# Patient Record
Sex: Female | Born: 1935
Health system: Southern US, Community
[De-identification: ages and names within clinical notes are randomized; demographics above are authoritative.]

## PROBLEM LIST (undated history)

## (undated) DIAGNOSIS — E119 Type 2 diabetes mellitus without complications: Secondary | ICD-10-CM

## (undated) DIAGNOSIS — E785 Hyperlipidemia, unspecified: Secondary | ICD-10-CM

## (undated) DIAGNOSIS — E079 Disorder of thyroid, unspecified: Secondary | ICD-10-CM

## (undated) DIAGNOSIS — I4891 Unspecified atrial fibrillation: Secondary | ICD-10-CM

## (undated) DIAGNOSIS — I10 Essential (primary) hypertension: Secondary | ICD-10-CM

## (undated) HISTORY — PX: PACEMAKER INSERTION: SHX728

## (undated) HISTORY — PX: ABDOMINAL HYSTERECTOMY: SHX81

---

## 1997-12-10 ENCOUNTER — Encounter: Payer: Self-pay | Admitting: Obstetrics & Gynecology

## 1997-12-10 ENCOUNTER — Ambulatory Visit (HOSPITAL_COMMUNITY): Admission: RE | Admit: 1997-12-10 | Discharge: 1997-12-10 | Payer: Self-pay | Admitting: Obstetrics & Gynecology

## 1998-11-24 ENCOUNTER — Other Ambulatory Visit: Admission: RE | Admit: 1998-11-24 | Discharge: 1998-11-24 | Payer: Self-pay | Admitting: Obstetrics & Gynecology

## 1998-12-13 ENCOUNTER — Ambulatory Visit (HOSPITAL_COMMUNITY): Admission: RE | Admit: 1998-12-13 | Discharge: 1998-12-13 | Payer: Self-pay | Admitting: Obstetrics & Gynecology

## 1998-12-13 ENCOUNTER — Encounter: Payer: Self-pay | Admitting: Obstetrics & Gynecology

## 1999-12-12 ENCOUNTER — Other Ambulatory Visit: Admission: RE | Admit: 1999-12-12 | Discharge: 1999-12-12 | Payer: Self-pay | Admitting: Obstetrics & Gynecology

## 1999-12-14 ENCOUNTER — Ambulatory Visit (HOSPITAL_COMMUNITY): Admission: RE | Admit: 1999-12-14 | Discharge: 1999-12-14 | Payer: Self-pay | Admitting: Obstetrics & Gynecology

## 1999-12-14 ENCOUNTER — Encounter: Payer: Self-pay | Admitting: Obstetrics & Gynecology

## 1999-12-18 ENCOUNTER — Encounter: Payer: Self-pay | Admitting: Obstetrics & Gynecology

## 1999-12-18 ENCOUNTER — Encounter: Admission: RE | Admit: 1999-12-18 | Discharge: 1999-12-18 | Payer: Self-pay | Admitting: Obstetrics & Gynecology

## 2000-12-16 ENCOUNTER — Ambulatory Visit (HOSPITAL_COMMUNITY): Admission: RE | Admit: 2000-12-16 | Discharge: 2000-12-16 | Payer: Self-pay | Admitting: Obstetrics & Gynecology

## 2000-12-16 ENCOUNTER — Encounter: Payer: Self-pay | Admitting: Obstetrics & Gynecology

## 2000-12-26 ENCOUNTER — Other Ambulatory Visit: Admission: RE | Admit: 2000-12-26 | Discharge: 2000-12-26 | Payer: Self-pay | Admitting: Obstetrics & Gynecology

## 2002-04-01 ENCOUNTER — Other Ambulatory Visit: Admission: RE | Admit: 2002-04-01 | Discharge: 2002-04-01 | Payer: Self-pay | Admitting: Obstetrics & Gynecology

## 2002-04-02 ENCOUNTER — Ambulatory Visit (HOSPITAL_COMMUNITY): Admission: RE | Admit: 2002-04-02 | Discharge: 2002-04-02 | Payer: Self-pay | Admitting: Obstetrics & Gynecology

## 2002-04-02 ENCOUNTER — Encounter: Payer: Self-pay | Admitting: Obstetrics & Gynecology

## 2003-04-05 ENCOUNTER — Other Ambulatory Visit: Admission: RE | Admit: 2003-04-05 | Discharge: 2003-04-05 | Payer: Self-pay | Admitting: *Deleted

## 2003-04-06 ENCOUNTER — Ambulatory Visit (HOSPITAL_COMMUNITY): Admission: RE | Admit: 2003-04-06 | Discharge: 2003-04-06 | Payer: Self-pay | Admitting: Obstetrics & Gynecology

## 2004-04-05 ENCOUNTER — Other Ambulatory Visit: Admission: RE | Admit: 2004-04-05 | Discharge: 2004-04-05 | Payer: Self-pay | Admitting: Obstetrics & Gynecology

## 2004-04-11 ENCOUNTER — Ambulatory Visit (HOSPITAL_COMMUNITY): Admission: RE | Admit: 2004-04-11 | Discharge: 2004-04-11 | Payer: Self-pay | Admitting: Obstetrics & Gynecology

## 2004-04-28 ENCOUNTER — Encounter: Admission: RE | Admit: 2004-04-28 | Discharge: 2004-04-28 | Payer: Self-pay | Admitting: Obstetrics & Gynecology

## 2005-04-13 ENCOUNTER — Ambulatory Visit (HOSPITAL_COMMUNITY): Admission: RE | Admit: 2005-04-13 | Discharge: 2005-04-13 | Payer: Self-pay | Admitting: Obstetrics & Gynecology

## 2005-10-15 ENCOUNTER — Ambulatory Visit (HOSPITAL_COMMUNITY): Admission: RE | Admit: 2005-10-15 | Discharge: 2005-10-16 | Payer: Self-pay | Admitting: Obstetrics & Gynecology

## 2006-04-18 ENCOUNTER — Ambulatory Visit (HOSPITAL_COMMUNITY): Admission: RE | Admit: 2006-04-18 | Discharge: 2006-04-18 | Payer: Self-pay | Admitting: Obstetrics & Gynecology

## 2007-04-21 ENCOUNTER — Ambulatory Visit (HOSPITAL_COMMUNITY): Admission: RE | Admit: 2007-04-21 | Discharge: 2007-04-21 | Payer: Self-pay | Admitting: Obstetrics & Gynecology

## 2008-04-21 ENCOUNTER — Ambulatory Visit (HOSPITAL_COMMUNITY): Admission: RE | Admit: 2008-04-21 | Discharge: 2008-04-21 | Payer: Self-pay | Admitting: Obstetrics & Gynecology

## 2009-08-26 ENCOUNTER — Ambulatory Visit (HOSPITAL_COMMUNITY): Admission: RE | Admit: 2009-08-26 | Discharge: 2009-08-26 | Payer: Self-pay | Admitting: Obstetrics & Gynecology

## 2010-09-08 NOTE — Discharge Summary (Signed)
NAME:  Kerry Carlson, Kerry Carlson NO.:  [id number]   MEDICAL RECORD NO.:  [id number]          PATIENT TYPE:  OIB   LOCATION:  9318                          FACILITY:  [facility]   PHYSICIAN:  Freddy Finner, M.D.   DATE OF BIRTH:  02/26/1926   DATE OF ADMISSION:  10/15/2005  DATE OF DISCHARGE:  10/16/2005                                 DISCHARGE SUMMARY   DISCHARGE DIAGNOSES:  1.  Cystocele with prolapse.  2.  Rectocele.  3.  Vaginal vault descensus.   OPERATIVE PROCEDURE:  Anterior and posterior colporrhaphy, sacral spinous  ligament suspension of vagina.   INTRAOPERATIVE AND POSTOPERATIVE COMPLICATIONS:  None.   DISPOSITION:  The patient is in excellent condition on the evening of the  first postoperative day.  She has voided approximately 300 mL with each  voiding with approximately 50-75 mL of residual with each of approximately 4-  6 voiding episodes.  Her suprapubic catheter was removed.  She has had  progressively increasing physical activity but no heavy lifting or vaginal  entry.  She is to call for fever or heavy vaginal bleeding.  She is to  return to the office in two weeks for postoperative follow-up.  She is to  take Percocet 5/325 as needed for postoperative pain unrelieved by Tylenol  or ibuprofen.  She is to take a regular diet.  She is to resume all of her  preoperative medications.   Details of the present illness, past history, family history, review of  systems and physical exam recorded in the admission note and/or in the  operative note.  The patient's physical findings are significant for  prolapse cystocele, vaginal vault descensus and rectocele.   LABORATORY DATA:  During this admission includes the admission hemoglobin of  13.6, postoperative hemoglobin of 10.4.  Admission pro time and PTT were  normal. Chem-7 was normal.  Urinalysis was normal.  EKG sinus bradycardia,  otherwise normal.   HOSPITAL COURSE:  The patient was admitted on the morning  of surgery.  She  was treated perioperatively with PAS compression hose.  She was given an IV  antibiotic preoperatively.  The above described surgical procedure was  accomplished without difficulty.  The patient made excellent rapid recovery  including voiding function.  By the evening of the first postoperative day,  she was ambulating without difficulty.  She was having adequate bowel and  bladder function.  She was voiding with adequate volumes and minimal  residual.  She was discharged home with disposition as noted above.      Freddy Finner, M.D.  Electronically Signed     WRN/MEDQ  D:  10/16/2005  T:  10/17/2005  Job:  [id number]

## 2010-09-08 NOTE — H&P (Signed)
NAME:  Kerry Carlson, Kerry Carlson NO.:  [id number]   MEDICAL RECORD NO.:  [id number]          PATIENT TYPE:  AMB   LOCATION:  SDC                           FACILITY:  [facility]   PHYSICIAN:  Freddy Finner, M.D.   DATE OF BIRTH:  [birth year]   DATE OF ADMISSION:  10/15/2005  DATE OF DISCHARGE:                                HISTORY & PHYSICAL   ADMITTING DIAGNOSES:  1.  Third degree cystocele with prolapse.  2.  Second degree rectocele, symptomatic.   PRESENT ILLNESS:  Patient is a 77 year old white widowed female, gravida 2,  para 2, who has been followed in my office since 1994.  She was noted to  have cystocele at her original examination at that time, but in the very  recent past this has progressively worsened to the point that she now has  prolapse through the vaginal introitus.  This has become symptomatic, and  she is very active for her age, and this is compromising her day-to-day  activities.  In addition, on examination in the office, she has a second  degree rectocele and some marked descensus of the vaginal vault.  She is now  admitted for surgical intervention, specifically anterior and posterior  colporrhaphy, sacrospinous ligament suspension of vagina.  She had a  hysterectomy in 1970.   Her current review of systems is otherwise negative.  There are no  cardiopulmonary, GI or GU complaints.   PAST MEDICAL HISTORY:  Remarkable for:  1.  Hypothyroid, medication for which she is taking Synthroid since the age      of 72.  Her current dose is 125 mcg.  2.  She also is known to be mildly hypertensive for which she takes      __________ .  3.  She also takes estrogen replacement therapy, specifically Premarin 0.625      mg a day and she has been using Premarin vaginal cream the week prior to      her surgery.   There are no other known significant medical illnesses.  HER ONLY KNOWN  ALLERGY IS TO PENICILLIN.  Previous surgical procedures, other than the  hysterectomy, are none.  She had 2 vaginal deliveries for her children.  She  does state that she had a history of endometriosis many years ago.   FAMILY HISTORY:  Noncontributory.   PHYSICAL EXAMINATION:  HEENT is grossly within normal limits.  The thyroid  gland is not palpably enlarged to my examination.  Blood pressure in the  office 140/82.  CHEST:  Clear to auscultation.  HEART:  Normal sinus rhythm without murmurs, rubs or gallops.  BREASTS:  Exam is considered to be normal.  No palpable masses.  No skin  change or retraction.  No nipple discharge.  (Most recent mammogram was  normal, in December 2006.)  ABDOMEN:  Soft and nontender without appreciable organomegaly or palpable  masses.  PELVIC:  Findings are as described in the present illness.  There is no  palpable rectal mass.  The bimanual exam reveals no palpable pelvic masses.  EXTREMITIES:  Without cyanosis, clubbing  or edema.   ASSESSMENT:  1.  Cystocele with complete prolapse.  2.  Second degree rectocele.  3.  Uterine vault descensus.   PLAN:  Vaginal anterior and posterior repair with sacrospinous ligament  suspension.      Freddy Finner, M.D.  Electronically Signed     WRN/MEDQ  D:  10/11/2005  T:  10/11/2005  Job:  [id number]

## 2010-09-08 NOTE — Op Note (Signed)
NAME:  Kerry Carlson, FLANNIGAN NO.:  [id number]   MEDICAL RECORD NO.:  [id number]          PATIENT TYPE:  AMB   LOCATION:  SDC                           FACILITY:  [facility]   PHYSICIAN:  Freddy Finner, M.D.   DATE OF BIRTH:  [birth year]   DATE OF PROCEDURE:  10/15/2005  DATE OF DISCHARGE:                                 OPERATIVE REPORT   PREOPERATIVE DIAGNOSES:  Cystocele with prolapse, rectocele, vaginal vault  descensus.   POSTOPERATIVE DIAGNOSES:  Cystocele with prolapse, rectocele, vaginal vault  descensus.   OPERATIVE PROCEDURE:  Anterior and posterior vaginal repair, sacrospinous  ligament suspension of vagina.   SURGEON:  Freddy Finner, M.D.   ANESTHESIA:  General.   ESTIMATED INTRAOPERATIVE BLOOD LOSS:  100 cc.   INTRAOPERATIVE COMPLICATIONS:  None.   HISTORY OF PRESENT ILLNESS:  Recorded in admission note.  The patient was  admitted on the morning of surgery.  She was placed in Perios hose.  She was  given an antibiotic perioperatively.  She was brought to the operating room  and there placed under adequate general anesthesia, placed in the dorsal  lithotomy position using Allen stirrups system.  Betadine prep of perineum  and vagina was carried out in the standard fashion.  Sterile drapes were  applied.  Posterior weighted vaginal retractor was placed.  The mucosa  anteriorly was grasped near the apex of the vagina in the midline with Allis  clamps.  An incision was made sharply in the midline mucosa with a scalpel.  Edges of the fundal mucosa were grasped with Allises and the incision was  developed to a distance of approximately 1 cm from the urethral meatus.  With very careful blunt and sharp dissection, the bladder and urogenital  fascia was separated from the mucosa.  Plication sutures of 0 Vicryl were  then used to approximate the vesicovaginal fascia to reduce and support the  cystocele.  Segments of mucosa were excised.  The mucosal incision was  closed with a running 2-0 Monocryl suture.  Allises were used to grasp the  fourchette on either side.  The pyramidal segment of skin was excised from  the perineal body with apex inferiorly.  The mucosa overlying the rectum was  elevated and midline incision made for a distance of approximately 8 cm.  With very careful sharp and blunt dissection, the mucosa was separated from  the rectum and perirectal tissues.  The sacrospinous ligament on the right  was then palpably identified using the Capio device and an absorbable  suture.  Then, a sacrospinous suture was placed and tacked to the vaginal  mucosa near the apex of the vagina.  Plication sutures were then used to  approximate the perirectal tissues in the midline and recreate a  rectovaginal septum.  The mucosa was closed for a distance of approximately  4 to 5 cm before tying the sacrospinous ligament suspension suture.  This  adequately elevated the vaginal vault.  Deep sutures of 0 Monocryl were used  in the perineal body to approximate the levators and close the perineal  body.  The mucosa was completely closed with running 0 Monocryl suture and  the perineum  closed in fashion similar to episiotomy.  Bladder was then filled with 300  cc of irrigating solution.  Suprapubic Bonnano catheter was then placed  without difficulty and anchored to the skin using 3-0 nylon sutures.  The  patient was then awakened and taken to the recovery room in good condition.      Freddy Finner, M.D.  Electronically Signed     WRN/MEDQ  D:  10/15/2005  T:  10/15/2005  Job:  [id number]

## 2010-09-19 ENCOUNTER — Other Ambulatory Visit (HOSPITAL_COMMUNITY): Payer: Self-pay | Admitting: Obstetrics & Gynecology

## 2010-09-19 DIAGNOSIS — Z1231 Encounter for screening mammogram for malignant neoplasm of breast: Secondary | ICD-10-CM

## 2010-09-28 ENCOUNTER — Ambulatory Visit (HOSPITAL_COMMUNITY)
Admission: RE | Admit: 2010-09-28 | Discharge: 2010-09-28 | Disposition: A | Discharge status: Home / Self Care | Payer: Medicare Other | Source: Ambulatory Visit | Attending: Obstetrics & Gynecology | Admitting: Obstetrics & Gynecology

## 2010-09-28 DIAGNOSIS — Z1231 Encounter for screening mammogram for malignant neoplasm of breast: Secondary | ICD-10-CM | POA: Insufficient documentation

## 2011-04-26 DIAGNOSIS — I10 Essential (primary) hypertension: Secondary | ICD-10-CM | POA: Diagnosis not present

## 2011-04-26 DIAGNOSIS — M1990 Unspecified osteoarthritis, unspecified site: Secondary | ICD-10-CM | POA: Diagnosis not present

## 2011-04-26 DIAGNOSIS — E039 Hypothyroidism, unspecified: Secondary | ICD-10-CM | POA: Diagnosis not present

## 2011-04-26 DIAGNOSIS — E785 Hyperlipidemia, unspecified: Secondary | ICD-10-CM | POA: Diagnosis not present

## 2011-10-10 DIAGNOSIS — N951 Menopausal and female climacteric states: Secondary | ICD-10-CM | POA: Diagnosis not present

## 2011-10-11 ENCOUNTER — Other Ambulatory Visit (HOSPITAL_COMMUNITY): Payer: Self-pay | Admitting: Obstetrics & Gynecology

## 2011-10-11 DIAGNOSIS — Z1231 Encounter for screening mammogram for malignant neoplasm of breast: Secondary | ICD-10-CM

## 2011-11-02 ENCOUNTER — Ambulatory Visit (HOSPITAL_COMMUNITY)
Admission: RE | Admit: 2011-11-02 | Discharge: 2011-11-02 | Disposition: A | Discharge status: Home / Self Care | Payer: Medicare Other | Source: Ambulatory Visit | Attending: Obstetrics & Gynecology | Admitting: Obstetrics & Gynecology

## 2011-11-02 DIAGNOSIS — Z1231 Encounter for screening mammogram for malignant neoplasm of breast: Secondary | ICD-10-CM | POA: Insufficient documentation

## 2011-11-07 DIAGNOSIS — Z961 Presence of intraocular lens: Secondary | ICD-10-CM | POA: Diagnosis not present

## 2011-11-07 DIAGNOSIS — H43819 Vitreous degeneration, unspecified eye: Secondary | ICD-10-CM | POA: Diagnosis not present

## 2011-11-07 DIAGNOSIS — H52209 Unspecified astigmatism, unspecified eye: Secondary | ICD-10-CM | POA: Diagnosis not present

## 2011-11-07 DIAGNOSIS — H04129 Dry eye syndrome of unspecified lacrimal gland: Secondary | ICD-10-CM | POA: Diagnosis not present

## 2011-11-26 DIAGNOSIS — I10 Essential (primary) hypertension: Secondary | ICD-10-CM | POA: Diagnosis not present

## 2011-11-26 DIAGNOSIS — E785 Hyperlipidemia, unspecified: Secondary | ICD-10-CM | POA: Diagnosis not present

## 2011-11-26 DIAGNOSIS — R7301 Impaired fasting glucose: Secondary | ICD-10-CM | POA: Diagnosis not present

## 2011-11-26 DIAGNOSIS — E039 Hypothyroidism, unspecified: Secondary | ICD-10-CM | POA: Diagnosis not present

## 2011-12-03 DIAGNOSIS — E785 Hyperlipidemia, unspecified: Secondary | ICD-10-CM | POA: Diagnosis not present

## 2011-12-03 DIAGNOSIS — I10 Essential (primary) hypertension: Secondary | ICD-10-CM | POA: Diagnosis not present

## 2011-12-03 DIAGNOSIS — E039 Hypothyroidism, unspecified: Secondary | ICD-10-CM | POA: Diagnosis not present

## 2011-12-03 DIAGNOSIS — Z0000 Encounter for general adult medical examination without abnormal findings: Secondary | ICD-10-CM | POA: Diagnosis not present

## 2012-05-16 DIAGNOSIS — R42 Dizziness and giddiness: Secondary | ICD-10-CM | POA: Diagnosis not present

## 2012-05-16 DIAGNOSIS — G8190 Hemiplegia, unspecified affecting unspecified side: Secondary | ICD-10-CM | POA: Diagnosis not present

## 2012-05-16 DIAGNOSIS — G9389 Other specified disorders of brain: Secondary | ICD-10-CM | POA: Diagnosis not present

## 2012-08-26 DIAGNOSIS — R319 Hematuria, unspecified: Secondary | ICD-10-CM | POA: Diagnosis not present

## 2012-08-26 DIAGNOSIS — N3000 Acute cystitis without hematuria: Secondary | ICD-10-CM | POA: Diagnosis not present

## 2012-08-26 DIAGNOSIS — N390 Urinary tract infection, site not specified: Secondary | ICD-10-CM | POA: Diagnosis not present

## 2012-08-26 DIAGNOSIS — R300 Dysuria: Secondary | ICD-10-CM | POA: Diagnosis not present

## 2012-09-11 DIAGNOSIS — E039 Hypothyroidism, unspecified: Secondary | ICD-10-CM | POA: Diagnosis not present

## 2012-09-11 DIAGNOSIS — E785 Hyperlipidemia, unspecified: Secondary | ICD-10-CM | POA: Diagnosis not present

## 2012-09-11 DIAGNOSIS — I10 Essential (primary) hypertension: Secondary | ICD-10-CM | POA: Diagnosis not present

## 2012-10-07 ENCOUNTER — Other Ambulatory Visit (HOSPITAL_COMMUNITY): Payer: Self-pay | Admitting: Internal Medicine

## 2012-10-07 DIAGNOSIS — Z1231 Encounter for screening mammogram for malignant neoplasm of breast: Secondary | ICD-10-CM

## 2012-10-07 DIAGNOSIS — Z124 Encounter for screening for malignant neoplasm of cervix: Secondary | ICD-10-CM | POA: Diagnosis not present

## 2012-11-04 ENCOUNTER — Ambulatory Visit (HOSPITAL_COMMUNITY)
Admission: RE | Admit: 2012-11-04 | Discharge: 2012-11-04 | Disposition: A | Discharge status: Home / Self Care | Payer: Medicare Other | Source: Ambulatory Visit | Attending: Internal Medicine | Admitting: Internal Medicine

## 2012-11-04 DIAGNOSIS — Z1231 Encounter for screening mammogram for malignant neoplasm of breast: Secondary | ICD-10-CM | POA: Insufficient documentation

## 2012-11-05 DIAGNOSIS — H52209 Unspecified astigmatism, unspecified eye: Secondary | ICD-10-CM | POA: Diagnosis not present

## 2012-11-05 DIAGNOSIS — H43819 Vitreous degeneration, unspecified eye: Secondary | ICD-10-CM | POA: Diagnosis not present

## 2012-11-05 DIAGNOSIS — Z961 Presence of intraocular lens: Secondary | ICD-10-CM | POA: Diagnosis not present

## 2012-11-05 DIAGNOSIS — H04129 Dry eye syndrome of unspecified lacrimal gland: Secondary | ICD-10-CM | POA: Diagnosis not present

## 2012-11-25 DIAGNOSIS — E039 Hypothyroidism, unspecified: Secondary | ICD-10-CM | POA: Diagnosis not present

## 2012-11-25 DIAGNOSIS — Z780 Asymptomatic menopausal state: Secondary | ICD-10-CM | POA: Diagnosis not present

## 2012-11-25 DIAGNOSIS — E785 Hyperlipidemia, unspecified: Secondary | ICD-10-CM | POA: Diagnosis not present

## 2012-11-25 DIAGNOSIS — R7301 Impaired fasting glucose: Secondary | ICD-10-CM | POA: Diagnosis not present

## 2012-11-25 DIAGNOSIS — I10 Essential (primary) hypertension: Secondary | ICD-10-CM | POA: Diagnosis not present

## 2012-12-01 DIAGNOSIS — M1710 Unilateral primary osteoarthritis, unspecified knee: Secondary | ICD-10-CM | POA: Diagnosis not present

## 2012-12-04 DIAGNOSIS — E039 Hypothyroidism, unspecified: Secondary | ICD-10-CM | POA: Diagnosis not present

## 2012-12-04 DIAGNOSIS — Z0000 Encounter for general adult medical examination without abnormal findings: Secondary | ICD-10-CM | POA: Diagnosis not present

## 2012-12-04 DIAGNOSIS — R7301 Impaired fasting glucose: Secondary | ICD-10-CM | POA: Diagnosis not present

## 2012-12-04 DIAGNOSIS — Z1331 Encounter for screening for depression: Secondary | ICD-10-CM | POA: Diagnosis not present

## 2012-12-04 DIAGNOSIS — I679 Cerebrovascular disease, unspecified: Secondary | ICD-10-CM | POA: Diagnosis not present

## 2012-12-04 DIAGNOSIS — I10 Essential (primary) hypertension: Secondary | ICD-10-CM | POA: Diagnosis not present

## 2012-12-04 DIAGNOSIS — E785 Hyperlipidemia, unspecified: Secondary | ICD-10-CM | POA: Diagnosis not present

## 2012-12-08 DIAGNOSIS — Z1212 Encounter for screening for malignant neoplasm of rectum: Secondary | ICD-10-CM | POA: Diagnosis not present

## 2013-01-05 DIAGNOSIS — M25569 Pain in unspecified knee: Secondary | ICD-10-CM | POA: Diagnosis not present

## 2013-04-09 DIAGNOSIS — M1710 Unilateral primary osteoarthritis, unspecified knee: Secondary | ICD-10-CM | POA: Diagnosis not present

## 2013-05-13 DIAGNOSIS — J209 Acute bronchitis, unspecified: Secondary | ICD-10-CM | POA: Diagnosis not present

## 2013-09-09 ENCOUNTER — Other Ambulatory Visit (HOSPITAL_COMMUNITY): Payer: Self-pay | Admitting: Internal Medicine

## 2013-09-09 DIAGNOSIS — Z1231 Encounter for screening mammogram for malignant neoplasm of breast: Secondary | ICD-10-CM

## 2013-11-02 DIAGNOSIS — N3945 Continuous leakage: Secondary | ICD-10-CM | POA: Diagnosis not present

## 2013-11-02 DIAGNOSIS — Z01419 Encounter for gynecological examination (general) (routine) without abnormal findings: Secondary | ICD-10-CM | POA: Diagnosis not present

## 2013-11-05 ENCOUNTER — Ambulatory Visit (HOSPITAL_COMMUNITY)
Admission: RE | Admit: 2013-11-05 | Discharge: 2013-11-05 | Disposition: A | Discharge status: Home / Self Care | Payer: Medicare Other | Source: Ambulatory Visit | Attending: Internal Medicine | Admitting: Internal Medicine

## 2013-11-05 DIAGNOSIS — Z1231 Encounter for screening mammogram for malignant neoplasm of breast: Secondary | ICD-10-CM | POA: Insufficient documentation

## 2013-11-09 DIAGNOSIS — H33329 Round hole, unspecified eye: Secondary | ICD-10-CM | POA: Diagnosis not present

## 2013-11-09 DIAGNOSIS — Z961 Presence of intraocular lens: Secondary | ICD-10-CM | POA: Diagnosis not present

## 2013-11-09 DIAGNOSIS — H04129 Dry eye syndrome of unspecified lacrimal gland: Secondary | ICD-10-CM | POA: Diagnosis not present

## 2013-11-09 DIAGNOSIS — H43819 Vitreous degeneration, unspecified eye: Secondary | ICD-10-CM | POA: Diagnosis not present

## 2013-12-01 DIAGNOSIS — I10 Essential (primary) hypertension: Secondary | ICD-10-CM | POA: Diagnosis not present

## 2013-12-01 DIAGNOSIS — E785 Hyperlipidemia, unspecified: Secondary | ICD-10-CM | POA: Diagnosis not present

## 2013-12-01 DIAGNOSIS — R7301 Impaired fasting glucose: Secondary | ICD-10-CM | POA: Diagnosis not present

## 2013-12-01 DIAGNOSIS — E039 Hypothyroidism, unspecified: Secondary | ICD-10-CM | POA: Diagnosis not present

## 2013-12-01 DIAGNOSIS — R82998 Other abnormal findings in urine: Secondary | ICD-10-CM | POA: Diagnosis not present

## 2013-12-03 DIAGNOSIS — H33009 Unspecified retinal detachment with retinal break, unspecified eye: Secondary | ICD-10-CM | POA: Diagnosis not present

## 2013-12-07 DIAGNOSIS — Z1212 Encounter for screening for malignant neoplasm of rectum: Secondary | ICD-10-CM | POA: Diagnosis not present

## 2013-12-08 DIAGNOSIS — I679 Cerebrovascular disease, unspecified: Secondary | ICD-10-CM | POA: Diagnosis not present

## 2013-12-08 DIAGNOSIS — M1990 Unspecified osteoarthritis, unspecified site: Secondary | ICD-10-CM | POA: Diagnosis not present

## 2013-12-08 DIAGNOSIS — I10 Essential (primary) hypertension: Secondary | ICD-10-CM | POA: Diagnosis not present

## 2013-12-08 DIAGNOSIS — R7301 Impaired fasting glucose: Secondary | ICD-10-CM | POA: Diagnosis not present

## 2013-12-08 DIAGNOSIS — Z0000 Encounter for general adult medical examination without abnormal findings: Secondary | ICD-10-CM | POA: Diagnosis not present

## 2013-12-08 DIAGNOSIS — N183 Chronic kidney disease, stage 3 unspecified: Secondary | ICD-10-CM | POA: Diagnosis not present

## 2013-12-08 DIAGNOSIS — E039 Hypothyroidism, unspecified: Secondary | ICD-10-CM | POA: Diagnosis not present

## 2013-12-08 DIAGNOSIS — E785 Hyperlipidemia, unspecified: Secondary | ICD-10-CM | POA: Diagnosis not present

## 2013-12-23 DIAGNOSIS — H43819 Vitreous degeneration, unspecified eye: Secondary | ICD-10-CM | POA: Diagnosis not present

## 2013-12-23 DIAGNOSIS — H33309 Unspecified retinal break, unspecified eye: Secondary | ICD-10-CM | POA: Diagnosis not present

## 2014-04-08 DIAGNOSIS — H33302 Unspecified retinal break, left eye: Secondary | ICD-10-CM | POA: Diagnosis not present

## 2014-04-08 DIAGNOSIS — H43813 Vitreous degeneration, bilateral: Secondary | ICD-10-CM | POA: Diagnosis not present

## 2014-10-12 ENCOUNTER — Other Ambulatory Visit (HOSPITAL_COMMUNITY): Payer: Self-pay | Admitting: Internal Medicine

## 2014-10-12 DIAGNOSIS — Z1231 Encounter for screening mammogram for malignant neoplasm of breast: Secondary | ICD-10-CM

## 2014-10-14 DIAGNOSIS — H43813 Vitreous degeneration, bilateral: Secondary | ICD-10-CM | POA: Diagnosis not present

## 2014-10-14 DIAGNOSIS — H33012 Retinal detachment with single break, left eye: Secondary | ICD-10-CM | POA: Diagnosis not present

## 2014-11-09 ENCOUNTER — Ambulatory Visit (HOSPITAL_COMMUNITY)
Admission: RE | Admit: 2014-11-09 | Discharge: 2014-11-09 | Disposition: A | Discharge status: Home / Self Care | Payer: Medicare Other | Source: Ambulatory Visit | Attending: Internal Medicine | Admitting: Internal Medicine

## 2014-11-09 DIAGNOSIS — Z1231 Encounter for screening mammogram for malignant neoplasm of breast: Secondary | ICD-10-CM | POA: Insufficient documentation

## 2014-11-10 DIAGNOSIS — N390 Urinary tract infection, site not specified: Secondary | ICD-10-CM | POA: Diagnosis not present

## 2014-11-10 DIAGNOSIS — Z1272 Encounter for screening for malignant neoplasm of vagina: Secondary | ICD-10-CM | POA: Diagnosis not present

## 2014-11-10 DIAGNOSIS — Z124 Encounter for screening for malignant neoplasm of cervix: Secondary | ICD-10-CM | POA: Diagnosis not present

## 2014-11-10 DIAGNOSIS — N3945 Continuous leakage: Secondary | ICD-10-CM | POA: Diagnosis not present

## 2014-11-10 DIAGNOSIS — Z6827 Body mass index (BMI) 27.0-27.9, adult: Secondary | ICD-10-CM | POA: Diagnosis not present

## 2014-11-10 DIAGNOSIS — R3915 Urgency of urination: Secondary | ICD-10-CM | POA: Diagnosis not present

## 2014-11-15 DIAGNOSIS — H52203 Unspecified astigmatism, bilateral: Secondary | ICD-10-CM | POA: Diagnosis not present

## 2014-11-15 DIAGNOSIS — Z961 Presence of intraocular lens: Secondary | ICD-10-CM | POA: Diagnosis not present

## 2014-11-15 DIAGNOSIS — H31002 Unspecified chorioretinal scars, left eye: Secondary | ICD-10-CM | POA: Diagnosis not present

## 2014-11-15 DIAGNOSIS — H43813 Vitreous degeneration, bilateral: Secondary | ICD-10-CM | POA: Diagnosis not present

## 2014-11-30 DIAGNOSIS — N9089 Other specified noninflammatory disorders of vulva and perineum: Secondary | ICD-10-CM | POA: Diagnosis not present

## 2014-12-06 DIAGNOSIS — R7302 Impaired glucose tolerance (oral): Secondary | ICD-10-CM | POA: Diagnosis not present

## 2014-12-06 DIAGNOSIS — I10 Essential (primary) hypertension: Secondary | ICD-10-CM | POA: Diagnosis not present

## 2014-12-06 DIAGNOSIS — E785 Hyperlipidemia, unspecified: Secondary | ICD-10-CM | POA: Diagnosis not present

## 2014-12-06 DIAGNOSIS — E039 Hypothyroidism, unspecified: Secondary | ICD-10-CM | POA: Diagnosis not present

## 2014-12-07 DIAGNOSIS — Z09 Encounter for follow-up examination after completed treatment for conditions other than malignant neoplasm: Secondary | ICD-10-CM | POA: Diagnosis not present

## 2014-12-13 DIAGNOSIS — E785 Hyperlipidemia, unspecified: Secondary | ICD-10-CM | POA: Diagnosis not present

## 2014-12-13 DIAGNOSIS — E039 Hypothyroidism, unspecified: Secondary | ICD-10-CM | POA: Diagnosis not present

## 2014-12-13 DIAGNOSIS — M1990 Unspecified osteoarthritis, unspecified site: Secondary | ICD-10-CM | POA: Diagnosis not present

## 2014-12-13 DIAGNOSIS — Z0000 Encounter for general adult medical examination without abnormal findings: Secondary | ICD-10-CM | POA: Diagnosis not present

## 2014-12-13 DIAGNOSIS — Z1389 Encounter for screening for other disorder: Secondary | ICD-10-CM | POA: Diagnosis not present

## 2014-12-13 DIAGNOSIS — I679 Cerebrovascular disease, unspecified: Secondary | ICD-10-CM | POA: Diagnosis not present

## 2014-12-13 DIAGNOSIS — H3320 Serous retinal detachment, unspecified eye: Secondary | ICD-10-CM | POA: Diagnosis not present

## 2014-12-13 DIAGNOSIS — N183 Chronic kidney disease, stage 3 (moderate): Secondary | ICD-10-CM | POA: Diagnosis not present

## 2014-12-13 DIAGNOSIS — I129 Hypertensive chronic kidney disease with stage 1 through stage 4 chronic kidney disease, or unspecified chronic kidney disease: Secondary | ICD-10-CM | POA: Diagnosis not present

## 2014-12-20 DIAGNOSIS — Z1212 Encounter for screening for malignant neoplasm of rectum: Secondary | ICD-10-CM | POA: Diagnosis not present

## 2015-05-09 DIAGNOSIS — N390 Urinary tract infection, site not specified: Secondary | ICD-10-CM | POA: Diagnosis not present

## 2015-06-21 DIAGNOSIS — J209 Acute bronchitis, unspecified: Secondary | ICD-10-CM | POA: Diagnosis not present

## 2015-06-21 DIAGNOSIS — I4891 Unspecified atrial fibrillation: Secondary | ICD-10-CM | POA: Diagnosis not present

## 2015-06-28 DIAGNOSIS — I4891 Unspecified atrial fibrillation: Secondary | ICD-10-CM | POA: Diagnosis not present

## 2015-06-28 DIAGNOSIS — I10 Essential (primary) hypertension: Secondary | ICD-10-CM | POA: Diagnosis not present

## 2015-06-29 DIAGNOSIS — R5381 Other malaise: Secondary | ICD-10-CM | POA: Diagnosis not present

## 2015-06-29 DIAGNOSIS — Q791 Other congenital malformations of diaphragm: Secondary | ICD-10-CM | POA: Diagnosis not present

## 2015-06-29 DIAGNOSIS — I700 Atherosclerosis of aorta: Secondary | ICD-10-CM | POA: Diagnosis not present

## 2015-06-29 DIAGNOSIS — R05 Cough: Secondary | ICD-10-CM | POA: Diagnosis not present

## 2015-06-29 DIAGNOSIS — M419 Scoliosis, unspecified: Secondary | ICD-10-CM | POA: Diagnosis not present

## 2015-06-29 DIAGNOSIS — I4891 Unspecified atrial fibrillation: Secondary | ICD-10-CM | POA: Diagnosis not present

## 2015-06-29 DIAGNOSIS — I10 Essential (primary) hypertension: Secondary | ICD-10-CM | POA: Diagnosis not present

## 2015-07-05 DIAGNOSIS — R9431 Abnormal electrocardiogram [ECG] [EKG]: Secondary | ICD-10-CM | POA: Diagnosis not present

## 2015-07-05 DIAGNOSIS — I10 Essential (primary) hypertension: Secondary | ICD-10-CM | POA: Diagnosis not present

## 2015-07-05 DIAGNOSIS — I4891 Unspecified atrial fibrillation: Secondary | ICD-10-CM | POA: Diagnosis not present

## 2015-07-07 DIAGNOSIS — I4891 Unspecified atrial fibrillation: Secondary | ICD-10-CM | POA: Diagnosis not present

## 2015-07-07 DIAGNOSIS — R9431 Abnormal electrocardiogram [ECG] [EKG]: Secondary | ICD-10-CM | POA: Diagnosis not present

## 2015-07-14 DIAGNOSIS — I10 Essential (primary) hypertension: Secondary | ICD-10-CM | POA: Diagnosis not present

## 2015-07-14 DIAGNOSIS — I4891 Unspecified atrial fibrillation: Secondary | ICD-10-CM | POA: Diagnosis not present

## 2015-07-19 DIAGNOSIS — I4891 Unspecified atrial fibrillation: Secondary | ICD-10-CM | POA: Diagnosis not present

## 2015-07-19 DIAGNOSIS — E039 Hypothyroidism, unspecified: Secondary | ICD-10-CM | POA: Diagnosis not present

## 2015-11-01 DIAGNOSIS — H33322 Round hole, left eye: Secondary | ICD-10-CM | POA: Diagnosis not present

## 2015-11-01 DIAGNOSIS — H43813 Vitreous degeneration, bilateral: Secondary | ICD-10-CM | POA: Diagnosis not present

## 2015-11-24 DIAGNOSIS — E039 Hypothyroidism, unspecified: Secondary | ICD-10-CM | POA: Diagnosis not present

## 2015-11-24 DIAGNOSIS — I4891 Unspecified atrial fibrillation: Secondary | ICD-10-CM | POA: Diagnosis not present

## 2015-11-24 DIAGNOSIS — E785 Hyperlipidemia, unspecified: Secondary | ICD-10-CM | POA: Diagnosis not present

## 2016-01-19 DIAGNOSIS — E785 Hyperlipidemia, unspecified: Secondary | ICD-10-CM | POA: Diagnosis not present

## 2016-01-19 DIAGNOSIS — I4891 Unspecified atrial fibrillation: Secondary | ICD-10-CM | POA: Diagnosis not present

## 2016-01-19 DIAGNOSIS — I10 Essential (primary) hypertension: Secondary | ICD-10-CM | POA: Diagnosis not present

## 2016-05-29 DIAGNOSIS — E039 Hypothyroidism, unspecified: Secondary | ICD-10-CM | POA: Diagnosis not present

## 2016-05-29 DIAGNOSIS — I5189 Other ill-defined heart diseases: Secondary | ICD-10-CM | POA: Diagnosis not present

## 2016-05-29 DIAGNOSIS — M1990 Unspecified osteoarthritis, unspecified site: Secondary | ICD-10-CM | POA: Diagnosis not present

## 2016-05-29 DIAGNOSIS — I4891 Unspecified atrial fibrillation: Secondary | ICD-10-CM | POA: Diagnosis not present

## 2016-05-29 DIAGNOSIS — E785 Hyperlipidemia, unspecified: Secondary | ICD-10-CM | POA: Diagnosis not present

## 2016-05-29 DIAGNOSIS — I10 Essential (primary) hypertension: Secondary | ICD-10-CM | POA: Diagnosis not present

## 2016-06-18 DIAGNOSIS — E039 Hypothyroidism, unspecified: Secondary | ICD-10-CM | POA: Diagnosis not present

## 2016-06-18 DIAGNOSIS — E7800 Pure hypercholesterolemia, unspecified: Secondary | ICD-10-CM | POA: Diagnosis not present

## 2016-06-18 DIAGNOSIS — R079 Chest pain, unspecified: Secondary | ICD-10-CM | POA: Diagnosis not present

## 2016-06-18 DIAGNOSIS — I499 Cardiac arrhythmia, unspecified: Secondary | ICD-10-CM | POA: Diagnosis not present

## 2016-06-18 DIAGNOSIS — Z79899 Other long term (current) drug therapy: Secondary | ICD-10-CM | POA: Diagnosis not present

## 2016-06-18 DIAGNOSIS — Z7901 Long term (current) use of anticoagulants: Secondary | ICD-10-CM | POA: Diagnosis not present

## 2016-06-18 DIAGNOSIS — J40 Bronchitis, not specified as acute or chronic: Secondary | ICD-10-CM | POA: Diagnosis not present

## 2016-07-02 DIAGNOSIS — E039 Hypothyroidism, unspecified: Secondary | ICD-10-CM | POA: Diagnosis not present

## 2016-07-02 DIAGNOSIS — I4891 Unspecified atrial fibrillation: Secondary | ICD-10-CM | POA: Diagnosis not present

## 2016-07-02 DIAGNOSIS — E785 Hyperlipidemia, unspecified: Secondary | ICD-10-CM | POA: Diagnosis not present

## 2016-07-02 DIAGNOSIS — I10 Essential (primary) hypertension: Secondary | ICD-10-CM | POA: Diagnosis not present

## 2016-07-05 DIAGNOSIS — E785 Hyperlipidemia, unspecified: Secondary | ICD-10-CM | POA: Diagnosis not present

## 2016-07-05 DIAGNOSIS — I4891 Unspecified atrial fibrillation: Secondary | ICD-10-CM | POA: Diagnosis not present

## 2016-07-05 DIAGNOSIS — E039 Hypothyroidism, unspecified: Secondary | ICD-10-CM | POA: Diagnosis not present

## 2016-07-09 DIAGNOSIS — E876 Hypokalemia: Secondary | ICD-10-CM | POA: Diagnosis not present

## 2016-07-11 DIAGNOSIS — I2729 Other secondary pulmonary hypertension: Secondary | ICD-10-CM | POA: Diagnosis not present

## 2016-07-11 DIAGNOSIS — F0781 Postconcussional syndrome: Secondary | ICD-10-CM | POA: Diagnosis not present

## 2016-07-11 DIAGNOSIS — I4891 Unspecified atrial fibrillation: Secondary | ICD-10-CM | POA: Diagnosis not present

## 2016-07-11 DIAGNOSIS — S199XXA Unspecified injury of neck, initial encounter: Secondary | ICD-10-CM | POA: Diagnosis not present

## 2016-07-11 DIAGNOSIS — E039 Hypothyroidism, unspecified: Secondary | ICD-10-CM | POA: Diagnosis not present

## 2016-07-11 DIAGNOSIS — S069X9A Unspecified intracranial injury with loss of consciousness of unspecified duration, initial encounter: Secondary | ICD-10-CM | POA: Diagnosis not present

## 2016-07-11 DIAGNOSIS — I10 Essential (primary) hypertension: Secondary | ICD-10-CM | POA: Diagnosis not present

## 2016-07-11 DIAGNOSIS — Z880 Allergy status to penicillin: Secondary | ICD-10-CM | POA: Diagnosis not present

## 2016-07-11 DIAGNOSIS — R030 Elevated blood-pressure reading, without diagnosis of hypertension: Secondary | ICD-10-CM | POA: Diagnosis not present

## 2016-07-11 DIAGNOSIS — S298XXA Other specified injuries of thorax, initial encounter: Secondary | ICD-10-CM | POA: Diagnosis not present

## 2016-07-11 DIAGNOSIS — G039 Meningitis, unspecified: Secondary | ICD-10-CM | POA: Diagnosis not present

## 2016-07-11 DIAGNOSIS — I6789 Other cerebrovascular disease: Secondary | ICD-10-CM | POA: Diagnosis not present

## 2016-07-11 DIAGNOSIS — I482 Chronic atrial fibrillation: Secondary | ICD-10-CM | POA: Diagnosis not present

## 2016-07-11 DIAGNOSIS — E785 Hyperlipidemia, unspecified: Secondary | ICD-10-CM | POA: Diagnosis not present

## 2016-07-11 DIAGNOSIS — S0101XA Laceration without foreign body of scalp, initial encounter: Secondary | ICD-10-CM | POA: Diagnosis present

## 2016-07-11 DIAGNOSIS — R55 Syncope and collapse: Secondary | ICD-10-CM | POA: Diagnosis not present

## 2016-07-11 DIAGNOSIS — Z90710 Acquired absence of both cervix and uterus: Secondary | ICD-10-CM | POA: Diagnosis not present

## 2016-07-11 DIAGNOSIS — I469 Cardiac arrest, cause unspecified: Secondary | ICD-10-CM | POA: Diagnosis not present

## 2016-07-11 DIAGNOSIS — R079 Chest pain, unspecified: Secondary | ICD-10-CM | POA: Diagnosis not present

## 2016-07-11 DIAGNOSIS — E1165 Type 2 diabetes mellitus with hyperglycemia: Secondary | ICD-10-CM | POA: Diagnosis not present

## 2016-07-11 DIAGNOSIS — R002 Palpitations: Secondary | ICD-10-CM | POA: Diagnosis not present

## 2016-07-11 DIAGNOSIS — S098XXA Other specified injuries of head, initial encounter: Secondary | ICD-10-CM | POA: Diagnosis not present

## 2016-07-11 DIAGNOSIS — Z743 Need for continuous supervision: Secondary | ICD-10-CM | POA: Diagnosis not present

## 2016-07-11 DIAGNOSIS — S0990XA Unspecified injury of head, initial encounter: Secondary | ICD-10-CM | POA: Diagnosis not present

## 2016-07-11 DIAGNOSIS — R42 Dizziness and giddiness: Secondary | ICD-10-CM | POA: Diagnosis not present

## 2016-07-19 DIAGNOSIS — I4891 Unspecified atrial fibrillation: Secondary | ICD-10-CM | POA: Diagnosis not present

## 2016-07-19 DIAGNOSIS — R269 Unspecified abnormalities of gait and mobility: Secondary | ICD-10-CM | POA: Diagnosis not present

## 2016-07-19 DIAGNOSIS — E1169 Type 2 diabetes mellitus with other specified complication: Secondary | ICD-10-CM | POA: Diagnosis not present

## 2016-07-19 DIAGNOSIS — R55 Syncope and collapse: Secondary | ICD-10-CM | POA: Diagnosis not present

## 2016-07-19 DIAGNOSIS — W19XXXD Unspecified fall, subsequent encounter: Secondary | ICD-10-CM | POA: Diagnosis not present

## 2016-07-19 DIAGNOSIS — Z95828 Presence of other vascular implants and grafts: Secondary | ICD-10-CM | POA: Diagnosis not present

## 2016-07-23 DIAGNOSIS — I4891 Unspecified atrial fibrillation: Secondary | ICD-10-CM | POA: Diagnosis not present

## 2016-07-23 DIAGNOSIS — W19XXXD Unspecified fall, subsequent encounter: Secondary | ICD-10-CM | POA: Diagnosis not present

## 2016-07-23 DIAGNOSIS — R269 Unspecified abnormalities of gait and mobility: Secondary | ICD-10-CM | POA: Diagnosis not present

## 2016-07-23 DIAGNOSIS — Z95828 Presence of other vascular implants and grafts: Secondary | ICD-10-CM | POA: Diagnosis not present

## 2016-07-23 DIAGNOSIS — R55 Syncope and collapse: Secondary | ICD-10-CM | POA: Diagnosis not present

## 2016-07-23 DIAGNOSIS — E1169 Type 2 diabetes mellitus with other specified complication: Secondary | ICD-10-CM | POA: Diagnosis not present

## 2016-07-24 DIAGNOSIS — R55 Syncope and collapse: Secondary | ICD-10-CM | POA: Diagnosis not present

## 2016-07-24 DIAGNOSIS — W19XXXD Unspecified fall, subsequent encounter: Secondary | ICD-10-CM | POA: Diagnosis not present

## 2016-07-24 DIAGNOSIS — I4891 Unspecified atrial fibrillation: Secondary | ICD-10-CM | POA: Diagnosis not present

## 2016-07-24 DIAGNOSIS — E1169 Type 2 diabetes mellitus with other specified complication: Secondary | ICD-10-CM | POA: Diagnosis not present

## 2016-07-24 DIAGNOSIS — R269 Unspecified abnormalities of gait and mobility: Secondary | ICD-10-CM | POA: Diagnosis not present

## 2016-07-24 DIAGNOSIS — Z95828 Presence of other vascular implants and grafts: Secondary | ICD-10-CM | POA: Diagnosis not present

## 2016-07-26 DIAGNOSIS — E1169 Type 2 diabetes mellitus with other specified complication: Secondary | ICD-10-CM | POA: Diagnosis not present

## 2016-07-26 DIAGNOSIS — I4891 Unspecified atrial fibrillation: Secondary | ICD-10-CM | POA: Diagnosis not present

## 2016-07-26 DIAGNOSIS — R269 Unspecified abnormalities of gait and mobility: Secondary | ICD-10-CM | POA: Diagnosis not present

## 2016-07-26 DIAGNOSIS — R55 Syncope and collapse: Secondary | ICD-10-CM | POA: Diagnosis not present

## 2016-07-26 DIAGNOSIS — Z95828 Presence of other vascular implants and grafts: Secondary | ICD-10-CM | POA: Diagnosis not present

## 2016-07-26 DIAGNOSIS — W19XXXD Unspecified fall, subsequent encounter: Secondary | ICD-10-CM | POA: Diagnosis not present

## 2016-07-27 DIAGNOSIS — Z950 Presence of cardiac pacemaker: Secondary | ICD-10-CM | POA: Diagnosis not present

## 2016-07-27 DIAGNOSIS — I482 Chronic atrial fibrillation: Secondary | ICD-10-CM | POA: Diagnosis not present

## 2016-07-27 DIAGNOSIS — R42 Dizziness and giddiness: Secondary | ICD-10-CM | POA: Diagnosis not present

## 2016-07-27 DIAGNOSIS — E1142 Type 2 diabetes mellitus with diabetic polyneuropathy: Secondary | ICD-10-CM | POA: Diagnosis not present

## 2016-07-27 DIAGNOSIS — E039 Hypothyroidism, unspecified: Secondary | ICD-10-CM | POA: Diagnosis not present

## 2016-07-27 DIAGNOSIS — S0101XA Laceration without foreign body of scalp, initial encounter: Secondary | ICD-10-CM | POA: Diagnosis not present

## 2016-07-27 DIAGNOSIS — I10 Essential (primary) hypertension: Secondary | ICD-10-CM | POA: Diagnosis not present

## 2016-07-27 DIAGNOSIS — E785 Hyperlipidemia, unspecified: Secondary | ICD-10-CM | POA: Diagnosis not present

## 2016-07-30 DIAGNOSIS — R269 Unspecified abnormalities of gait and mobility: Secondary | ICD-10-CM | POA: Diagnosis not present

## 2016-07-30 DIAGNOSIS — I4891 Unspecified atrial fibrillation: Secondary | ICD-10-CM | POA: Diagnosis not present

## 2016-07-30 DIAGNOSIS — W19XXXD Unspecified fall, subsequent encounter: Secondary | ICD-10-CM | POA: Diagnosis not present

## 2016-07-30 DIAGNOSIS — R55 Syncope and collapse: Secondary | ICD-10-CM | POA: Diagnosis not present

## 2016-07-30 DIAGNOSIS — E1169 Type 2 diabetes mellitus with other specified complication: Secondary | ICD-10-CM | POA: Diagnosis not present

## 2016-07-30 DIAGNOSIS — Z95828 Presence of other vascular implants and grafts: Secondary | ICD-10-CM | POA: Diagnosis not present

## 2016-08-01 DIAGNOSIS — I10 Essential (primary) hypertension: Secondary | ICD-10-CM | POA: Diagnosis not present

## 2016-08-01 DIAGNOSIS — R55 Syncope and collapse: Secondary | ICD-10-CM | POA: Diagnosis not present

## 2016-08-01 DIAGNOSIS — I361 Nonrheumatic tricuspid (valve) insufficiency: Secondary | ICD-10-CM | POA: Diagnosis not present

## 2016-08-01 DIAGNOSIS — R269 Unspecified abnormalities of gait and mobility: Secondary | ICD-10-CM | POA: Diagnosis not present

## 2016-08-01 DIAGNOSIS — Z95828 Presence of other vascular implants and grafts: Secondary | ICD-10-CM | POA: Diagnosis not present

## 2016-08-01 DIAGNOSIS — E785 Hyperlipidemia, unspecified: Secondary | ICD-10-CM | POA: Diagnosis not present

## 2016-08-01 DIAGNOSIS — I482 Chronic atrial fibrillation: Secondary | ICD-10-CM | POA: Diagnosis not present

## 2016-08-01 DIAGNOSIS — W19XXXD Unspecified fall, subsequent encounter: Secondary | ICD-10-CM | POA: Diagnosis not present

## 2016-08-01 DIAGNOSIS — I2729 Other secondary pulmonary hypertension: Secondary | ICD-10-CM | POA: Diagnosis not present

## 2016-08-01 DIAGNOSIS — I340 Nonrheumatic mitral (valve) insufficiency: Secondary | ICD-10-CM | POA: Diagnosis not present

## 2016-08-01 DIAGNOSIS — E1169 Type 2 diabetes mellitus with other specified complication: Secondary | ICD-10-CM | POA: Diagnosis not present

## 2016-08-01 DIAGNOSIS — I4891 Unspecified atrial fibrillation: Secondary | ICD-10-CM | POA: Diagnosis not present

## 2016-08-02 DIAGNOSIS — Z95828 Presence of other vascular implants and grafts: Secondary | ICD-10-CM | POA: Diagnosis not present

## 2016-08-02 DIAGNOSIS — I4891 Unspecified atrial fibrillation: Secondary | ICD-10-CM | POA: Diagnosis not present

## 2016-08-02 DIAGNOSIS — E1169 Type 2 diabetes mellitus with other specified complication: Secondary | ICD-10-CM | POA: Diagnosis not present

## 2016-08-02 DIAGNOSIS — W19XXXD Unspecified fall, subsequent encounter: Secondary | ICD-10-CM | POA: Diagnosis not present

## 2016-08-02 DIAGNOSIS — R55 Syncope and collapse: Secondary | ICD-10-CM | POA: Diagnosis not present

## 2016-08-02 DIAGNOSIS — R269 Unspecified abnormalities of gait and mobility: Secondary | ICD-10-CM | POA: Diagnosis not present

## 2016-08-03 DIAGNOSIS — E039 Hypothyroidism, unspecified: Secondary | ICD-10-CM | POA: Diagnosis not present

## 2016-08-03 DIAGNOSIS — E785 Hyperlipidemia, unspecified: Secondary | ICD-10-CM | POA: Diagnosis not present

## 2016-08-03 DIAGNOSIS — E1142 Type 2 diabetes mellitus with diabetic polyneuropathy: Secondary | ICD-10-CM | POA: Diagnosis not present

## 2016-08-03 DIAGNOSIS — I10 Essential (primary) hypertension: Secondary | ICD-10-CM | POA: Diagnosis not present

## 2016-08-08 DIAGNOSIS — R55 Syncope and collapse: Secondary | ICD-10-CM | POA: Diagnosis not present

## 2016-08-08 DIAGNOSIS — R269 Unspecified abnormalities of gait and mobility: Secondary | ICD-10-CM | POA: Diagnosis not present

## 2016-08-08 DIAGNOSIS — E1169 Type 2 diabetes mellitus with other specified complication: Secondary | ICD-10-CM | POA: Diagnosis not present

## 2016-08-08 DIAGNOSIS — Z95828 Presence of other vascular implants and grafts: Secondary | ICD-10-CM | POA: Diagnosis not present

## 2016-08-08 DIAGNOSIS — I4891 Unspecified atrial fibrillation: Secondary | ICD-10-CM | POA: Diagnosis not present

## 2016-08-08 DIAGNOSIS — W19XXXD Unspecified fall, subsequent encounter: Secondary | ICD-10-CM | POA: Diagnosis not present

## 2016-08-09 DIAGNOSIS — I2729 Other secondary pulmonary hypertension: Secondary | ICD-10-CM | POA: Diagnosis not present

## 2016-08-09 DIAGNOSIS — I482 Chronic atrial fibrillation: Secondary | ICD-10-CM | POA: Diagnosis not present

## 2016-08-09 DIAGNOSIS — I340 Nonrheumatic mitral (valve) insufficiency: Secondary | ICD-10-CM | POA: Diagnosis not present

## 2016-08-09 DIAGNOSIS — E785 Hyperlipidemia, unspecified: Secondary | ICD-10-CM | POA: Diagnosis not present

## 2016-08-09 DIAGNOSIS — I10 Essential (primary) hypertension: Secondary | ICD-10-CM | POA: Diagnosis not present

## 2016-08-09 DIAGNOSIS — I361 Nonrheumatic tricuspid (valve) insufficiency: Secondary | ICD-10-CM | POA: Diagnosis not present

## 2016-08-10 DIAGNOSIS — I482 Chronic atrial fibrillation: Secondary | ICD-10-CM | POA: Diagnosis not present

## 2016-08-10 DIAGNOSIS — R55 Syncope and collapse: Secondary | ICD-10-CM | POA: Diagnosis not present

## 2016-08-10 DIAGNOSIS — E785 Hyperlipidemia, unspecified: Secondary | ICD-10-CM | POA: Diagnosis not present

## 2016-08-10 DIAGNOSIS — E039 Hypothyroidism, unspecified: Secondary | ICD-10-CM | POA: Diagnosis not present

## 2016-08-10 DIAGNOSIS — E1169 Type 2 diabetes mellitus with other specified complication: Secondary | ICD-10-CM | POA: Diagnosis not present

## 2016-08-10 DIAGNOSIS — W19XXXD Unspecified fall, subsequent encounter: Secondary | ICD-10-CM | POA: Diagnosis not present

## 2016-08-10 DIAGNOSIS — Z95828 Presence of other vascular implants and grafts: Secondary | ICD-10-CM | POA: Diagnosis not present

## 2016-08-10 DIAGNOSIS — Z950 Presence of cardiac pacemaker: Secondary | ICD-10-CM | POA: Diagnosis not present

## 2016-08-10 DIAGNOSIS — I10 Essential (primary) hypertension: Secondary | ICD-10-CM | POA: Diagnosis not present

## 2016-08-10 DIAGNOSIS — R269 Unspecified abnormalities of gait and mobility: Secondary | ICD-10-CM | POA: Diagnosis not present

## 2016-08-10 DIAGNOSIS — I4891 Unspecified atrial fibrillation: Secondary | ICD-10-CM | POA: Diagnosis not present

## 2016-08-10 DIAGNOSIS — R42 Dizziness and giddiness: Secondary | ICD-10-CM | POA: Diagnosis not present

## 2016-08-10 DIAGNOSIS — E1142 Type 2 diabetes mellitus with diabetic polyneuropathy: Secondary | ICD-10-CM | POA: Diagnosis not present

## 2016-08-21 ENCOUNTER — Encounter (HOSPITAL_COMMUNITY): Payer: Self-pay | Admitting: Emergency Medicine

## 2016-08-21 ENCOUNTER — Emergency Department (HOSPITAL_COMMUNITY)
Admission: EM | Admit: 2016-08-21 | Discharge: 2016-08-21 | Disposition: A | Discharge status: Home / Self Care | Payer: Medicare Other | Attending: Emergency Medicine | Admitting: Emergency Medicine

## 2016-08-21 DIAGNOSIS — E039 Hypothyroidism, unspecified: Secondary | ICD-10-CM | POA: Diagnosis not present

## 2016-08-21 DIAGNOSIS — Z7902 Long term (current) use of antithrombotics/antiplatelets: Secondary | ICD-10-CM | POA: Insufficient documentation

## 2016-08-21 DIAGNOSIS — I10 Essential (primary) hypertension: Secondary | ICD-10-CM | POA: Insufficient documentation

## 2016-08-21 DIAGNOSIS — R42 Dizziness and giddiness: Secondary | ICD-10-CM | POA: Diagnosis not present

## 2016-08-21 DIAGNOSIS — Z950 Presence of cardiac pacemaker: Secondary | ICD-10-CM | POA: Diagnosis not present

## 2016-08-21 DIAGNOSIS — Z7984 Long term (current) use of oral hypoglycemic drugs: Secondary | ICD-10-CM | POA: Diagnosis not present

## 2016-08-21 DIAGNOSIS — Z79899 Other long term (current) drug therapy: Secondary | ICD-10-CM | POA: Diagnosis not present

## 2016-08-21 DIAGNOSIS — E119 Type 2 diabetes mellitus without complications: Secondary | ICD-10-CM | POA: Diagnosis not present

## 2016-08-21 HISTORY — DX: Type 2 diabetes mellitus without complications: E11.9

## 2016-08-21 HISTORY — DX: Essential (primary) hypertension: I10

## 2016-08-21 HISTORY — DX: Hyperlipidemia, unspecified: E78.5

## 2016-08-21 HISTORY — DX: Disorder of thyroid, unspecified: E07.9

## 2016-08-21 LAB — BASIC METABOLIC PANEL
Anion gap: 8 (ref 5–15)
BUN: 13 mg/dL (ref 6–20)
CO2: 27 mmol/L (ref 22–32)
Calcium: 9.1 mg/dL (ref 8.9–10.3)
Chloride: 106 mmol/L (ref 101–111)
Creatinine, Ser: 0.86 mg/dL (ref 0.44–1.00)
GFR calc Af Amer: >60 mL/min (ref >60)
GFR calc non Af Amer: 59 mL/min — ABNORMAL LOW (ref >60)
Glucose, Bld: 111 mg/dL — ABNORMAL HIGH (ref 65–99)
POTASSIUM: 3.8 mmol/L (ref 3.5–5.1)
Sodium: 141 mmol/L (ref 135–145)

## 2016-08-21 LAB — URINALYSIS, ROUTINE W REFLEX MICROSCOPIC
Bilirubin Urine: NEGATIVE
GLUCOSE, UA: NEGATIVE mg/dL
Hgb urine dipstick: NEGATIVE
Ketones, ur: 5 mg/dL — AB
Leukocytes, UA: NEGATIVE
Nitrite: NEGATIVE
PROTEIN: NEGATIVE mg/dL
Specific Gravity, Urine: 1.017 (ref 1.005–1.030)
pH: 5 (ref 5.0–8.0)

## 2016-08-21 LAB — CBC
HCT: 33.6 % — ABNORMAL LOW (ref 36.0–46.0)
Hemoglobin: 11 g/dL — ABNORMAL LOW (ref 12.0–15.0)
MCH: 29.8 pg (ref 26.0–34.0)
MCHC: 32.7 g/dL (ref 30.0–36.0)
MCV: 91.1 fL (ref 78.0–100.0)
Platelets: 239 10*3/uL (ref 150–400)
RBC: 3.69 MIL/uL — AB (ref 3.87–5.11)
RDW: 15.1 % (ref 11.5–15.5)
WBC: 5.9 10*3/uL (ref 4.0–10.5)

## 2016-08-21 LAB — CBG MONITORING, ED: Glucose-Capillary: 111 mg/dL — ABNORMAL HIGH (ref 65–99)

## 2016-08-21 NOTE — ED Notes (Signed)
2 IV attempts unsuccessful. 

## 2016-08-21 NOTE — ED Notes (Addendum)
Family adds after patients fall in March she had a concussion and also had pacemaker placed. Pt states when she turns her head really quick she feels like she is going to faint. Adriana Simas, MD at bedside.

## 2016-08-21 NOTE — ED Notes (Signed)
Bed: WA05 Expected date:  Expected time:  Means of arrival:  Comments: 

## 2016-08-21 NOTE — Discharge Instructions (Signed)
Tests show no life-threatening condition. Continue to use a cane or walker. Follow-up your primary care doctor or return if worse. Continue the Antivert at home.

## 2016-08-21 NOTE — ED Triage Notes (Signed)
Pt and family reporting recent onset of dizziness, x 1 week. Hx of head trauma in March. Family stated that BP has increased steadily over last week. (family at bedside- caregiver is [employer] fund[employer]). Pt is alert, oriented and co-operative. Denies NV, denies burning on urination. Ambulatory with cane, but unsteady. Denies recent LOC

## 2016-08-22 NOTE — ED Provider Notes (Signed)
[facility]EM[facility]ENCY DEPT Provider Note   CSN: [id number] Arrival date & time: 08/21/16  1050     History   Chief Complaint Chief Complaint  Patient presents with  . Dizziness  . Hypertension    HPI Kerry Carlson is a 83 y.o. female.  Status post fall in March resulting in a concussion and pacemaker placement. Today she states lightheadedness and dizziness when turning her head quickly. There is a concern about her blood pressure management. No frank neurological deficits. No fever, sweats, chills, chest pain, dyspnea, dysuria, neurological deficits. Her blood pressure medicine was recently discontinued. Patient has a known history of atrial fibrillation.      Past Medical History:  Diagnosis Date  . Diabetes mellitus without complication (HCC)    Type 2  . Hyperlipidemia   . Hypertension   . Thyroid disease    Hypothyroid    There are no active problems to display for this patient.   Past Surgical History:  Procedure Laterality Date  . ABDOMINAL HYSTERECTOMY    . PACEMAKER INSERTION      OB History    No data available       Home Medications    Prior to Admission medications   Medication Sig Start Date End Date Taking? Authorizing Provider  acetaminophen (TYLENOL) 500 MG tablet Take 1,000 mg by mouth every 6 (six) hours as needed.   Yes Historical Provider, MD  atenolol (TENORMIN) 100 MG tablet Take 100 mg by mouth daily.   Yes Historical Provider, MD  docusate sodium (COLACE) 100 MG capsule Take 200 mg by mouth daily.   Yes Historical Provider, MD  levothyroxine (SYNTHROID, LEVOTHROID) 50 MCG tablet Take 50 mcg by mouth daily before breakfast.   Yes Historical Provider, MD  metFORMIN (GLUCOPHAGE) 500 MG tablet Take by mouth daily with breakfast.   Yes Historical Provider, MD  pravastatin (PRAVACHOL) 10 MG tablet Take 10 mg by mouth daily.   Yes Historical Provider, MD  Rivaroxaban (XARELTO) 15 MG TABS tablet Take 15 mg by mouth daily with supper.   Yes  Historical Provider, MD    Family History No family history on file.  Social History Social History  Substance Use Topics  . Smoking status: Never Smoker  . Smokeless tobacco: Never Used  . Alcohol use No     Allergies   Aleve [naproxen]; Aspirin; and Penicillins   Review of Systems Review of Systems  All other systems reviewed and are negative.    Physical Exam Updated Vital Signs BP (!) 156/93   Pulse 85   Temp 97.7 F (36.5 C) (Oral)   Resp 18   Wt 151 lb (68.5 kg)   SpO2 99%   Physical Exam  Constitutional: She is oriented to person, place, and time. She appears well-developed and well-nourished.  Pleasant, alert, no gross neurological deficits.  HENT:  Head: Normocephalic and atraumatic.  Eyes: Conjunctivae are normal.  Neck: Neck supple.  Cardiovascular: Normal rate and regular rhythm.   Pulmonary/Chest: Effort normal and breath sounds normal.  Abdominal: Soft. Bowel sounds are normal.  Musculoskeletal: Normal range of motion.  Neurological: She is alert and oriented to person, place, and time.  Skin: Skin is warm and dry.  Psychiatric: She has a normal mood and affect. Her behavior is normal.  Nursing note and vitals reviewed.    ED Treatments / Results  Labs (all labs ordered are listed, but only abnormal results are displayed) Labs Reviewed  BASIC METABOLIC PANEL - Abnormal; Notable for  the following:       Result Value   Glucose, Bld 111 (*)    GFR calc non Af Amer 59 (*)    All other components within normal limits  CBC - Abnormal; Notable for the following:    RBC 3.69 (*)    Hemoglobin 11.0 (*)    HCT 33.6 (*)    All other components within normal limits  URINALYSIS, ROUTINE W REFLEX MICROSCOPIC - Abnormal; Notable for the following:    Ketones, ur 5 (*)    All other components within normal limits  CBG MONITORING, ED - Abnormal; Notable for the following:    Glucose-Capillary 111 (*)    All other components within normal limits     EKG  EKG Interpretation  Date/Time:  Tuesday Aug 21 2016 11:28:34 EDT Ventricular Rate:  87 PR Interval:    QRS Duration: 85 QT Interval:  381 QTC Calculation: 434 R Axis:   22 Text Interpretation:  Atrial fibrillation Anteroseptal infarct, age indeterminate Lateral leads are also involved Baseline wander in lead(s) V1 Confirmed by Kenlie Seki  MD, Jacquese Cassarino ([id number]) on 08/21/2016 12:08:02 PM       Radiology No results found.  Procedures Procedures (including critical care time)  Medications Ordered in ED Medications - No data to display   Initial Impression / Assessment and Plan / ED Course  I have reviewed the triage vital signs and the nursing notes.  Pertinent labs & imaging results that were available during my care of the patient were reviewed by me and considered in my medical decision making (see chart for details).     Patient is alert and oriented without acute deficits. Blood pressure is acceptable. Her pacemaker was interrogated and appears to be working properly. Screening labs, EKG show no acute findings. These findings were discussed with the patient and her family.  Final Clinical Impressions(s) / ED Diagnoses   Final diagnoses:  Vertigo    New Prescriptions Discharge Medication List as of 08/21/2016  2:56 PM       Donnetta Hutching, MD 08/22/16 [phone]

## 2016-08-23 DIAGNOSIS — E038 Other specified hypothyroidism: Secondary | ICD-10-CM | POA: Diagnosis not present

## 2016-08-23 DIAGNOSIS — Z950 Presence of cardiac pacemaker: Secondary | ICD-10-CM | POA: Diagnosis not present

## 2016-08-23 DIAGNOSIS — I129 Hypertensive chronic kidney disease with stage 1 through stage 4 chronic kidney disease, or unspecified chronic kidney disease: Secondary | ICD-10-CM | POA: Diagnosis not present

## 2016-08-23 DIAGNOSIS — N183 Chronic kidney disease, stage 3 (moderate): Secondary | ICD-10-CM | POA: Diagnosis not present

## 2016-08-23 DIAGNOSIS — I6789 Other cerebrovascular disease: Secondary | ICD-10-CM | POA: Diagnosis not present

## 2016-08-23 DIAGNOSIS — E1122 Type 2 diabetes mellitus with diabetic chronic kidney disease: Secondary | ICD-10-CM | POA: Diagnosis not present

## 2016-08-23 DIAGNOSIS — E784 Other hyperlipidemia: Secondary | ICD-10-CM | POA: Diagnosis not present

## 2016-08-23 DIAGNOSIS — M1990 Unspecified osteoarthritis, unspecified site: Secondary | ICD-10-CM | POA: Diagnosis not present

## 2016-08-28 ENCOUNTER — Encounter: Payer: Self-pay | Admitting: Physical Therapy

## 2016-08-28 ENCOUNTER — Ambulatory Visit: Payer: Medicare Other | Attending: Internal Medicine | Admitting: Physical Therapy

## 2016-08-28 DIAGNOSIS — R42 Dizziness and giddiness: Secondary | ICD-10-CM | POA: Insufficient documentation

## 2016-08-28 DIAGNOSIS — Z9181 History of falling: Secondary | ICD-10-CM | POA: Diagnosis not present

## 2016-08-28 DIAGNOSIS — M6281 Muscle weakness (generalized): Secondary | ICD-10-CM | POA: Diagnosis not present

## 2016-08-28 DIAGNOSIS — R2681 Unsteadiness on feet: Secondary | ICD-10-CM | POA: Insufficient documentation

## 2016-08-28 DIAGNOSIS — H8111 Benign paroxysmal vertigo, right ear: Secondary | ICD-10-CM | POA: Diagnosis not present

## 2016-08-28 DIAGNOSIS — R2689 Other abnormalities of gait and mobility: Secondary | ICD-10-CM

## 2016-08-29 DIAGNOSIS — R0609 Other forms of dyspnea: Secondary | ICD-10-CM | POA: Diagnosis not present

## 2016-08-29 DIAGNOSIS — Z950 Presence of cardiac pacemaker: Secondary | ICD-10-CM | POA: Diagnosis not present

## 2016-08-29 DIAGNOSIS — I495 Sick sinus syndrome: Secondary | ICD-10-CM | POA: Diagnosis not present

## 2016-08-29 DIAGNOSIS — I482 Chronic atrial fibrillation: Secondary | ICD-10-CM | POA: Diagnosis not present

## 2016-08-29 NOTE — Therapy (Signed)
[facility] [address] [place], [place], [address] Phone: [phone]   Fax:  [phone]  Physical Therapy Evaluation  Patient Details  Name: Kerry Carlson MRN: [id number] Date of Birth: [birth year] Referring Provider: Guerry Bruin, MD  Encounter Date: 08/28/2016      PT End of Session - 08/28/16 1659    Visit Number 1   Number of Visits 18   Date for PT Re-Evaluation 10/26/16   Authorization Type Medicare G-Code every 10th visit   PT Start Time 1445   PT Stop Time 1533   PT Time Calculation (min) 48 min   Equipment Utilized During Treatment Gait belt   Activity Tolerance Patient tolerated treatment well   Behavior During Therapy [facility] for tasks assessed/performed      Past Medical History:  Diagnosis Date  . Diabetes mellitus without complication (HCC)    Type 2  . Hyperlipidemia   . Hypertension   . Thyroid disease    Hypothyroid    Past Surgical History:  Procedure Laterality Date  . ABDOMINAL HYSTERECTOMY    . PACEMAKER INSERTION      There were no vitals filed for this visit.       Subjective Assessment - 08/28/16 1455    Subjective This 83yo female was living independently in [place], [place] including using stairs to access her 3rd floor appointment. Per patient report, on 07/12/2016 her heart stopped and she fell backwards sustaining a concussion with vertigo. She was hospitalized 6 days with placement of pacemaker and concussion care. She returned to [place] to reside with son & dtr-law, which is temporary per patient. On 08/21/2016, she was seen in [facility] ED for vertigo & cardiac issues. Patient presents for PT evaluation today accompanied by her dtr-law.    Patient is accompained by: Family member   Limitations Lifting;Walking;Standing;House hold activities   Patient Stated Goals to get rid of headache & dizziness and be back to normal.    Currently in Pain? No/denies            [facility] PT  Assessment - 08/28/16 1445      Assessment   Medical Diagnosis Falls, concussion,    Referring Provider Guerry Bruin, MD   Onset Date/Surgical Date 07/12/16   Hand Dominance Right     Precautions   Precautions Fall;ICD/Pacemaker     Balance Screen   Has the patient fallen in the past 6 months Yes   How many times? 1   Has the patient had a decrease in activity level because of a fear of falling?  Yes   Is the patient reluctant to leave their home because of a fear of falling?  No     Home Tourist information centre manager residence   Living Arrangements Alone;Children  in [place] lives alone, [place] son & dtr-law   Type of Home House  condo in [place], house in [place]   Home Access Stairs to enter  condo is 3rd floor but has elevator, 3 steps   Entrance Stairs-Number of Steps 3   Entrance Stairs-Rails None   Home Layout Able to live on main level with bedroom/bathroom;Two level   Home Equipment Cane - single point;Walker - standard;Shower seat;Grab bars - tub/shower     Prior Function   Level of Independence Independent;Independent with household mobility without device;Independent with community mobility without device;Independent with gait   Vocation Retired   Electronics engineer, crafts, pool     Metallurgist  Posture/Postural Control No significant limitations     Transfers   Transfers Sit to Stand;Stand to Sit   Sit to Stand 6: Modified independent (Device/Increase time);Without upper extremity assist;From chair/3-in-1   Stand to Sit 6: Modified independent (Device/Increase time);Without upper extremity assist;To chair/3-in-1     Ambulation/Gait   Ambulation/Gait Yes   Ambulation/Gait Assistance 4: Min assist   Ambulation Distance (Feet) 300 Feet   Assistive device Straight cane   Gait Pattern Step-through pattern;Decreased arm swing - right;Decreased stride length   Ambulation Surface Indoor;Level   Gait velocity 3.09 ft/sec   Stairs --      Standardized Balance Assessment   Standardized Balance Assessment Berg Balance Test;Timed Up and Go Test     Berg Balance Test   Sit to Stand Able to stand without using hands and stabilize independently   Standing Unsupported Able to stand safely 2 minutes   Sitting with Back Unsupported but Feet Supported on Floor or Stool Able to sit safely and securely 2 minutes   Stand to Sit Sits safely with minimal use of hands   Transfers Able to transfer safely, minor use of hands   Standing Unsupported with Eyes Closed Able to stand 10 seconds with supervision   Standing Ubsupported with Feet Together Able to place feet together independently but unable to hold for 30 seconds   From Standing, Reach Forward with Outstretched Arm Can reach forward >5 cm safely (2")   From Standing Position, Pick up Object from Floor Able to pick up shoe, needs supervision   From Standing Position, Turn to Look Behind Over each Shoulder Looks behind from both sides and weight shifts well   Turn 360 Degrees Able to turn 360 degrees safely but slowly   Standing Unsupported, Alternately Place Feet on Step/Stool Able to complete >2 steps/needs minimal assist   Standing Unsupported, One Foot in Front Able to take small step independently and hold 30 seconds   Standing on One Leg Tries to lift leg/unable to hold 3 seconds but remains standing independently   Total Score 40     Timed Up and Go Test   Normal TUG (seconds) 14.29  14.29 sec w/ cane & 11.69 sec without device   Cognitive TUG (seconds) 29  29.00 sec with cane, stopped talking; 103% increase from std            Vestibular Assessment - 08/28/16 1445      Symptom Behavior   Type of Dizziness Unsteady with head/body turns   Duration of Dizziness < 1 minute    Aggravating Factors Sitting with head tilted back;Turning body quickly;Turning head quickly;Supine to sit;Rolling to right   Relieving Factors Head stationary;Slow movements     Occulomotor Exam    Occulomotor Alignment Normal   Spontaneous Absent   Smooth Pursuits Saccades  noted small catch up saccades      Positional Testing   Dix-Hallpike Dix-Hallpike Right;Dix-Hallpike Left   Horizontal Canal Testing Horizontal Canal Right;Horizontal Canal Left     Dix-Hallpike Right   Dix-Hallpike Right Duration approximately 15 seconds    Dix-Hallpike Right Symptoms Upbeat, right rotatory nystagmus     Dix-Hallpike Left   Dix-Hallpike Left Duration --   Dix-Hallpike Left Symptoms No nystagmus     Horizontal Canal Right   Horizontal Canal Right Symptoms Normal     Horizontal Canal Left   Horizontal Canal Left Symptoms Normal  PT Short Term Goals - 08/29/16 1517      PT SHORT TERM GOAL #1   Title Patient demonstrates initial HEP.  (Target Date: 09/28/2016)   Time 1   Period Months   Status New     PT SHORT TERM GOAL #2   Title Berg Balance Test >/= 45/56 to indicate lower fall risk.  (Target Date: 09/28/2016)   Time 1   Period Months   Status New     PT SHORT TERM GOAL #3   Title Cognitive Timed Up-Go with cane <24sec.    Time 1   Period Months   Status New     PT SHORT TERM GOAL #4   Title complete vestibular testing & set STG   Time 1   Period Months   Status New     PT SHORT TERM GOAL #5   Title Patient ambulates with cane scanning environment without balance loss.   (Target Date: 09/28/2016)   Time 1   Period Months   Status New           PT Long Term Goals - 08/28/16 1812      PT LONG TERM GOAL #1   Title Patient demonstrates understanding of ongoing fitness plan / HEP.  (Target Date: 10/26/2016)   Time 2   Period Months   Status New     PT LONG TERM GOAL #2   Title Patient has negative testing for positional vertigo / dizziness.   (Target Date: 10/26/2016)   Time 2   Period Months   Status New     PT LONG TERM GOAL #3   Title Berg Balance >48/56 to indicate lower fall risk.  (Target Date: 10/26/2016)   Time 2    Period Months   Status New     PT LONG TERM GOAL #4   Title Timed Up-Go <13 sec and Cognitive TUG <20 sec to indicate lower fall risk.   (Target Date: 10/26/2016)   Time 2   Period Months   Status New     PT LONG TERM GOAL #5   Title Patient ambulates 1000' with cane or less modified independent.  (Target Date: 10/26/2016)   Time 2   Period Months   Status New     Additional Long Term Goals   Additional Long Term Goals Yes     PT LONG TERM GOAL #6   Title Patient negotiates ramps, curbs & stairs (1 rail) with cane or less modified independent for community access.   (Target Date: 10/26/2016)   Time 2   Period Months   Status New     PT LONG TERM GOAL #7   Title Complete further vestibular testing & set LTG.  (Target Date: 10/26/2016)   Time 2   Period Months   Status New               Plan - 08/29/16 1500    Clinical Impression Statement This 83yo female was active & living indepedently functioning at community level without device prior to cardiac event & fall with concussion. She has dizziness with nystagmus with head movements & position changes. Patient's Berg Balance Test of 40/56 indicates fall risk. Timed Up-Go with cane 14.29sec and cognitive TUG of 29.00sec (103% increase) indicate fall risk. Patient's gait is dependent & unsafe. Patient's condition is evolving & plan of care is moderate complex.  Rehab Potential Good   PT Frequency 2x / week   PT Duration Other (comment)  9 weeks (60 days)   PT Treatment/Interventions ADLs/Self Care Home Management;DME Instruction;Gait training;Stair training;Functional mobility training;Therapeutic activities;Therapeutic exercise;Balance training;Neuromuscular re-education;Patient/family education;Vestibular;Canalith Repostioning   PT Next Visit Plan complete full occulomotor and VOR examination, R Epley's maneuver; initiate HEP   Consulted and Agree with Plan of Care Patient;Family member/caregiver    Family Member Consulted dtr-law      Patient will benefit from skilled therapeutic intervention in order to improve the following deficits and impairments:  Abnormal gait, Decreased activity tolerance, Decreased balance, Cardiopulmonary status limiting activity, Decreased endurance, Decreased mobility, Decreased strength, Dizziness  Visit Diagnosis: Dizziness and giddiness  BPPV (benign paroxysmal positional vertigo), right  Unsteadiness on feet  Other abnormalities of gait and mobility  History of falling  Muscle weakness (generalized)      G-Codes - 06-Sep-2016 1820    Functional Assessment Tool Used (Outpatient Only) Timed Up-Go with cane 14.29sec and cognitive TUG 29.00sec.  Berg Balance 40/56   Functional Limitation Mobility: Walking and moving around   Mobility: Walking and Moving Around Current Status [phone]) At least 40 percent but less than 60 percent impaired, limited or restricted   Mobility: Walking and Moving Around Goal Status [phone]) At least 1 percent but less than 20 percent impaired, limited or restricted       Problem List There are no active problems to display for this patient.   Vladimir Faster PT, [name] 08/29/2016, 3:22 PM  [facility] [address] [place], [place], [address] Phone: [phone]   Fax:  [phone]  Name: ZILPHA MCANDREW MRN: [id number] Date of Birth: [birth year]

## 2016-08-31 ENCOUNTER — Encounter: Payer: Self-pay | Admitting: Physical Therapy

## 2016-08-31 ENCOUNTER — Ambulatory Visit: Payer: Medicare Other | Admitting: Physical Therapy

## 2016-08-31 DIAGNOSIS — R2689 Other abnormalities of gait and mobility: Secondary | ICD-10-CM | POA: Diagnosis not present

## 2016-08-31 DIAGNOSIS — H8111 Benign paroxysmal vertigo, right ear: Secondary | ICD-10-CM

## 2016-08-31 DIAGNOSIS — R2681 Unsteadiness on feet: Secondary | ICD-10-CM

## 2016-08-31 DIAGNOSIS — Z9181 History of falling: Secondary | ICD-10-CM | POA: Diagnosis not present

## 2016-08-31 DIAGNOSIS — M6281 Muscle weakness (generalized): Secondary | ICD-10-CM | POA: Diagnosis not present

## 2016-08-31 DIAGNOSIS — R42 Dizziness and giddiness: Secondary | ICD-10-CM | POA: Diagnosis not present

## 2016-08-31 NOTE — Therapy (Signed)
[facility] [address] [place], [place], [address] Phone: [phone]   Fax:  [phone]  Physical Therapy Treatment  Patient Details  Name: Kerry Carlson MRN: [id number] Date of Birth: 20-Feb-1926 Referring Provider: Guerry Bruin, MD  Encounter Date: 08/31/2016      PT End of Session - 08/31/16 1600    Visit Number 2   Number of Visits 18   Date for PT Re-Evaluation 10/26/16   Authorization Type Medicare G-Code every 10th visit   PT Start Time 1453   PT Stop Time 1538   PT Time Calculation (min) 45 min   Activity Tolerance Patient tolerated treatment well   Behavior During Therapy [facility] for tasks assessed/performed      Past Medical History:  Diagnosis Date  . Diabetes mellitus without complication (HCC)    Type 2  . Hyperlipidemia   . Hypertension   . Thyroid disease    Hypothyroid    Past Surgical History:  Procedure Laterality Date  . ABDOMINAL HYSTERECTOMY    . PACEMAKER INSERTION      There were no vitals filed for this visit.      Subjective Assessment - 08/31/16 1501    Subjective Pt returns for further vestibular testing due to continued reports of dizziness.  No issues since evaluation.  No pain or headaches.   Patient is accompained by: Family member   Limitations Lifting;Walking;Standing;House hold activities   Patient Stated Goals to get rid of headache & dizziness and be back to normal.    Currently in Pain? No/denies                Vestibular Assessment - 08/31/16 1503      Vestibular Assessment   General Observation needs son's assistance to ambulate      Symptom Behavior   Type of Dizziness Lightheadedness  at times she does report spinning   Frequency of Dizziness daily   Duration of Dizziness < 1 minute    Aggravating Factors Lying supine;Supine to sit;Sit to stand;Rolling to right;Rolling to left;Forward bending   Relieving Factors Head stationary;Slow  movements     Positional Testing   Dix-Hallpike Dix-Hallpike Right;Dix-Hallpike Left   Horizontal Canal Testing --     Dix-Hallpike Right   Dix-Hallpike Right Duration 10 seconds   Dix-Hallpike Right Symptoms Upbeat, right rotatory nystagmus     Dix-Hallpike Left   Dix-Hallpike Left Duration 0   Dix-Hallpike Left Symptoms No nystagmus     Orthostatics   BP supine (x 5 minutes) 150/90   HR supine (x 5 minutes) 83   BP sitting 150/95   HR sitting 87   BP standing (after 1 minute) 160/90   HR standing (after 1 minute) 76   BP standing (after 3 minutes) 150/90   HR standing (after 3 minutes) 90                  Vestibular Treatment/Exercise - 08/31/16 1528      Vestibular Treatment/Exercise   Vestibular Treatment Provided Canalith Repositioning   Canalith Repositioning Epley Manuever Right      EPLEY MANUEVER RIGHT   Number of Reps  2   Overall Response Improved Symptoms               PT Education - 08/31/16 1559    Education provided Yes   Education Details BPPV, sleeping positions that may contribute to re-occurence of BPPV; recommendations to elevate HOB secondary to pt sleeps without pillow  and on R side   Person(s) Educated Patient;Child(ren)   Methods Explanation   Comprehension Verbalized understanding          PT Short Term Goals - 08/31/16 1605      PT SHORT TERM GOAL #1   Title Patient demonstrates initial HEP.  (Target Date: 09/28/2016)   Time 1   Period Months   Status New     PT SHORT TERM GOAL #2   Title Berg Balance Test >/= 45/56 to indicate lower fall risk.  (Target Date: 09/28/2016)   Time 1   Period Months   Status New     PT SHORT TERM GOAL #3   Title Cognitive Timed Up-Go with cane <24sec.    Time 1   Period Months   Status New     PT SHORT TERM GOAL #4   Title Pt will report 1/10 dizziness with rolling and supine<>sit    Time 1   Period Months   Status Revised     PT SHORT TERM GOAL #5   Title Patient ambulates  with cane scanning environment without balance loss.   (Target Date: 09/28/2016)   Time 1   Period Months   Status New           PT Long Term Goals - 08/31/16 1606      PT LONG TERM GOAL #1   Title Patient demonstrates understanding of ongoing fitness plan / HEP.  (Target Date: 10/26/2016)   Time 2   Period Months   Status New     PT LONG TERM GOAL #2   Title Patient has negative testing (0/10) for positional vertigo / dizziness.   (Target Date: 10/26/2016)   Time 2   Period Months   Status New     PT LONG TERM GOAL #3   Title Berg Balance >48/56 to indicate lower fall risk.  (Target Date: 10/26/2016)   Time 2   Period Months   Status New     PT LONG TERM GOAL #4   Title Timed Up-Go <13 sec and Cognitive TUG <20 sec to indicate lower fall risk.   (Target Date: 10/26/2016)   Time 2   Period Months   Status New     PT LONG TERM GOAL #5   Title Patient ambulates 1000' with cane or less modified independent.  (Target Date: 10/26/2016)   Time 2   Period Months   Status New     PT LONG TERM GOAL #6   Title Patient negotiates ramps, curbs & stairs (1 rail) with cane or less modified independent for community access.   (Target Date: 10/26/2016)   Time 2   Period Months   Status New     PT LONG TERM GOAL #7   Title Complete further vestibular testing & set LTG.  (Target Date: 10/26/2016)   Time 2   Period Months   Status New               Plan - 08/31/16 1601    Clinical Impression Statement Pt returns with continued c/o dizziness during transitions in bed and rising from bed.  Due to significant cardiac history, initiated assessment with orthostatics-no significant change in BP with supine > sit > stand.  Continued vestibular assessment with positional testing and pt symptoms consistent with R posterior canal canalithiasis, treated x 2 with CRM.  Pt reports improvement in symptoms.  Will continue to monitor and treat as indicated.   Rehab Potential Good  PT  Treatment/Interventions ADLs/Self Care Home Management;DME Instruction;Gait training;Stair training;Functional mobility training;Therapeutic activities;Therapeutic exercise;Balance training;Neuromuscular re-education;Patient/family education;Vestibular;Canalith Repostioning   PT Next Visit Plan discuss patient's dizziness symptoms, initiate HEP   Consulted and Agree with Plan of Care Patient;Family member/caregiver   Family Member Consulted son      Patient will benefit from skilled therapeutic intervention in order to improve the following deficits and impairments:  Abnormal gait, Decreased activity tolerance, Decreased balance, Cardiopulmonary status limiting activity, Decreased endurance, Decreased mobility, Decreased strength, Dizziness  Visit Diagnosis: Dizziness and giddiness  BPPV (benign paroxysmal positional vertigo), right  Unsteadiness on feet  Other abnormalities of gait and mobility  History of falling  Muscle weakness (generalized)     Problem List There are no active problems to display for this patient.   Edman CircleAudra Hall, PT, [name] 08/31/16    4:07 PM    [facility] [address] [place], [place], [address] Phone: [phone]   Fax:  [phone]  Name: Kerry Carlson MRN: [id number] Date of Birth: 11/06/1925

## 2016-09-05 ENCOUNTER — Ambulatory Visit: Payer: Medicare Other | Admitting: Physical Therapy

## 2016-09-05 ENCOUNTER — Encounter: Payer: Self-pay | Admitting: Physical Therapy

## 2016-09-05 DIAGNOSIS — H8111 Benign paroxysmal vertigo, right ear: Secondary | ICD-10-CM

## 2016-09-05 DIAGNOSIS — R2681 Unsteadiness on feet: Secondary | ICD-10-CM

## 2016-09-05 DIAGNOSIS — R42 Dizziness and giddiness: Secondary | ICD-10-CM

## 2016-09-05 DIAGNOSIS — R2689 Other abnormalities of gait and mobility: Secondary | ICD-10-CM | POA: Diagnosis not present

## 2016-09-05 DIAGNOSIS — Z9181 History of falling: Secondary | ICD-10-CM

## 2016-09-05 DIAGNOSIS — M6281 Muscle weakness (generalized): Secondary | ICD-10-CM | POA: Diagnosis not present

## 2016-09-05 NOTE — Patient Instructions (Addendum)
Sit to Side-Lying   Tip Card  1.The goal of habituation training is to assist in decreasing symptoms of vertigo, dizziness, or nausea provoked by specific head and body motions. 2.These exercises may initially increase symptoms; however, be persistent and work through symptoms. With repetition and time, the exercises will assist in reducing or eliminating symptoms. 3.Exercises should be stopped and discussed with the therapist if you experience any of the following: - Sudden change or fluctuation in hearing - New onset of ringing in the ears, or increase in current intensity - Any fluid discharge from the ear - Severe pain in neck or back - Extreme nausea  Copyright  VHI. All rights reserved.  Sit to Side-Lying    Sit on edge of bed and turn head 45 toward right.  2. Maintain head position and lie down slowly onto the left side. Hold until symptoms subside.  3. Sit up slowly. Hold until symptoms subside.  4. Turn head 45 toward the left. 5. Maintain head position and lie down slowly on right side. Hold until symptoms subside.  6. Sit up slowly. Repeat sequence __3-4__ times each way per session. Do _1-2___ sessions per day.  Copyright  VHI. All rights reserved.    Gaze Stabilization: Tip Card  1.Target must remain in focus, not blurry, and appear stationary while head is in motion. 2.Perform exercises with small head movements (45 to either side of midline). 3.Increase speed of head motion so long as target is in focus. 4.If you wear eyeglasses, be sure you can see target through lens (therapist will give specific instructions for bifocal / progressive lenses). 5.These exercises may provoke dizziness or nausea. Work through these symptoms. If too dizzy, slow head movement slightly. Rest between each exercise. 6.Exercises demand concentration; avoid distractions. 7.For safety, perform standing exercises close to a counter, wall, corner, or next to someone.  Copyright  VHI.  All rights reserved.    Gaze Stabilization: Sitting    Keeping eyes on target on wall ~10 feet away, tilt head down 15-30: 1. Move head side to side for _20_ seconds.  2. Repeat while moving head up and down for _20_ seconds. Repeat the sequence with performing each head motion 3 times Do __1-2__ sessions per day.  Copyright  VHI. All rights reserved.

## 2016-09-07 ENCOUNTER — Encounter: Payer: Self-pay | Admitting: Physical Therapy

## 2016-09-07 ENCOUNTER — Ambulatory Visit: Payer: Medicare Other | Admitting: Physical Therapy

## 2016-09-07 DIAGNOSIS — M6281 Muscle weakness (generalized): Secondary | ICD-10-CM | POA: Diagnosis not present

## 2016-09-07 DIAGNOSIS — H8111 Benign paroxysmal vertigo, right ear: Secondary | ICD-10-CM

## 2016-09-07 DIAGNOSIS — R2689 Other abnormalities of gait and mobility: Secondary | ICD-10-CM

## 2016-09-07 DIAGNOSIS — Z9181 History of falling: Secondary | ICD-10-CM | POA: Diagnosis not present

## 2016-09-07 DIAGNOSIS — R42 Dizziness and giddiness: Secondary | ICD-10-CM

## 2016-09-07 DIAGNOSIS — R2681 Unsteadiness on feet: Secondary | ICD-10-CM | POA: Diagnosis not present

## 2016-09-07 NOTE — Therapy (Signed)
[facility] [address] [place], [place], [address] Phone: [phone]   Fax:  [phone]  Physical Therapy Treatment  Patient Details  Name: Kerry Carlson MRN: [id number] Date of Birth: [birth year] Referring Provider: Guerry Bruin, MD  Encounter Date: 09/05/2016   09/05/16 1455  PT Visits / Re-Eval  Visit Number 3  Number of Visits 18  Date for PT Re-Evaluation 10/26/16  Authorization  Authorization Type Medicare G-Code every 10th visit  PT Time Calculation  PT Start Time 1448  PT Stop Time 1530  PT Time Calculation (min) 42 min  PT - End of Session  Activity Tolerance Patient tolerated treatment well  Behavior During Therapy [facility] for tasks assessed/performed      Past Medical History:  Diagnosis Date  . Diabetes mellitus without complication (HCC)    Type 2  . Hyperlipidemia   . Hypertension   . Thyroid disease    Hypothyroid    Past Surgical History:  Procedure Laterality Date  . ABDOMINAL HYSTERECTOMY    . PACEMAKER INSERTION      There were no vitals filed for this visit.     09/05/16 1453  Symptoms/Limitations  Subjective No new complaints. No falls or pain to report. Still getting dizzy when she turns in bed, sits up sometimes and once when she bent over to pick up her cane.   Patient is accompained by: Family member  Limitations Lifting;Walking;Standing;House hold activities  Patient Stated Goals to get rid of headache & dizziness and be back to normal.   Pain Assessment  Currently in Pain? No/denies    Treatment:   Issued the following to pt's HEP today: Sit to Side-Lying   Tip Card  1.The goal of habituation training is to assist in decreasing symptoms of vertigo, dizziness, or nausea provoked by specific head and body motions. 2.These exercises may initially increase symptoms; however, be persistent and work through symptoms. With repetition and time, the exercises will  assist in reducing or eliminating symptoms. 3.Exercises should be stopped and discussed with the therapist if you experience any of the following: - Sudden change or fluctuation in hearing - New onset of ringing in the ears, or increase in current intensity - Any fluid discharge from the ear - Severe pain in neck or back - Extreme nausea  Copyright  VHI. All rights reserved.  Sit to Side-Lying    Sit on edge of bed and turn head 45 toward right.  2. Maintain head position and lie down slowly onto the left side. Hold until symptoms subside.  3. Sit up slowly. Hold until symptoms subside.  4. Turn head 45 toward the left. 5. Maintain head position and lie down slowly on right side. Hold until symptoms subside.  6. Sit up slowly. Repeat sequence __3-4__ times each way per session. Do _1-2___ sessions per day.  Copyright  VHI. All rights reserved.    Gaze Stabilization: Tip Card  1.Target must remain in focus, not blurry, and appear stationary while head is in motion. 2.Perform exercises with small head movements (45 to either side of midline). 3.Increase speed of head motion so long as target is in focus. 4.If you wear eyeglasses, be sure you can see target through lens (therapist will give specific instructions for bifocal / progressive lenses). 5.These exercises may provoke dizziness or nausea. Work through these symptoms. If too dizzy, slow head movement slightly. Rest between each exercise. 6.Exercises demand concentration; avoid distractions. 7.For safety, perform standing exercises close  to a counter, wall, corner, or next to someone.  Copyright  VHI. All rights reserved.    Gaze Stabilization: Sitting    Keeping eyes on target on wall ~10 feet away, tilt head down 15-30: 1. Move head side to side for _20_ seconds.  2. Repeat while moving head up and down for _20_ seconds. Repeat the sequence with performing each head motion 3 times Do __1-2__ sessions per  day.  Copyright  VHI. All rights reserved.      09/05/16 1700  PT Education  Education provided Yes  Education Details HEP for habituation and x1 exercises  Person(s) Educated Patient;Child(ren)  Methods Explanation;Demonstration;Verbal cues;Handout  Comprehension Verbalized understanding;Returned demonstration;Verbal cues required;Need further instruction          PT Short Term Goals - 08/31/16 1605      PT SHORT TERM GOAL #1   Title Patient demonstrates initial HEP.  (Target Date: 09/28/2016)   Time 1   Period Months   Status New     PT SHORT TERM GOAL #2   Title Berg Balance Test >/= 45/56 to indicate lower fall risk.  (Target Date: 09/28/2016)   Time 1   Period Months   Status New     PT SHORT TERM GOAL #3   Title Cognitive Timed Up-Go with cane <24sec.    Time 1   Period Months   Status New     PT SHORT TERM GOAL #4   Title Pt will report 1/10 dizziness with rolling and supine<>sit    Time 1   Period Months   Status Revised     PT SHORT TERM GOAL #5   Title Patient ambulates with cane scanning environment without balance loss.   (Target Date: 09/28/2016)   Time 1   Period Months   Status New           PT Long Term Goals - 08/31/16 1606      PT LONG TERM GOAL #1   Title Patient demonstrates understanding of ongoing fitness plan / HEP.  (Target Date: 10/26/2016)   Time 2   Period Months   Status New     PT LONG TERM GOAL #2   Title Patient has negative testing (0/10) for positional vertigo / dizziness.   (Target Date: 10/26/2016)   Time 2   Period Months   Status New     PT LONG TERM GOAL #3   Title Berg Balance >48/56 to indicate lower fall risk.  (Target Date: 10/26/2016)   Time 2   Period Months   Status New     PT LONG TERM GOAL #4   Title Timed Up-Go <13 sec and Cognitive TUG <20 sec to indicate lower fall risk.   (Target Date: 10/26/2016)   Time 2   Period Months   Status New     PT LONG TERM GOAL #5   Title Patient ambulates 1000' with  cane or less modified independent.  (Target Date: 10/26/2016)   Time 2   Period Months   Status New     PT LONG TERM GOAL #6   Title Patient negotiates ramps, curbs & stairs (1 rail) with cane or less modified independent for community access.   (Target Date: 10/26/2016)   Time 2   Period Months   Status New     PT LONG TERM GOAL #7   Title Complete further vestibular testing & set LTG.  (Target Date: 10/26/2016)   Time 2   Period Months  Status New        09/05/16 1455  Plan  Clinical Impression Statement Today's session focused on issuing Austin MilesBrandt Daroff and x1 exercises for HEP to further address pt's reports of dizziness that continued to occur at times. No nystagmus noted with any activity during session, however pt did report mild symptoms that occured and resolved quickly. Increased occurance with lying toward right side with Austin MilesBrandt Daroff and with lateral head movements with x1 viewing execises. Pt is progressing towards goals and should benefit from continued PT to progress toward unmet goals.                             Pt will benefit from skilled therapeutic intervention in order to improve on the following deficits Abnormal gait;Decreased activity tolerance;Decreased balance;Cardiopulmonary status limiting activity;Decreased endurance;Decreased mobility;Decreased strength;Dizziness  Rehab Potential Good  PT Treatment/Interventions ADLs/Self Care Home Management;DME Instruction;Gait training;Stair training;Functional mobility training;Therapeutic activities;Therapeutic exercise;Balance training;Neuromuscular re-education;Patient/family education;Vestibular;Canalith Repostioning  PT Next Visit Plan add to HEP: dynamic gait, corner balance; continued to address dizziness as needed.   Consulted and Agree with Plan of Care Patient;Family member/caregiver  Family Member Consulted son     Patient will benefit from skilled therapeutic intervention in order to improve the following  deficits and impairments:  Abnormal gait, Decreased activity tolerance, Decreased balance, Cardiopulmonary status limiting activity, Decreased endurance, Decreased mobility, Decreased strength, Dizziness  Visit Diagnosis: Dizziness and giddiness  Unsteadiness on feet  Other abnormalities of gait and mobility  History of falling  BPPV (benign paroxysmal positional vertigo), right     Problem List There are no active problems to display for this patient.  Sallyanne KusterKathy Favian Kittleson, PTA, [facility] [address], [address] [place], [place] [address] [phone] 09/07/16, 10:05 AM   Name: Kerry Carlson MRN: [id number] Date of Birth: 06/11/1925

## 2016-09-08 NOTE — Therapy (Signed)
[facility] [address] [place], [place], [address] Phone: [phone]   Fax:  [phone]  Physical Therapy Treatment  Patient Details  Name: Kerry Carlson MRN: [id number] Date of Birth: July 05, 1925 Referring Provider: Guerry Bruin, MD  Encounter Date: 09/07/2016      PT End of Session - 09/07/16 1610    Visit Number 4   Number of Visits 18   Date for PT Re-Evaluation 10/26/16   Authorization Type Medicare G-Code every 10th visit   PT Start Time 1415   PT Stop Time 1530  CW treated with student 1450-1530   PT Time Calculation (min) 75 min   Activity Tolerance Patient tolerated treatment well   Behavior During Therapy [facility] for tasks assessed/performed      Past Medical History:  Diagnosis Date  . Diabetes mellitus without complication (HCC)    Type 2  . Hyperlipidemia   . Hypertension   . Thyroid disease    Hypothyroid    Past Surgical History:  Procedure Laterality Date  . ABDOMINAL HYSTERECTOMY    . PACEMAKER INSERTION      There were no vitals filed for this visit.      Subjective Assessment - 09/07/16 1420    Subjective Son reports that after treatment pt did improve until she bent down to pick her cane and dizziness returned.  Son would like to re-test inner ears.  Pt still moves around in bed a lot.   Patient is accompained by: Family member   Limitations Lifting;Walking;Standing;House hold activities   Patient Stated Goals to get rid of headache & dizziness and be back to normal.    Currently in Pain? No/denies                Vestibular Assessment - 09/07/16 1421      Vestibular Assessment   General Observation more steady, not holding son's UE     Symptom Behavior   Type of Dizziness Lightheadedness   Frequency of Dizziness daily   Duration of Dizziness <1 minute    Aggravating Factors Lying supine;Supine to sit;Sit to stand;Rolling to right;Rolling to left;Forward  bending   Relieving Factors Head stationary;Slow movements     Positional Testing   Dix-Hallpike Dix-Hallpike Right;Dix-Hallpike Left   Horizontal Canal Testing Horizontal Canal Right;Horizontal Canal Left     Dix-Hallpike Right   Dix-Hallpike Right Duration 2-3s   Dix-Hallpike Right Symptoms Upbeat, right rotatory nystagmus  combined with horizontal ageotropic nystagmus     Dix-Hallpike Left   Dix-Hallpike Left Duration 0   Dix-Hallpike Left Symptoms No nystagmus     Horizontal Canal Right   Horizontal Canal Right Duration >60s    Horizontal Canal Right Symptoms Ageotrophic     Horizontal Canal Left   Horizontal Canal Left Duration >60s   Horizontal Canal Left Symptoms Ageotrophic  less symptomatic to L    Pt with multi-canal involvement; pt treated for R posterior canal canalithiasis, L horizontal canal cupulo and canalithiasis, R horizontal canal canalithiasis.          [facility] Adult PT Treatment/Exercise - 09/07/16 1541      Self-Care   Self-Care Other Self-Care Comments   Other Self-Care Comments  PT educated patient on fall risk today as she was unsteady after maneuvers.  We sat x 3 minutes edge of mat and walked with patient/ son to the car in order to ensure safety leaving clinic.  Also discussed widening base of support for improved stability.  Vestibular Treatment/Exercise - 09/07/16 1400      Vestibular Treatment/Exercise   Vestibular Treatment Provided Canalith Repositioning   Canalith Repositioning Epley Manuever Right;Canal Roll Left      EPLEY MANUEVER RIGHT   Number of Reps  1   Overall Response No change   Response Details  multi-canal involvement     Canal Roll Left   Number of Reps  1   Overall Response  Improved Symptoms  no nystagmus noted with L rolling, cont'd R symptoms   Response Details  PT reassessed nystagmus prior to performing tx for L cupulolithiasis.  Nystagmus had converted to geotropic direction and were equal in severity  bilaterally.  PT proceeded with L horizontal canalith repositioning maneuver.  Retested and patient symptoms are worse to right side.  Therefore, treated for R horizontal canal with canolith repositioning.                   PT Short Term Goals - 08/31/16 1605      PT SHORT TERM GOAL #1   Title Patient demonstrates initial HEP.  (Target Date: 09/28/2016)   Time 1   Period Months   Status New     PT SHORT TERM GOAL #2   Title Berg Balance Test >/= 45/56 to indicate lower fall risk.  (Target Date: 09/28/2016)   Time 1   Period Months   Status New     PT SHORT TERM GOAL #3   Title Cognitive Timed Up-Go with cane <24sec.    Time 1   Period Months   Status New     PT SHORT TERM GOAL #4   Title Pt will report 1/10 dizziness with rolling and supine<>sit    Time 1   Period Months   Status Revised     PT SHORT TERM GOAL #5   Title Patient ambulates with cane scanning environment without balance loss.   (Target Date: 09/28/2016)   Time 1   Period Months   Status New           PT Long Term Goals - 08/31/16 1606      PT LONG TERM GOAL #1   Title Patient demonstrates understanding of ongoing fitness plan / HEP.  (Target Date: 10/26/2016)   Time 2   Period Months   Status New     PT LONG TERM GOAL #2   Title Patient has negative testing (0/10) for positional vertigo / dizziness.   (Target Date: 10/26/2016)   Time 2   Period Months   Status New     PT LONG TERM GOAL #3   Title Berg Balance >48/56 to indicate lower fall risk.  (Target Date: 10/26/2016)   Time 2   Period Months   Status New     PT LONG TERM GOAL #4   Title Timed Up-Go <13 sec and Cognitive TUG <20 sec to indicate lower fall risk.   (Target Date: 10/26/2016)   Time 2   Period Months   Status New     PT LONG TERM GOAL #5   Title Patient ambulates 1000' with cane or less modified independent.  (Target Date: 10/26/2016)   Time 2   Period Months   Status New     PT LONG TERM GOAL #6   Title Patient  negotiates ramps, curbs & stairs (1 rail) with cane or less modified independent for community access.   (Target Date: 10/26/2016)   Time 2   Period Months   Status  New     PT LONG TERM GOAL #7   Title Complete further vestibular testing & set LTG.  (Target Date: 10/26/2016)   Time 2   Period Months   Status New               Plan - 09/07/16 1623    Clinical Impression Statement Today's skilled session focused on assessing and treating patient's positional vertigo. Patient presented with R posterior canal canalithiasis, treated x 1 with CRM.  Patient handed off to second PT for further treatment and assessment.  During roll test pt presented with ageotropic nystagmus in both L and R horizontal roll, but L was less symptomatic indicating L horizontal canal cupulolithiasis. During treatment for L horizontal cupulolithiasis, patient's nystagmus had converted to geotropic. Patient was treated for L horizontal canalithiasis with CRM. Returned to seated position to rest. Then when retested, geotropic nystagmus was present with R roll. PT treated R horizontal canalithiasis with CRM. When retested, patient did not present with nystagmus with L horizontal roll, but is still symptomatic to the R side. Patient was unsteady when leaving requiring hand held assistance and her cane. Patient and patient's son educated to be cautious for the remainder of the day due to the intensity of today's treatment. PT will continue to monitor and treat as indicated.    Rehab Potential Good   PT Treatment/Interventions ADLs/Self Care Home Management;DME Instruction;Gait training;Stair training;Functional mobility training;Therapeutic activities;Therapeutic exercise;Balance training;Neuromuscular re-education;Patient/family education;Vestibular;Canalith Repostioning   PT Next Visit Plan reassess canals and treat as indicated    Consulted and Agree with Plan of Care Patient;Family member/caregiver   Family Member Consulted  son      Patient will benefit from skilled therapeutic intervention in order to improve the following deficits and impairments:  Abnormal gait, Decreased activity tolerance, Decreased balance, Cardiopulmonary status limiting activity, Decreased endurance, Decreased mobility, Decreased strength, Dizziness  Visit Diagnosis: Dizziness and giddiness  Unsteadiness on feet  Other abnormalities of gait and mobility  History of falling  BPPV (benign paroxysmal positional vertigo), right  Muscle weakness (generalized)     Problem List There are no active problems to display for this patient.   Edman Circle, PT, [name] 09/08/16    9:44 AM    [facility] [address] [place], [place], [address] Phone: [phone]   Fax:  [phone]  Name: Kerry Carlson MRN: [id number] Date of Birth: 08-Jun-1925

## 2016-09-11 ENCOUNTER — Encounter: Payer: Self-pay | Admitting: Physical Therapy

## 2016-09-11 ENCOUNTER — Ambulatory Visit: Payer: Medicare Other | Admitting: Physical Therapy

## 2016-09-11 DIAGNOSIS — H8111 Benign paroxysmal vertigo, right ear: Secondary | ICD-10-CM | POA: Diagnosis not present

## 2016-09-11 DIAGNOSIS — R2681 Unsteadiness on feet: Secondary | ICD-10-CM

## 2016-09-11 DIAGNOSIS — R42 Dizziness and giddiness: Secondary | ICD-10-CM

## 2016-09-11 DIAGNOSIS — Z9181 History of falling: Secondary | ICD-10-CM | POA: Diagnosis not present

## 2016-09-11 DIAGNOSIS — M6281 Muscle weakness (generalized): Secondary | ICD-10-CM | POA: Diagnosis not present

## 2016-09-11 DIAGNOSIS — R2689 Other abnormalities of gait and mobility: Secondary | ICD-10-CM | POA: Diagnosis not present

## 2016-09-11 NOTE — Therapy (Signed)
[facility] [address] [place], [place], [address] Phone: [phone]   Fax:  [phone]  Physical Therapy Treatment  Patient Details  Name: Kerry Carlson MRN: [id number] Date of Birth: 09-26-1925 Referring Provider: Guerry Bruin, MD  Encounter Date: 09/11/2016      PT End of Session - 09/11/16 2047    Visit Number 5   Number of Visits 18   Date for PT Re-Evaluation 10/26/16   Authorization Type Medicare G-Code every 10th visit   PT Start Time 1446   PT Stop Time 1535   PT Time Calculation (min) 49 min   Activity Tolerance Patient tolerated treatment well   Behavior During Therapy [facility] for tasks assessed/performed      Past Medical History:  Diagnosis Date  . Diabetes mellitus without complication (HCC)    Type 2  . Hyperlipidemia   . Hypertension   . Thyroid disease    Hypothyroid    Past Surgical History:  Procedure Laterality Date  . ABDOMINAL HYSTERECTOMY    . PACEMAKER INSERTION      There were no vitals filed for this visit.      Subjective Assessment - 09/11/16 2045    Subjective Patient reports she is still just as dizzy as she was. Son reports she seems better. She reports she gets "faint feeling" whenever she rolls on her left side, lasting seconds. She gets a similar feeling going from side to sit and sit to stand.    Patient is accompained by: Family member   Limitations Lifting;Walking;Standing;House hold activities   Patient Stated Goals to get rid of headache & dizziness and be back to normal.    Currently in Pain? No/denies                Vestibular Assessment - 09/11/16 1725      Vestibular Assessment   General Observation using cane; no imbalance noted, but cautious gait     Symptom Behavior   Type of Dizziness Lightheadedness   Frequency of Dizziness daily   Duration of Dizziness <1 minute    Aggravating Factors Supine to sit;Rolling to left   Relieving  Factors Head stationary;Slow movements     Occulomotor Exam   Occulomotor Alignment Normal   Spontaneous Absent     Positional Testing   Dix-Hallpike Dix-Hallpike Right;Dix-Hallpike Left   Horizontal Canal Testing Horizontal Canal Right;Horizontal Canal Left;Horizontal Canal Right Intensity;Horizontal Canal Left Intensity     Dix-Hallpike Right   Dix-Hallpike Right Duration 0   Dix-Hallpike Right Symptoms No nystagmus     Dix-Hallpike Left   Dix-Hallpike Left Duration 3 sec   Dix-Hallpike Left Symptoms No nystagmus  dizzy     Horizontal Canal Right   Horizontal Canal Right Duration 5 sec   Horizontal Canal Right Symptoms Geotrophic     Horizontal Canal Left   Horizontal Canal Left Duration 0   Horizontal Canal Left Symptoms Normal     Horizontal Canal Right Intensity   Horizontal Canal Right Intensity Mild   Right Intensity Comment unable to reproduce with repeated testing     Horizontal Canal Left Intensity   Left Intensity Comment 0                  Vestibular Treatment/Exercise - 09/11/16 1728      Vestibular Treatment/Exercise   Vestibular Treatment Provided Canalith Repositioning   Canalith Repositioning Epley Manuever Left  taught prolonged positioning roll from right to left   Habituation Exercises  Horizontal Roll      EPLEY MANUEVER LEFT   Number of Reps  1   Overall Response  Improved Symptoms               PT Education - 09/11/16 2046    Education provided Yes   Education Details horizontal roll from rt to lt sidelying with prolonged positioning; repeat if she rolls off her left side or gets up during the night   Person(s) Educated Patient;Child(ren)   Methods Explanation;Demonstration   Comprehension Verbalized understanding          PT Short Term Goals - 08/31/16 1605      PT SHORT TERM GOAL #1   Title Patient demonstrates initial HEP.  (Target Date: 09/28/2016)   Time 1   Period Months   Status New     PT SHORT TERM GOAL  #2   Title Berg Balance Test >/= 45/56 to indicate lower fall risk.  (Target Date: 09/28/2016)   Time 1   Period Months   Status New     PT SHORT TERM GOAL #3   Title Cognitive Timed Up-Go with cane <24sec.    Time 1   Period Months   Status New     PT SHORT TERM GOAL #4   Title Pt will report 1/10 dizziness with rolling and supine<>sit    Time 1   Period Months   Status Revised     PT SHORT TERM GOAL #5   Title Patient ambulates with cane scanning environment without balance loss.   (Target Date: 09/28/2016)   Time 1   Period Months   Status New           PT Long Term Goals - 08/31/16 1606      PT LONG TERM GOAL #1   Title Patient demonstrates understanding of ongoing fitness plan / HEP.  (Target Date: 10/26/2016)   Time 2   Period Months   Status New     PT LONG TERM GOAL #2   Title Patient has negative testing (0/10) for positional vertigo / dizziness.   (Target Date: 10/26/2016)   Time 2   Period Months   Status New     PT LONG TERM GOAL #3   Title Berg Balance >48/56 to indicate lower fall risk.  (Target Date: 10/26/2016)   Time 2   Period Months   Status New     PT LONG TERM GOAL #4   Title Timed Up-Go <13 sec and Cognitive TUG <20 sec to indicate lower fall risk.   (Target Date: 10/26/2016)   Time 2   Period Months   Status New     PT LONG TERM GOAL #5   Title Patient ambulates 1000' with cane or less modified independent.  (Target Date: 10/26/2016)   Time 2   Period Months   Status New     PT LONG TERM GOAL #6   Title Patient negotiates ramps, curbs & stairs (1 rail) with cane or less modified independent for community access.   (Target Date: 10/26/2016)   Time 2   Period Months   Status New     PT LONG TERM GOAL #7   Title Complete further vestibular testing & set LTG.  (Target Date: 10/26/2016)   Time 2   Period Months   Status New               Plan - 09/11/16 2047    Clinical Impression Statement Began session with  checking orthostatic BPs  based on pt's description of "faint feeling" lasting only seconds when she changes position. Patient with +orthostasis (SBP dropped 164 to 128) however she was asymptomatic. Patient reports she has been instructed to drink lots of water (64 oz) by her doctor and she is trying hard to do this. Again focused on positional vertigo. Patient with minimal symptoms with nystagmus with Lt Hallpike-Dix and completed Lt Epley. Testing of horizontal canals caused brief geotropic nystagmus with symptoms turning head to right. This was not reproducible with repeat testing, therefore Appiani deferred and educated on horizontal roll for positioning during sleep. Patient reported feeling well and appeared steady as she exited the clinic with her son.    Rehab Potential Good   PT Treatment/Interventions ADLs/Self Care Home Management;DME Instruction;Gait training;Stair training;Functional mobility training;Therapeutic activities;Therapeutic exercise;Balance training;Neuromuscular re-education;Patient/family education;Vestibular;Canalith Repostioning   PT Next Visit Plan Reassess for orthostasis (+drop 5/22 but asymptomatic); reassess canals and treat as indicated    Consulted and Agree with Plan of Care Patient;Family member/caregiver   Family Member Consulted son      Patient will benefit from skilled therapeutic intervention in order to improve the following deficits and impairments:  Abnormal gait, Decreased activity tolerance, Decreased balance, Cardiopulmonary status limiting activity, Decreased endurance, Decreased mobility, Decreased strength, Dizziness  Visit Diagnosis: Dizziness and giddiness  Unsteadiness on feet  BPPV (benign paroxysmal positional vertigo), right     Problem List There are no active problems to display for this patient.   Zena Amos, PT 09/11/2016, 8:55 PM  [facility] [address] [place], [place],  [address] Phone: [phone]   Fax:  [phone]  Name: MICHAELA BROSKI MRN: [id number] Date of Birth: 11/04/1925

## 2016-09-14 ENCOUNTER — Encounter: Payer: Self-pay | Admitting: Physical Therapy

## 2016-09-14 ENCOUNTER — Ambulatory Visit: Payer: Medicare Other | Admitting: Physical Therapy

## 2016-09-14 DIAGNOSIS — R2689 Other abnormalities of gait and mobility: Secondary | ICD-10-CM | POA: Diagnosis not present

## 2016-09-14 DIAGNOSIS — M6281 Muscle weakness (generalized): Secondary | ICD-10-CM | POA: Diagnosis not present

## 2016-09-14 DIAGNOSIS — R2681 Unsteadiness on feet: Secondary | ICD-10-CM

## 2016-09-14 DIAGNOSIS — R42 Dizziness and giddiness: Secondary | ICD-10-CM

## 2016-09-14 DIAGNOSIS — H8111 Benign paroxysmal vertigo, right ear: Secondary | ICD-10-CM | POA: Diagnosis not present

## 2016-09-14 DIAGNOSIS — Z9181 History of falling: Secondary | ICD-10-CM | POA: Diagnosis not present

## 2016-09-14 NOTE — Therapy (Signed)
[facility] [address] [place], [place], [address] Phone: [phone]   Fax:  [phone]  Physical Therapy Treatment  Patient Details  Name: Kerry Carlson MRN: [id number] Date of Birth: Dec 09, 1925 Referring Provider: Guerry Bruin, MD  Encounter Date: 09/14/2016      PT End of Session - 09/14/16 1656    Visit Number 6   Number of Visits 18   Date for PT Re-Evaluation 10/26/16   Authorization Type Medicare G-Code every 10th visit   PT Start Time 1450   PT Stop Time 1535   PT Time Calculation (min) 45 min   Activity Tolerance Other (comment)  dizziness increased   Behavior During Therapy [facility] for tasks assessed/performed      Past Medical History:  Diagnosis Date  . Diabetes mellitus without complication (HCC)    Type 2  . Hyperlipidemia   . Hypertension   . Thyroid disease    Hypothyroid    Past Surgical History:  Procedure Laterality Date  . ABDOMINAL HYSTERECTOMY    . PACEMAKER INSERTION      There were no vitals filed for this visit.      Subjective Assessment - 09/14/16 1453    Subjective Pt reports feeling much better since treatment on Tuesday.  Still wakes up with mild dizziness but is much improved once up and moving around.   Patient is accompained by: Family member   Limitations Lifting;Walking;Standing;House hold activities   Patient Stated Goals to get rid of headache & dizziness and be back to normal.    Currently in Pain? No/denies                Vestibular Assessment - 09/14/16 1455      Vestibular Assessment   General Observation using cane, reports feeling much better     Symptom Behavior   Type of Dizziness Spinning  but reports feeling like she is going to faint   Frequency of Dizziness intermittently   Duration of Dizziness seconds   Aggravating Factors Supine to sit   Relieving Factors Head stationary     Positional Testing   Horizontal Canal Testing  Horizontal Canal Right;Horizontal Canal Left     Horizontal Canal Right   Horizontal Canal Right Duration 2-3 seconds first time, 5-6 second repetition   Horizontal Canal Right Symptoms Geotrophic  none during first rep, geotropic second rep     Horizontal Canal Left   Horizontal Canal Left Duration >1 min first rep, 2-3 seconds second rep   Horizontal Canal Left Symptoms Ageotrophic;Geotrophic  ageo first rep, switched to geo second rep                 [facility] Adult PT Treatment/Exercise - 09/14/16 1655      Ambulation/Gait   Ambulation/Gait Yes   Ambulation/Gait Assistance 4: Min guard   Ambulation Distance (Feet) 250 Feet   Assistive device Straight cane   Ambulation Surface Level;Indoor  with head turns and nods          Vestibular Treatment/Exercise - 09/14/16 1514      Vestibular Treatment/Exercise   Vestibular Treatment Provided Canalith Repositioning   Canalith Repositioning Canal Roll Right     Canal Roll Right   Number of Reps  2   Overall Response  No change   Response Details  felt more disequilibrium after second repetition               PT Education - 09/14/16 1656  Education provided Yes   Education Details continue habituation repeated rolling   Person(s) Educated Patient;Child(ren)   Methods Explanation;Demonstration   Comprehension Verbalized understanding          PT Short Term Goals - 08/31/16 1605      PT SHORT TERM GOAL #1   Title Patient demonstrates initial HEP.  (Target Date: 09/28/2016)   Time 1   Period Months   Status New     PT SHORT TERM GOAL #2   Title Berg Balance Test >/= 45/56 to indicate lower fall risk.  (Target Date: 09/28/2016)   Time 1   Period Months   Status New     PT SHORT TERM GOAL #3   Title Cognitive Timed Up-Go with cane <24sec.    Time 1   Period Months   Status New     PT SHORT TERM GOAL #4   Title Pt will report 1/10 dizziness with rolling and supine<>sit    Time 1   Period Months    Status Revised     PT SHORT TERM GOAL #5   Title Patient ambulates with cane scanning environment without balance loss.   (Target Date: 09/28/2016)   Time 1   Period Months   Status New           PT Long Term Goals - 08/31/16 1606      PT LONG TERM GOAL #1   Title Patient demonstrates understanding of ongoing fitness plan / HEP.  (Target Date: 10/26/2016)   Time 2   Period Months   Status New     PT LONG TERM GOAL #2   Title Patient has negative testing (0/10) for positional vertigo / dizziness.   (Target Date: 10/26/2016)   Time 2   Period Months   Status New     PT LONG TERM GOAL #3   Title Berg Balance >48/56 to indicate lower fall risk.  (Target Date: 10/26/2016)   Time 2   Period Months   Status New     PT LONG TERM GOAL #4   Title Timed Up-Go <13 sec and Cognitive TUG <20 sec to indicate lower fall risk.   (Target Date: 10/26/2016)   Time 2   Period Months   Status New     PT LONG TERM GOAL #5   Title Patient ambulates 1000' with cane or less modified independent.  (Target Date: 10/26/2016)   Time 2   Period Months   Status New     PT LONG TERM GOAL #6   Title Patient negotiates ramps, curbs & stairs (1 rail) with cane or less modified independent for community access.   (Target Date: 10/26/2016)   Time 2   Period Months   Status New     PT LONG TERM GOAL #7   Title Complete further vestibular testing & set LTG.  (Target Date: 10/26/2016)   Time 2   Period Months   Status New               Plan - 09/14/16 1657    Clinical Impression Statement Continued assessment of all canals with pt initially demonstrating ageotropic nystagmus on L which then converted quickly to geotropic on R side.  Pt treated x 2 with barbeque roll with disequilibrium after second repetition but able to ambulate with close supervision while performing head turns.  Advised pt to continue with habituation repeated rolling; will continue to assess and address as pt tolerates.   Rehab  Potential  Good   PT Frequency 2x / week   PT Duration Other (comment)   PT Treatment/Interventions ADLs/Self Care Home Management;DME Instruction;Gait training;Stair training;Functional mobility training;Therapeutic activities;Therapeutic exercise;Balance training;Neuromuscular re-education;Patient/family education;Vestibular;Canalith Repostioning   PT Next Visit Plan assess x 1 viewing in standing and progress for HEP if able; continue to work on balance, SLS, foot clearance during gait, gait with head turns, compliant sufaces; reassess canals and treat as indicated    Consulted and Agree with Plan of Care Patient;Family member/caregiver   Family Member Consulted son      Patient will benefit from skilled therapeutic intervention in order to improve the following deficits and impairments:  Abnormal gait, Decreased activity tolerance, Decreased balance, Cardiopulmonary status limiting activity, Decreased endurance, Decreased mobility, Decreased strength, Dizziness  Visit Diagnosis: Dizziness and giddiness  Unsteadiness on feet  BPPV (benign paroxysmal positional vertigo), right  Other abnormalities of gait and mobility  History of falling  Muscle weakness (generalized)     Problem List There are no active problems to display for this patient.  Edman CircleAudra Hall, PT, [name] 09/14/16    5:14 PM    [facility] [address] [place], [place], [address] Phone: [phone]   Fax:  [phone]  Name: Kerry Carlson MRN: [id number] Date of Birth: 06/02/1925

## 2016-09-18 ENCOUNTER — Ambulatory Visit: Payer: Medicare Other | Admitting: Physical Therapy

## 2016-09-18 ENCOUNTER — Encounter: Payer: Self-pay | Admitting: Physical Therapy

## 2016-09-18 DIAGNOSIS — R2681 Unsteadiness on feet: Secondary | ICD-10-CM

## 2016-09-18 DIAGNOSIS — R2689 Other abnormalities of gait and mobility: Secondary | ICD-10-CM

## 2016-09-18 DIAGNOSIS — H8111 Benign paroxysmal vertigo, right ear: Secondary | ICD-10-CM | POA: Diagnosis not present

## 2016-09-18 DIAGNOSIS — R42 Dizziness and giddiness: Secondary | ICD-10-CM | POA: Diagnosis not present

## 2016-09-18 DIAGNOSIS — Z9181 History of falling: Secondary | ICD-10-CM | POA: Diagnosis not present

## 2016-09-18 DIAGNOSIS — M6281 Muscle weakness (generalized): Secondary | ICD-10-CM | POA: Diagnosis not present

## 2016-09-19 NOTE — Therapy (Signed)
[facility] [address] [place], [place], [address] Phone: [phone]   Fax:  [phone]  Physical Therapy Treatment  Patient Details  Name: Kerry Carlson MRN: [id number] Date of Birth: 07/07/1925 Referring Provider: Guerry Bruinichard Tisovec, MD  Encounter Date: 09/18/2016      PT End of Session - 09/18/16 0809    Visit Number 7   Number of Visits 18   Date for PT Re-Evaluation 10/26/16   Authorization Type Medicare G-Code every 10th visit   PT Start Time 0806   PT Stop Time 0845   PT Time Calculation (min) 39 min   Equipment Utilized During Treatment Gait belt   Activity Tolerance Patient tolerated treatment well;No increased pain   Behavior During Therapy WFL for tasks assessed/performed      Past Medical History:  Diagnosis Date  . Diabetes mellitus without complication (HCC)    Type 2  . Hyperlipidemia   . Hypertension   . Thyroid disease    Hypothyroid    Past Surgical History:  Procedure Laterality Date  . ABDOMINAL HYSTERECTOMY    . PACEMAKER INSERTION      There were no vitals filed for this visit.      Subjective Assessment - 09/18/16 0808    Subjective Continues to have dizziness in am and at night. Improves during the day. No falls or pain to report.    Patient is accompained by: Family member   Limitations Sitting   Patient Stated Goals to get rid of headache & dizziness and be back to normal.    Currently in Pain? No/denies   Pain Score 0-No pain             [facility] Adult PT Treatment/Exercise - 09/18/16 0826      Transfers   Transfers Sit to Stand;Stand to Sit   Sit to Stand 6: Modified independent (Device/Increase time);Without upper extremity assist;From chair/3-in-1   Stand to Sit 6: Modified independent (Device/Increase time);Without upper extremity assist;To chair/3-in-1     Ambulation/Gait   Ambulation/Gait Yes   Ambulation/Gait Assistance 4: Min guard   Assistive device  Straight cane  with rubber quad tip   Gait Pattern Step-through pattern;Decreased arm swing - right;Decreased stride length   Ambulation Surface Level;Indoor   Gait Comments gait along ~50 foot hallway: forward gait with head movements left<>right and up<>down x 4 laps each with min guard assist for balance.      High Level Balance   High Level Balance Activities Marching forwards;Marching backwards;Tandem walking  tandem fwd/bwd, toe walk fwd/bwd, heel walk fwd/bwd   High Level Balance Comments on red mats next to counter tops with single UE support: x 3 laps each way with min guard assist to min assist with cues on posture and ex form/technique          Vestibular Treatment/Exercise - 09/18/16 0810      X1 Viewing Horizontal   Foot Position apart in standing on floor   Time --  20 sec's   Reps 4  2 w/door background, 2 with busy background   Comments mild dizziness with 1st rep, resolved quickly. none with others     X1 Viewing Vertical   Foot Position standing with feet apart on floor   Time --  20 sec's   Reps 4  x2 w/door background, x2 w/busy background   Comments mild dizziness that resolved quickly            Balance Exercises - 09/18/16  0840      Balance Exercises: Standing   SLS with Vectors Solid surface;Other reps (comment);Limitations     Balance Exercises: Standing   SLS with Vectors Limitations 2 tall cones on floor: alternating toe taps x 10 reps each leg with min assist. Cues to slow down, for weight shifting and ex technique.              PT Short Term Goals - 08/31/16 1605      PT SHORT TERM GOAL #1   Title Patient demonstrates initial HEP.  (Target Date: 09/28/2016)   Time 1   Period Months   Status New     PT SHORT TERM GOAL #2   Title Berg Balance Test >/= 45/56 to indicate lower fall risk.  (Target Date: 09/28/2016)   Time 1   Period Months   Status New     PT SHORT TERM GOAL #3   Title Cognitive Timed Up-Go with cane <24sec.     Time 1   Period Months   Status New     PT SHORT TERM GOAL #4   Title Pt will report 1/10 dizziness with rolling and supine<>sit    Time 1   Period Months   Status Revised     PT SHORT TERM GOAL #5   Title Patient ambulates with cane scanning environment without balance loss.   (Target Date: 09/28/2016)   Time 1   Period Months   Status New           PT Long Term Goals - 08/31/16 1606      PT LONG TERM GOAL #1   Title Patient demonstrates understanding of ongoing fitness plan / HEP.  (Target Date: 10/26/2016)   Time 2   Period Months   Status New     PT LONG TERM GOAL #2   Title Patient has negative testing (0/10) for positional vertigo / dizziness.   (Target Date: 10/26/2016)   Time 2   Period Months   Status New     PT LONG TERM GOAL #3   Title Berg Balance >48/56 to indicate lower fall risk.  (Target Date: 10/26/2016)   Time 2   Period Months   Status New     PT LONG TERM GOAL #4   Title Timed Up-Go <13 sec and Cognitive TUG <20 sec to indicate lower fall risk.   (Target Date: 10/26/2016)   Time 2   Period Months   Status New     PT LONG TERM GOAL #5   Title Patient ambulates 1000' with cane or less modified independent.  (Target Date: 10/26/2016)   Time 2   Period Months   Status New     PT LONG TERM GOAL #6   Title Patient negotiates ramps, curbs & stairs (1 rail) with cane or less modified independent for community access.   (Target Date: 10/26/2016)   Time 2   Period Months   Status New     PT LONG TERM GOAL #7   Title Complete further vestibular testing & set LTG.  (Target Date: 10/26/2016)   Time 2   Period Months   Status New            Plan - 09/18/16 0809    Clinical Impression Statement Today's skilled session focused on high level balance activities with mild dizziness reported at times that resolved quickly. Pt is making steady progress toward goals and should benefit from continued PT to progress toward unmet goals.  Rehab Potential Good    PT Frequency 2x / week   PT Duration Other (comment)   PT Treatment/Interventions ADLs/Self Care Home Management;DME Instruction;Gait training;Stair training;Functional mobility training;Therapeutic activities;Therapeutic exercise;Balance training;Neuromuscular re-education;Patient/family education;Vestibular;Canalith Repostioning   PT Next Visit Plan begin checking STG progress (due 09/26/16), continued with high level balance, continue to reassess canals and treat as indicated   Consulted and Agree with Plan of Care Patient;Family member/caregiver   Family Member Consulted son      Patient will benefit from skilled therapeutic intervention in order to improve the following deficits and impairments:  Abnormal gait, Decreased activity tolerance, Decreased balance, Cardiopulmonary status limiting activity, Decreased endurance, Decreased mobility, Decreased strength, Dizziness  Visit Diagnosis: Dizziness and giddiness  Unsteadiness on feet  Other abnormalities of gait and mobility     Problem List There are no active problems to display for this patient.   Sallyanne Kuster, PTA, [facility] [address], [address] [place], [place] [address] [phone] 09/19/16, 10:06 AM   Name: Kerry Carlson MRN: [id number] Date of Birth: August 20, 1925

## 2016-09-21 ENCOUNTER — Encounter: Payer: Self-pay | Admitting: Physical Therapy

## 2016-09-21 ENCOUNTER — Ambulatory Visit: Payer: Medicare Other | Attending: Internal Medicine | Admitting: Physical Therapy

## 2016-09-21 DIAGNOSIS — M6281 Muscle weakness (generalized): Secondary | ICD-10-CM | POA: Insufficient documentation

## 2016-09-21 DIAGNOSIS — H8111 Benign paroxysmal vertigo, right ear: Secondary | ICD-10-CM | POA: Insufficient documentation

## 2016-09-21 DIAGNOSIS — Z9181 History of falling: Secondary | ICD-10-CM | POA: Diagnosis not present

## 2016-09-21 DIAGNOSIS — R2681 Unsteadiness on feet: Secondary | ICD-10-CM | POA: Insufficient documentation

## 2016-09-21 DIAGNOSIS — R2689 Other abnormalities of gait and mobility: Secondary | ICD-10-CM | POA: Diagnosis not present

## 2016-09-21 DIAGNOSIS — R42 Dizziness and giddiness: Secondary | ICD-10-CM | POA: Diagnosis not present

## 2016-09-21 NOTE — Therapy (Signed)
[facility] [address] [place], [place], [address] Phone: [phone]   Fax:  [phone]  Physical Therapy Treatment  Patient Details  Name: Kerry Carlson MRN: [id number] Date of Birth: Sep 21, 1925 Referring Provider: Guerry Bruin, MD  Encounter Date: 09/21/2016      PT End of Session - 09/21/16 1539    Visit Number 8   Number of Visits 18   Date for PT Re-Evaluation 10/26/16   Authorization Type Medicare G-Code every 10th visit   PT Start Time 1449   PT Stop Time 1533   PT Time Calculation (min) 44 min   Activity Tolerance Patient tolerated treatment well   Behavior During Therapy [facility] for tasks assessed/performed      Past Medical History:  Diagnosis Date  . Diabetes mellitus without complication (HCC)    Type 2  . Hyperlipidemia   . Hypertension   . Thyroid disease    Hypothyroid    Past Surgical History:  Procedure Laterality Date  . ABDOMINAL HYSTERECTOMY    . PACEMAKER INSERTION      There were no vitals filed for this visit.      Subjective Assessment - 09/21/16 1453    Subjective Reports having significant back pain since session on Tuesday; used heating pad last night with improvement in pain.  Still having vertigo in the morning and at night.   Patient is accompained by: Family member   Limitations Sitting   Patient Stated Goals to get rid of headache & dizziness and be back to normal.    Currently in Pain? Yes   Pain Score 5    Pain Location Back   Pain Orientation Lower   Pain Descriptors / Indicators Constant;Discomfort   Pain Type Chronic pain                Vestibular Assessment - 09/21/16 1458      Positional Testing   Dix-Hallpike Dix-Hallpike Right;Dix-Hallpike Left   Horizontal Canal Testing Horizontal Canal Right;Horizontal Canal Left     Dix-Hallpike Right   Dix-Hallpike Right Duration 0   Dix-Hallpike Right Symptoms No nystagmus     Dix-Hallpike Left    Dix-Hallpike Left Duration 0   Dix-Hallpike Left Symptoms No nystagmus     Horizontal Canal Right   Horizontal Canal Right Duration 0   Horizontal Canal Right Symptoms Normal     Horizontal Canal Left   Horizontal Canal Left Duration 0   Horizontal Canal Left Symptoms Normal                  Vestibular Treatment/Exercise - 09/21/16 1509      Vestibular Treatment/Exercise   Habituation Exercises Standing Horizontal Head Turns;Standing Vertical Head Turns     Standing Horizontal Head Turns   Number of Reps  10   Symptom Description  standing on ramp, feet facing up and then down, EO and then EC     Standing Vertical Head Turns   Number of Reps  10   Symptom Description  standing on ramp with feet facing up and then down, EO and then [facility]            Balance Exercises - 09/21/16 1522      Balance Exercises: Standing   Other Standing Exercises standing on ramp feet facing up and then down performing 10 reps each forwards/retro steps with forwards and retro weight shifting           PT Education - 09/21/16 1538  Education provided Yes   Education Details cease repeated rolling, use of diagram to visualize inner ear and BPPV   Person(s) Educated Patient;Child(ren)   Methods Explanation;Demonstration   Comprehension Verbalized understanding          PT Short Term Goals - 08/31/16 1605      PT SHORT TERM GOAL #1   Title Patient demonstrates initial HEP.  (Target Date: 09/28/2016)   Time 1   Period Months   Status New     PT SHORT TERM GOAL #2   Title Berg Balance Test >/= 45/56 to indicate lower fall risk.  (Target Date: 09/28/2016)   Time 1   Period Months   Status New     PT SHORT TERM GOAL #3   Title Cognitive Timed Up-Go with cane <24sec.    Time 1   Period Months   Status New     PT SHORT TERM GOAL #4   Title Pt will report 1/10 dizziness with rolling and supine<>sit    Time 1   Period Months   Status Revised     PT SHORT TERM GOAL  #5   Title Patient ambulates with cane scanning environment without balance loss.   (Target Date: 09/28/2016)   Time 1   Period Months   Status New           PT Long Term Goals - 08/31/16 1606      PT LONG TERM GOAL #1   Title Patient demonstrates understanding of ongoing fitness plan / HEP.  (Target Date: 10/26/2016)   Time 2   Period Months   Status New     PT LONG TERM GOAL #2   Title Patient has negative testing (0/10) for positional vertigo / dizziness.   (Target Date: 10/26/2016)   Time 2   Period Months   Status New     PT LONG TERM GOAL #3   Title Berg Balance >48/56 to indicate lower fall risk.  (Target Date: 10/26/2016)   Time 2   Period Months   Status New     PT LONG TERM GOAL #4   Title Timed Up-Go <13 sec and Cognitive TUG <20 sec to indicate lower fall risk.   (Target Date: 10/26/2016)   Time 2   Period Months   Status New     PT LONG TERM GOAL #5   Title Patient ambulates 1000' with cane or less modified independent.  (Target Date: 10/26/2016)   Time 2   Period Months   Status New     PT LONG TERM GOAL #6   Title Patient negotiates ramps, curbs & stairs (1 rail) with cane or less modified independent for community access.   (Target Date: 10/26/2016)   Time 2   Period Months   Status New     PT LONG TERM GOAL #7   Title Complete further vestibular testing & set LTG.  (Target Date: 10/26/2016)   Time 2   Period Months   Status New               Plan - 09/21/16 1539    Clinical Impression Statement Treatment session focused on continued assessment of all peripheral vestibular canals-no vertigo reported and no nystagmus observed.  All canals cleared.  Pt advised to cease habituation repeated rolling but did advise pt to perform supine > sit sequentially in the morning (pt tends to get up quickly to urinate) by rolling first and waiting x 1 min, side > sit  and wait x 1 min prior to standing and ambulating.  Continued balance, weight shifting and vestibular  training standing on ramp with head turns with and without vision.  Pt tolerated well and required only one rest break due to back pain.  During rest break pt asking about diagram of inner ear; used diagram to educate pt and son on location of canals, otoconia and mechanism of BPPV.  Will continue to address and progress towards LTG.   Rehab Potential Good   PT Frequency 2x / week   PT Duration Other (comment)   PT Treatment/Interventions ADLs/Self Care Home Management;DME Instruction;Gait training;Stair training;Functional mobility training;Therapeutic activities;Therapeutic exercise;Balance training;Neuromuscular re-education;Patient/family education;Vestibular;Canalith Repostioning   PT Next Visit Plan begin checking STG progress (due 09/26/16), G CODE IN 2 VISITS; continued with high level balance and vestibular training, stairs.  Canals cleared on 6/1-reassess if pt symptoms return   Consulted and Agree with Plan of Care Patient;Family member/caregiver   Family Member Consulted son      Patient will benefit from skilled therapeutic intervention in order to improve the following deficits and impairments:  Abnormal gait, Decreased activity tolerance, Decreased balance, Cardiopulmonary status limiting activity, Decreased endurance, Decreased mobility, Decreased strength, Dizziness  Visit Diagnosis: Dizziness and giddiness  Unsteadiness on feet  Other abnormalities of gait and mobility  BPPV (benign paroxysmal positional vertigo), right  History of falling  Muscle weakness (generalized)     Problem List There are no active problems to display for this patient.  Edman CircleAudra Hall, PT, [name] 09/21/16    3:46 PM    [facility] [address] [place], [place], [address] Phone: [phone]   Fax:  [phone]  Name: Kerry Carlson MRN: [id number] Date of Birth: 01/02/1926

## 2016-09-27 ENCOUNTER — Ambulatory Visit: Payer: Medicare Other | Admitting: Physical Therapy

## 2016-09-27 ENCOUNTER — Encounter: Payer: Self-pay | Admitting: Physical Therapy

## 2016-09-27 DIAGNOSIS — M6281 Muscle weakness (generalized): Secondary | ICD-10-CM

## 2016-09-27 DIAGNOSIS — R2681 Unsteadiness on feet: Secondary | ICD-10-CM | POA: Diagnosis not present

## 2016-09-27 DIAGNOSIS — Z9181 History of falling: Secondary | ICD-10-CM | POA: Diagnosis not present

## 2016-09-27 DIAGNOSIS — R42 Dizziness and giddiness: Secondary | ICD-10-CM | POA: Diagnosis not present

## 2016-09-27 DIAGNOSIS — H8111 Benign paroxysmal vertigo, right ear: Secondary | ICD-10-CM | POA: Diagnosis not present

## 2016-09-27 DIAGNOSIS — R2689 Other abnormalities of gait and mobility: Secondary | ICD-10-CM | POA: Diagnosis not present

## 2016-09-27 NOTE — Therapy (Signed)
[facility] [address] [place], [place], [address] Phone: [phone]   Fax:  [phone]  Physical Therapy Treatment  Patient Details  Name: Kerry Carlson MRN: [id number] Date of Birth: 29-Apr-1925 Referring Provider: Domenick Gong, MD  Encounter Date: 09/27/2016      PT End of Session - 09/27/16 2118    Visit Number 9   Number of Visits 18   Date for PT Re-Evaluation 10/26/16   Authorization Type Medicare G-Code every 10th visit   PT Start Time 1450   PT Stop Time 1533   PT Time Calculation (min) 43 min   Activity Tolerance Patient tolerated treatment well   Behavior During Therapy [facility] for tasks assessed/performed      Past Medical History:  Diagnosis Date  . Diabetes mellitus without complication (HCC)    Type 2  . Hyperlipidemia   . Hypertension   . Thyroid disease    Hypothyroid    Past Surgical History:  Procedure Laterality Date  . ABDOMINAL HYSTERECTOMY    . PACEMAKER INSERTION      There were no vitals filed for this visit.      Subjective Assessment - 09/27/16 1452    Subjective Ambulating without cane today; son present.  Back pain remains unchanged.     Patient is accompained by: Family member   Limitations Sitting   Patient Stated Goals to get rid of headache & dizziness and be back to normal.    Currently in Pain? Yes   Pain Score 5    Pain Location Back   Pain Orientation Lower   Pain Descriptors / Indicators Constant;Discomfort   Pain Type Chronic pain            [facility] PT Assessment - 09/27/16 1455      Bed Mobility   Bed Mobility Rolling Right;Rolling Left   Rolling Right 7: Independent   Rolling Left 7: Independent  1/10 brief dizziness with rolling L and R, no nystagmus     Ambulation/Gait   Ambulation/Gait Yes   Ambulation/Gait Assistance 6: Modified independent (Device/Increase time)   Ambulation Distance (Feet) 250 Feet   Assistive device None   Gait  Pattern Step-through pattern;Decreased step length - right;Decreased step length - left;Decreased stride length;Shuffle;Decreased trunk rotation;Poor foot clearance - left;Poor foot clearance - right   Ambulation Surface Level;Indoor   Gait Comments gait indoors with various head turns without LOB but pt had to slow gait velocity to attend to head turns     Standardized Balance Assessment   Standardized Balance Assessment Berg Balance Test;Timed Up and Go Test     Berg Balance Test   Sit to Stand Able to stand without using hands and stabilize independently   Standing Unsupported Able to stand safely 2 minutes   Sitting with Back Unsupported but Feet Supported on Floor or Stool Able to sit safely and securely 2 minutes   Stand to Sit Sits safely with minimal use of hands   Transfers Able to transfer safely, minor use of hands   Standing Unsupported with Eyes Closed Able to stand 10 seconds safely   Standing Ubsupported with Feet Together Able to place feet together independently and stand 1 minute safely   From Standing, Reach Forward with Outstretched Arm Can reach confidently >25 cm (10")   From Standing Position, Pick up Object from Floor Able to pick up shoe safely and easily   From Standing Position, Turn to Look Behind Over each Shoulder Looks  behind from both sides and weight shifts well   Turn 360 Degrees Able to turn 360 degrees safely in 4 seconds or less   Standing Unsupported, Alternately Place Feet on Step/Stool Able to stand independently and safely and complete 8 steps in 20 seconds   Standing Unsupported, One Foot in Front Able to take small step independently and hold 30 seconds   Standing on One Leg Tries to lift leg/unable to hold 3 seconds but remains standing independently   Total Score 51   Berg comment: 51/56     Timed Up and Go Test   TUG Normal TUG;Cognitive TUG   Normal TUG (seconds) 10.85  without cane, no dizziness   Cognitive TUG (seconds) 23.71  animal  naming task; 218% increase in time                              PT Education - 09/27/16 2117    Education provided Yes   Education Details progress towards LTG; upgraded LTG and areas of continued focus for therapy   Person(s) Educated Patient;Child(ren)   Methods Explanation   Comprehension Verbalized understanding          PT Short Term Goals - 09/27/16 1524      PT SHORT TERM GOAL #1   Title Patient demonstrates initial HEP.  (Target Date: 09/28/2016)   Status Achieved     PT SHORT TERM GOAL #2   Title Berg Balance Test >/= 45/56 to indicate lower fall risk.  (Target Date: 09/28/2016)   Baseline 51/56   Status Achieved     PT SHORT TERM GOAL #3   Title Cognitive Timed Up-Go with cane <24sec.    Baseline 23 seconds with naming task   Status Achieved     PT SHORT TERM GOAL #4   Title Pt will report 1/10 dizziness with rolling and supine<>sit    Baseline 1/10   Status Achieved     PT SHORT TERM GOAL #5   Title Patient ambulates with cane scanning environment without balance loss.   (Target Date: 09/28/2016)   Baseline ambulates without cane with head turns without LOB but with slowed gait velocity   Status Achieved           PT Long Term Goals - 09/27/16 2123      PT LONG TERM GOAL #1   Title Patient demonstrates understanding of ongoing fitness plan / HEP.  (Target Date: 10/26/2016)   Time 2   Period Months   Status On-going     PT LONG TERM GOAL #2   Title Patient has negative testing (0/10) for positional vertigo / dizziness during bed mobility (rolling and supine <> sit) and when bending down to retrieve items from low surfaces/floor.   (Target Date: 10/26/2016)   Time 2   Period Months   Status Revised     PT LONG TERM GOAL #3   Title Berg Balance > or = to 52/56 to indicate lower fall risk.  (Target Date: 10/26/2016)   Time 2   Period Months   Status Revised     PT LONG TERM GOAL #4   Title Cognitive TUG <20 sec to indicate lower fall  risk during dual tasking.   (Target Date: 10/26/2016)   Time 2   Period Months   Status Revised     PT LONG TERM GOAL #5   Title Patient ambulates 1000' over uneven pavement/curb and negotiates 3  sets of 12 stairs with rail at MOD I  (Target Date: 10/26/2016)   Time 2   Period Months   Status Revised     PT LONG TERM GOAL #6   Title Pt will ambulate on indoor surfaces x 250' with head turns and changes in direction carrying shopping bags in bilat UE at MOD I level   Time 2   Period Months   Status Revised     PT LONG TERM GOAL #7   Title Complete further vestibular testing & set LTG.  (Target Date: 10/26/2016)   Baseline See LTG #2   Time --   Period --   Status Achieved               Plan - 09/27/16 2118    Clinical Impression Statement Treatment session today focused on re-assessment of STG; pt has met and exceeded all STG and has met some LTG.  Due to progress LTG upgraded.  Discussed areas of progress, areas of impairment to continue to be addressed and pt and family's goals with pt and son.  Pt has shown improvements in reports of vertigo with positional changes, improvements in balance and gait but will continue to benefit from PT services to maximize functional mobility independence and further decrease falls risk before return to [facility].   Rehab Potential Good   PT Frequency 2x / week   PT Duration Other (comment)   PT Treatment/Interventions ADLs/Self Care Home Management;DME Instruction;Gait training;Stair training;Functional mobility training;Therapeutic activities;Therapeutic exercise;Balance training;Neuromuscular re-education;Patient/family education;Vestibular;Canalith Repostioning   PT Next Visit Plan G CODE and PN NEXT VISIT; continued with high level balance and vestibular training-especially bending and reaching low for items, gait while carrying shopping bags-head turns/dual task, stairs-pt has to ascend 3 flights in [facility]!  Canals cleared on 6/1-reassess if pt symptoms  return   Consulted and Agree with Plan of Care Patient;Family member/caregiver   Family Member Consulted son      Patient will benefit from skilled therapeutic intervention in order to improve the following deficits and impairments:  Abnormal gait, Decreased activity tolerance, Decreased balance, Cardiopulmonary status limiting activity, Decreased endurance, Decreased mobility, Decreased strength, Dizziness  Visit Diagnosis: Dizziness and giddiness  Muscle weakness (generalized)  Unsteadiness on feet  Other abnormalities of gait and mobility  BPPV (benign paroxysmal positional vertigo), right  History of falling     Problem List There are no active problems to display for this patient.  Raylene Everts, PT, [name] 09/27/16    9:29 PM    [facility] [address] [place], [place], [address] Phone: [phone]   Fax:  [phone]  Name: Kerry Carlson MRN: [id number] Date of Birth: Mar 20, 1926

## 2016-09-28 ENCOUNTER — Ambulatory Visit: Payer: Medicare Other | Admitting: Physical Therapy

## 2016-09-28 ENCOUNTER — Encounter: Payer: Self-pay | Admitting: Physical Therapy

## 2016-09-28 DIAGNOSIS — R42 Dizziness and giddiness: Secondary | ICD-10-CM | POA: Diagnosis not present

## 2016-09-28 DIAGNOSIS — R2681 Unsteadiness on feet: Secondary | ICD-10-CM | POA: Diagnosis not present

## 2016-09-28 DIAGNOSIS — M6281 Muscle weakness (generalized): Secondary | ICD-10-CM | POA: Diagnosis not present

## 2016-09-28 DIAGNOSIS — R2689 Other abnormalities of gait and mobility: Secondary | ICD-10-CM

## 2016-09-28 DIAGNOSIS — Z9181 History of falling: Secondary | ICD-10-CM | POA: Diagnosis not present

## 2016-09-28 DIAGNOSIS — H8111 Benign paroxysmal vertigo, right ear: Secondary | ICD-10-CM | POA: Diagnosis not present

## 2016-09-29 NOTE — Therapy (Addendum)
[facility] [address] [place], [place], [address] Phone: [phone]   Fax:  [phone]  Physical Therapy Treatment  Patient Details  Name: Kerry Carlson MRN: [id number] Date of Birth: [birth year] Referring Provider: Guerry Bruin, MD  Encounter Date: 09/28/2016      PT End of Session - 09/28/16 1456    Visit Number 10   Number of Visits 18   Date for PT Re-Evaluation 10/26/16   Authorization Type Medicare G-Code every 10th visit   PT Start Time 1450   PT Stop Time 1530   PT Time Calculation (min) 40 min   Equipment Utilized During Treatment Gait belt   Activity Tolerance Patient tolerated treatment well   Behavior During Therapy [facility] for tasks assessed/performed      Past Medical History:  Diagnosis Date  . Diabetes mellitus without complication (HCC)    Type 2  . Hyperlipidemia   . Hypertension   . Thyroid disease    Hypothyroid    Past Surgical History:  Procedure Laterality Date  . ABDOMINAL HYSTERECTOMY    . PACEMAKER INSERTION      There were no vitals filed for this visit.      Subjective Assessment - 09/28/16 1454    Subjective No new complaints. No pain or falls to report. To clinic without cane again today.    Patient is accompained by: Family member   Limitations Sitting   Patient Stated Goals to get rid of headache & dizziness and be back to normal.    Currently in Pain? Yes  monitoring, not directley addressing    Pain Score 5    Pain Location Back   Pain Orientation Lower   Pain Descriptors / Indicators Aching;Discomfort;Constant   Pain Type Chronic pain   Pain Onset More than a month ago   Pain Frequency Constant   Aggravating Factors  walking   Pain Relieving Factors rest            [facility] Adult PT Treatment/Exercise - 09/28/16 1458      Transfers   Transfers Sit to Stand;Stand to Sit   Sit to Stand 6: Modified independent (Device/Increase time);Without upper  extremity assist;From chair/3-in-1   Stand to Sit 6: Modified independent (Device/Increase time);Without upper extremity assist;To chair/3-in-1     Ambulation/Gait   Ambulation/Gait Yes   Assistive device None   Ambulation Surface Level;Indoor   Stairs Yes   Gait Comments along ~50 foot hallway: forward gait with head movements left<>right x 4 laps, then up<>down x 4 laps. pt with decr gait speed and minor veering noted. min guard assist for balance with no AD used.                                     Balance Exercises - 09/28/16 1528      Balance Exercises: Standing   Rockerboard Anterior/posterior;Lateral;Head turns;EO;EC;20 seconds;10 reps   Balance Beam standing across blue foam beam: forward stepping to floor and back onto beam x 8 reps each side with light UE support on parallel bars. cues for posture, step length/height and weight shifting.                      Balance Exercises: Standing   Rebounder Limitations performed both ways on balance board with no UE support: EO rocking board with emphasis on tall posture; holding board steady with EC  for 20 sec's x 3 reps, trialed EC with head movement however pt reported dizziness. Switched to EO head movements left<>right, up<>down and diagonals both ways x 10 reps each. min to mod assist for balance needed with cues on posture and weight shifting to assist balance.                                       PT Short Term Goals - 09/27/16 1524      PT SHORT TERM GOAL #1   Title Patient demonstrates initial HEP.  (Target Date: 09/28/2016)   Status Achieved     PT SHORT TERM GOAL #2   Title Berg Balance Test >/= 45/56 to indicate lower fall risk.  (Target Date: 09/28/2016)   Baseline 51/56   Status Achieved     PT SHORT TERM GOAL #3   Title Cognitive Timed Up-Go with cane <24sec.    Baseline 23 seconds with naming task   Status Achieved     PT SHORT TERM GOAL #4   Title Pt will report 1/10 dizziness with rolling and  supine<>sit    Baseline 1/10   Status Achieved     PT SHORT TERM GOAL #5   Title Patient ambulates with cane scanning environment without balance loss.   (Target Date: 09/28/2016)   Baseline ambulates without cane with head turns without LOB but with slowed gait velocity   Status Achieved           PT Long Term Goals - 09/27/16 2123      PT LONG TERM GOAL #1   Title Patient demonstrates understanding of ongoing fitness plan / HEP.  (Target Date: 10/26/2016)   Time 2   Period Months   Status On-going     PT LONG TERM GOAL #2   Title Patient has negative testing (0/10) for positional vertigo / dizziness during bed mobility (rolling and supine <> sit) and when bending down to retrieve items from low surfaces/floor.   (Target Date: 10/26/2016)   Time 2   Period Months   Status Revised     PT LONG TERM GOAL #3   Title Berg Balance > or = to 52/56 to indicate lower fall risk.  (Target Date: 10/26/2016)   Time 2   Period Months   Status Revised     PT LONG TERM GOAL #4   Title Cognitive TUG <20 sec to indicate lower fall risk during dual tasking.   (Target Date: 10/26/2016)   Time 2   Period Months   Status Revised     PT LONG TERM GOAL #5   Title Patient ambulates 1000' over uneven pavement/curb and negotiates 3 sets of 12 stairs with rail at MOD I  (Target Date: 10/26/2016)   Time 2   Period Months   Status Revised     PT LONG TERM GOAL #6   Title Pt will ambulate on indoor surfaces x 250' with head turns and changes in direction carrying shopping bags in bilat UE at MOD I level   Time 2   Period Months   Status Revised     PT LONG TERM GOAL #7   Title Complete further vestibular testing & set LTG.  (Target Date: 10/26/2016)   Baseline See LTG #2   Time --   Period --   Status Achieved            Plan -  09/28/16 1456    Clinical Impression Statement Today's skilled session focused on high level balance activites with increased vestibular system imput needed. Pt  challenged with activities that had EC, reporting incr in dizziness at times that resolved with rest/opening of eyes. Pt is progressing towards goals and should benefit from continued PT to progress toward unmet goals.                     Rehab Potential Good   PT Frequency 2x / week   PT Duration Other (comment)   PT Treatment/Interventions ADLs/Self Care Home Management;DME Instruction;Gait training;Stair training;Functional mobility training;Therapeutic activities;Therapeutic exercise;Balance training;Neuromuscular re-education;Patient/family education;Vestibular;Canalith Repostioning   PT Next Visit Plan continued with high level balance and vestibular training-especially bending and reaching low for items, gait while carrying shopping bags-head turns/dual task, stairs-[facility]  Canals cleared on 6/1-reassess if pt symptoms return   Consulted and Agree with Plan of Care Patient;Family member/caregiver   Family Member Consulted son      Patient will benefit from skilled therapeutic intervention in order to improve the following deficits and impairments:  Abnormal gait, Decreased activity tolerance, Decreased balance, Cardiopulmonary status limiting activity, Decreased endurance, Decreased mobility, Decreased strength, Dizziness  Visit Diagnosis: Dizziness and giddiness  Muscle weakness (generalized)  Unsteadiness on feet  Other abnormalities of gait and mobility  History of falling       G-Codes - 09/28/16 1630    Functional Assessment Tool Used (Outpatient Only) Timed Up-Go 10.85sec and cognitive TUG 23.71 sec.  Berg Balance 51/56   Functional Limitation Mobility: Walking and moving around     Functional Assessment Tool Used (Outpatient Only)    Timed Up-Go 10.85sec and cognitive TUG 23.71 sec. Berg Balance 51/56   Functional Limitation   Mobility: Walking and moving around   Mobility: Walking and Moving Around Current Status [phone]([id number])    At least 1  percent but less than 20 percent impaired, limited or restricted   Mobility: Walking and Moving Around Goal Status [phone]([id number])    At least 1 percent but less than 20 percent impaired, limited or restricted   Mobility: Walking and Moving Around Discharge Status [phone]([phone])        Physical Therapy Progress Note  Dates of Reporting Period: 08/28/16 to 09/28/16  Objective Reports of Subjective Statement: See above  Objective Measurements: BERG, TUG, COG TUG-see above   Goal Update: See STG and LTG above-LTG upgraded  Plan: Continue POC  Reason Skilled Services are Required: To address central and peripheral vestibular dysfunction, impaired strength, impaired balance, impaired gait and decrease falls risk.  G code and PN entered by: Edman CircleAudra Hall, PT, [name] 10/01/16    2:48 PM    Problem List There are no active problems to display for this patient.   Sallyanne KusterKathy Shem Plemmons, PTA, [facility] [address], [address] [place], [place] [address] [phone] 09/29/16, 2:40 PM   Name: Kerry Carlson MRN: [id number] Date of Birth: 03/25/1926

## 2016-10-01 ENCOUNTER — Encounter: Payer: Self-pay | Admitting: Physical Therapy

## 2016-10-01 ENCOUNTER — Ambulatory Visit: Payer: Medicare Other | Admitting: Physical Therapy

## 2016-10-01 DIAGNOSIS — R42 Dizziness and giddiness: Secondary | ICD-10-CM

## 2016-10-01 DIAGNOSIS — H8111 Benign paroxysmal vertigo, right ear: Secondary | ICD-10-CM

## 2016-10-01 DIAGNOSIS — R2681 Unsteadiness on feet: Secondary | ICD-10-CM

## 2016-10-01 DIAGNOSIS — R2689 Other abnormalities of gait and mobility: Secondary | ICD-10-CM

## 2016-10-01 DIAGNOSIS — Z9181 History of falling: Secondary | ICD-10-CM

## 2016-10-01 DIAGNOSIS — M6281 Muscle weakness (generalized): Secondary | ICD-10-CM

## 2016-10-01 NOTE — Therapy (Signed)
[facility] [address] [place], [place], [address] Phone: [phone]   Fax:  [phone]  Physical Therapy Treatment  Patient Details  Name: Kerry Carlson MRN: [id number] Date of Birth: [birth year] Referring Provider: Guerry Bruin, MD  Encounter Date: 10/01/2016      PT End of Session - 10/01/16 1440    Visit Number 10  did not treat due to medical issues   Number of Visits 18   Date for PT Re-Evaluation 10/26/16   Authorization Type Medicare G-Code every 10th visit   Activity Tolerance Treatment limited secondary to medical complications (Comment)      Past Medical History:  Diagnosis Date  . Diabetes mellitus without complication (HCC)    Type 2  . Hyperlipidemia   . Hypertension   . Thyroid disease    Hypothyroid    Past Surgical History:  Procedure Laterality Date  . ABDOMINAL HYSTERECTOMY    . PACEMAKER INSERTION      There were no vitals filed for this visit.      Subjective Assessment - 10/01/16 1408    Subjective Patient reports the dizziness have been worse since yesterday morning (09/30/2016). Patient reports she slowly "lowered" to the ground in the bathroom this morning due to dizziness. Patient's daughter-in-law reports she has an appointment with MD on Wednesday (10/03/2016). Patient's daughter-in-law reports that patient's BP was 188/107 this morning. Patient's daughter-in-law reports patient's BP has been running in the 150's/90's lately. Patient reports nausea, but no vomiting this morning due to dizziness.    Patient is accompained by: Family member  daughter-in-law   Limitations Sitting   Patient Stated Goals to get rid of headache & dizziness and be back to normal.    Currently in Pain? No/denies   Pain Onset More than a month ago                Vestibular Assessment - 10/01/16 1400      Orthostatics   BP supine (x 5 minutes) 175/108   HR supine (x 5 minutes) 79   BP sitting 182/99   HR sitting 80   BP standing (after 1 minute) 171/109   HR standing (after 1 minute) 81   BP standing (after 3 minutes) 168/100   HR standing (after 3 minutes) 83                           PT Short Term Goals - 09/27/16 1524      PT SHORT TERM GOAL #1   Title Patient demonstrates initial HEP.  (Target Date: 09/28/2016)   Status Achieved     PT SHORT TERM GOAL #2   Title Berg Balance Test >/= 45/56 to indicate lower fall risk.  (Target Date: 09/28/2016)   Baseline 51/56   Status Achieved     PT SHORT TERM GOAL #3   Title Cognitive Timed Up-Go with cane <24sec.    Baseline 23 seconds with naming task   Status Achieved     PT SHORT TERM GOAL #4   Title Pt will report 1/10 dizziness with rolling and supine<>sit    Baseline 1/10   Status Achieved     PT SHORT TERM GOAL #5   Title Patient ambulates with cane scanning environment without balance loss.   (Target Date: 09/28/2016)   Baseline ambulates without cane with head turns without LOB but with slowed gait velocity   Status Achieved  PT Long Term Goals - 09/27/16 2123      PT LONG TERM GOAL #1   Title Patient demonstrates understanding of ongoing fitness plan / HEP.  (Target Date: 10/26/2016)   Time 2   Period Months   Status On-going     PT LONG TERM GOAL #2   Title Patient has negative testing (0/10) for positional vertigo / dizziness during bed mobility (rolling and supine <> sit) and when bending down to retrieve items from low surfaces/floor.   (Target Date: 10/26/2016)   Time 2   Period Months   Status Revised     PT LONG TERM GOAL #3   Title Berg Balance > or = to 52/56 to indicate lower fall risk.  (Target Date: 10/26/2016)   Time 2   Period Months   Status Revised     PT LONG TERM GOAL #4   Title Cognitive TUG <20 sec to indicate lower fall risk during dual tasking.   (Target Date: 10/26/2016)   Time 2   Period Months   Status Revised     PT LONG TERM GOAL #5    Title Patient ambulates 1000' over uneven pavement/curb and negotiates 3 sets of 12 stairs with rail at MOD I  (Target Date: 10/26/2016)   Time 2   Period Months   Status Revised     PT LONG TERM GOAL #6   Title Pt will ambulate on indoor surfaces x 250' with head turns and changes in direction carrying shopping bags in bilat UE at MOD I level   Time 2   Period Months   Status Revised     PT LONG TERM GOAL #7   Title Complete further vestibular testing & set LTG.  (Target Date: 10/26/2016)   Baseline See LTG #2   Time --   Period --   Status Achieved               Plan - 10/01/16 1444    Clinical Impression Statement Patient presented to PT today with increased dizziness over past two days, but PT was unable to treat due to elevated BP. PT noted R beating nystagmus lasting > 1 minute, and patient was symptomatic when patient transferred sit to supine for BP reading. PT educated patient on monitoring BPs, and logging them to provide more information at next appointment with MD on Wednesday (10/03/2016).    Rehab Potential Good   PT Frequency 2x / week   PT Duration Other (comment)   PT Treatment/Interventions ADLs/Self Care Home Management;DME Instruction;Gait training;Stair training;Functional mobility training;Therapeutic activities;Therapeutic exercise;Balance training;Neuromuscular re-education;Patient/family education;Vestibular;Canalith Repostioning   PT Next Visit Plan continued with high level balance and vestibular training-especially bending and reaching low for items, gait while carrying shopping bags-head turns/dual task, stairs-pt has to ascend 3 flights in [facility]!  Canals cleared on 6/1-reassess if pt symptoms return   Consulted and Agree with Plan of Care Patient;Family member/caregiver  daughter-in-law   Family Member Consulted son      Patient will benefit from skilled therapeutic intervention in order to improve the following deficits and impairments:  Abnormal gait,  Decreased activity tolerance, Decreased balance, Cardiopulmonary status limiting activity, Decreased endurance, Decreased mobility, Decreased strength, Dizziness  Visit Diagnosis: Dizziness and giddiness  Muscle weakness (generalized)  Unsteadiness on feet  Other abnormalities of gait and mobility  History of falling  BPPV (benign paroxysmal positional vertigo), right     Problem List There are no active problems to display for this patient.  [place] Kamila Broda, SPT  10/01/2016, 2:48 PM  [facility] [address] [place], [place], [address] Phone: [phone]   Fax:  [phone]  Name: Kerry Carlson MRN: [id number] Date of Birth: 01/08/1926

## 2016-10-03 DIAGNOSIS — Z6825 Body mass index (BMI) 25.0-25.9, adult: Secondary | ICD-10-CM | POA: Diagnosis not present

## 2016-10-03 DIAGNOSIS — Z7901 Long term (current) use of anticoagulants: Secondary | ICD-10-CM | POA: Diagnosis not present

## 2016-10-03 DIAGNOSIS — I129 Hypertensive chronic kidney disease with stage 1 through stage 4 chronic kidney disease, or unspecified chronic kidney disease: Secondary | ICD-10-CM | POA: Diagnosis not present

## 2016-10-03 DIAGNOSIS — E1129 Type 2 diabetes mellitus with other diabetic kidney complication: Secondary | ICD-10-CM | POA: Diagnosis not present

## 2016-10-03 DIAGNOSIS — N183 Chronic kidney disease, stage 3 (moderate): Secondary | ICD-10-CM | POA: Diagnosis not present

## 2016-10-03 DIAGNOSIS — E784 Other hyperlipidemia: Secondary | ICD-10-CM | POA: Diagnosis not present

## 2016-10-03 DIAGNOSIS — I480 Paroxysmal atrial fibrillation: Secondary | ICD-10-CM | POA: Diagnosis not present

## 2016-10-03 DIAGNOSIS — Z950 Presence of cardiac pacemaker: Secondary | ICD-10-CM | POA: Diagnosis not present

## 2016-10-03 DIAGNOSIS — I6789 Other cerebrovascular disease: Secondary | ICD-10-CM | POA: Diagnosis not present

## 2016-10-03 DIAGNOSIS — M1990 Unspecified osteoarthritis, unspecified site: Secondary | ICD-10-CM | POA: Diagnosis not present

## 2016-10-03 DIAGNOSIS — E038 Other specified hypothyroidism: Secondary | ICD-10-CM | POA: Diagnosis not present

## 2016-10-04 ENCOUNTER — Ambulatory Visit: Payer: Medicare Other | Admitting: Physical Therapy

## 2016-10-04 ENCOUNTER — Encounter: Payer: Self-pay | Admitting: Physical Therapy

## 2016-10-04 VITALS — BP 160/95 | HR 78

## 2016-10-04 DIAGNOSIS — Z9181 History of falling: Secondary | ICD-10-CM

## 2016-10-04 DIAGNOSIS — R42 Dizziness and giddiness: Secondary | ICD-10-CM

## 2016-10-04 DIAGNOSIS — M6281 Muscle weakness (generalized): Secondary | ICD-10-CM

## 2016-10-04 DIAGNOSIS — H8111 Benign paroxysmal vertigo, right ear: Secondary | ICD-10-CM | POA: Diagnosis not present

## 2016-10-04 DIAGNOSIS — R2689 Other abnormalities of gait and mobility: Secondary | ICD-10-CM | POA: Diagnosis not present

## 2016-10-04 DIAGNOSIS — R2681 Unsteadiness on feet: Secondary | ICD-10-CM | POA: Diagnosis not present

## 2016-10-04 NOTE — Therapy (Addendum)
[facility] [address] [place], [place], [address] Phone: [phone]   Fax:  [phone]  Physical Therapy Treatment  Patient Details  Name: Kerry Carlson MRN: [id number] Date of Birth: 11/07/1925 Referring Provider: Guerry Bruinichard Tisovec, MD  Encounter Date: 10/04/2016      PT End of Session - 10/04/16 1544    Visit Number 11   Number of Visits 18   Date for PT Re-Evaluation 10/26/16   Authorization Type Medicare G-Code every 10th visit   PT Start Time 1445   PT Stop Time 1535   PT Time Calculation (min) 50 min   Activity Tolerance Treatment limited secondary to medical complications (Comment)  Patient limited by high BP    Behavior [facility] Therapy [facility] for tasks assessed/performed      Past Medical History:  Diagnosis Date  . Diabetes mellitus without complication (HCC)    Type 2  . Hyperlipidemia   . Hypertension   . Thyroid disease    Hypothyroid    Past Surgical History:  Procedure Laterality Date  . ABDOMINAL HYSTERECTOMY    . PACEMAKER INSERTION      Vitals:   10/04/16 1453 10/04/16 1519 10/04/16 1530  BP: (!) 160/100 (!) 160/95 (!) 160/95  Pulse: 78    SpO2: 98%          Subjective Assessment - 10/04/16 1449    Subjective Patient and patient's son reported the MD altered the patient's BP medication at yesterday's appointment. Patient's first dose was approximately 20 hours ago. Patient's son reports her BP was recorded at 171/101 yesterday prior to beginning new BP medication. Patient denies any falls since last visit. Patient reports her dizziness is severe in the morning, but becomes less throughout the day.   Patient is accompained by: Family member  son   Limitations Sitting   Patient Stated Goals to get rid of headache & dizziness and be back to normal.    Currently in Pain? No/denies   Pain Onset More than a month ago                Vestibular Assessment - 10/04/16 1445       Horizontal Canal Right   Horizontal Canal Right Duration approximately 45 seconds    Horizontal Canal Right Symptoms Geotrophic     Horizontal Canal Left   Horizontal Canal Left Duration <1 minute    Horizontal Canal Left Symptoms Geotrophic;Other (comment)  rotarty component      Orthostatics   BP supine (x 5 minutes) 160/100            [facility] Adult PT Treatment/Exercise - 10/04/16 1636      Self-Care   Self-Care Other Self-Care Comments   Other Self-Care Comments  Discussed with pt and son PT recommendation for temporary use of hospital bed in the home.  Due to pt tendency to roll and turn multiple directions in a large flat bed causing otoconia to return to horizontal canal, recommending use of hospital bed with rails and HOB elevated to maintain repositioning of otoconia in vestibule following canalith repositioning manuevers.  Son has attempted to elevate HOB at home but without successful remission of BPPV.           Vestibular Treatment/Exercise - 10/04/16 1445      Vestibular Treatment/Exercise   Vestibular Treatment Provided Canalith Repositioning   Canalith Repositioning Canal Roll Right     Canal Roll Right   Number of Reps  1  Overall Response  Symptoms Worsened   Response Details  patient reports disequilibrium and increased dizziness following one repetition                PT Education - 10/04/16 1633    Education provided Yes   Education Details recommended use of hospital bed to decrease patient's rolling in sleep    Person(s) Educated Patient;Child(ren)  son   Methods Explanation   Comprehension Verbalized understanding          PT Short Term Goals - 09/27/16 1524      PT SHORT TERM GOAL #1   Title Patient demonstrates initial HEP.  (Target Date: 09/28/2016)   Status Achieved     PT SHORT TERM GOAL #2   Title Berg Balance Test >/= 45/56 to indicate lower fall risk.  (Target Date: 09/28/2016)   Baseline 51/56   Status Achieved     PT  SHORT TERM GOAL #3   Title Cognitive Timed Up-Go with cane <24sec.    Baseline 23 seconds with naming task   Status Achieved     PT SHORT TERM GOAL #4   Title Pt will report 1/10 dizziness with rolling and supine<>sit    Baseline 1/10   Status Achieved     PT SHORT TERM GOAL #5   Title Patient ambulates with cane scanning environment without balance loss.   (Target Date: 09/28/2016)   Baseline ambulates without cane with head turns without LOB but with slowed gait velocity   Status Achieved           PT Long Term Goals - 09/27/16 2123      PT LONG TERM GOAL #1   Title Patient demonstrates understanding of ongoing fitness plan / HEP.  (Target Date: 10/26/2016)   Time 2   Period Months   Status On-going     PT LONG TERM GOAL #2   Title Patient has negative testing (0/10) for positional vertigo / dizziness during bed mobility (rolling and supine <> sit) and when bending down to retrieve items from low surfaces/floor.   (Target Date: 10/26/2016)   Time 2   Period Months   Status Revised     PT LONG TERM GOAL #3   Title Berg Balance > or = to 52/56 to indicate lower fall risk.  (Target Date: 10/26/2016)   Time 2   Period Months   Status Revised     PT LONG TERM GOAL #4   Title Cognitive TUG <20 sec to indicate lower fall risk during dual tasking.   (Target Date: 10/26/2016)   Time 2   Period Months   Status Revised     PT LONG TERM GOAL #5   Title Patient ambulates 1000' over uneven pavement/curb and negotiates 3 sets of 12 stairs with rail at MOD I  (Target Date: 10/26/2016)   Time 2   Period Months   Status Revised     PT LONG TERM GOAL #6   Title Pt will ambulate on indoor surfaces x 250' with head turns and changes in direction carrying shopping bags in bilat UE at MOD I level   Time 2   Period Months   Status Revised     PT LONG TERM GOAL #7   Title Complete further vestibular testing & set LTG.  (Target Date: 10/26/2016)   Baseline See LTG #2   Time --   Period --    Status Achieved  Plan - 10/04/16 1551    Clinical Impression Statement Today's skilled PT session assessed patient's BP, which was 160/100 from both a seated and supine position. Patient started a new BP medication < 1 day ago. PT monitored patient's BP throughout session, and it stayed at 160/95 throughout assessment and treatment of positional vertigo. PT treated R horizontal canalithiasis based on findings from horizontal roll testing, which was performed bilaterally. Due to patient's elevated BP, PT did not reassess horizontal canals following treatment as patient reported an increase in disequilibrium and dizziness following treatment. Patient appears to have multi-canal involvement based on PT's findings. PT will continue to assess and treat as indicated while monitoring BP values as patient transitions onto new BP medication.    Rehab Potential Good   PT Frequency 2x / week   PT Duration Other (comment)   PT Treatment/Interventions ADLs/Self Care Home Management;DME Instruction;Gait training;Stair training;Functional mobility training;Therapeutic activities;Therapeutic exercise;Balance training;Neuromuscular re-education;Patient/family education;Vestibular;Canalith Repostioning   PT Next Visit Plan monitor BP; reassess canals and treat as indicated    Consulted and Agree with Plan of Care Patient;Family member/caregiver  daughter-in-law   Family Member Consulted son      Patient will benefit from skilled therapeutic intervention in order to improve the following deficits and impairments:  Abnormal gait, Decreased activity tolerance, Decreased balance, Cardiopulmonary status limiting activity, Decreased endurance, Decreased mobility, Decreased strength, Dizziness  Visit Diagnosis: Dizziness and giddiness  Muscle weakness (generalized)  Unsteadiness on feet  Other abnormalities of gait and mobility  History of falling     Problem List There are no active  problems to display for this patient.   [place], SPT  10/04/2016, 4:40 PM  [facility] [address] [place], [place], [address] Phone: [phone]   Fax:  [phone]  Name: Kerry Carlson MRN: [id number] Date of Birth: [birth year]

## 2016-10-08 ENCOUNTER — Ambulatory Visit: Payer: Medicare Other | Admitting: Physical Therapy

## 2016-10-08 ENCOUNTER — Encounter: Payer: Self-pay | Admitting: Physical Therapy

## 2016-10-08 DIAGNOSIS — M6281 Muscle weakness (generalized): Secondary | ICD-10-CM

## 2016-10-08 DIAGNOSIS — R2689 Other abnormalities of gait and mobility: Secondary | ICD-10-CM | POA: Diagnosis not present

## 2016-10-08 DIAGNOSIS — R2681 Unsteadiness on feet: Secondary | ICD-10-CM | POA: Diagnosis not present

## 2016-10-08 DIAGNOSIS — R42 Dizziness and giddiness: Secondary | ICD-10-CM | POA: Diagnosis not present

## 2016-10-08 DIAGNOSIS — H8111 Benign paroxysmal vertigo, right ear: Secondary | ICD-10-CM

## 2016-10-08 DIAGNOSIS — Z9181 History of falling: Secondary | ICD-10-CM | POA: Diagnosis not present

## 2016-10-08 NOTE — Therapy (Signed)
[facility] [address] [place], [place], [address] Phone: [phone]   Fax:  [phone]  Physical Therapy Treatment  Patient Details  Name: Kerry Carlson MRN: [id number] Date of Birth: 09-27-1925 Referring Provider: Guerry Bruin, MD  Encounter Date: 10/08/2016      PT End of Session - 10/08/16 1643    Visit Number 12   Number of Visits 18   Date for PT Re-Evaluation 10/26/16   Authorization Type Medicare G-Code every 10th visit   PT Start Time 1451   PT Stop Time 1537   PT Time Calculation (min) 46 min   Activity Tolerance Patient tolerated treatment well  Patient limited by high BP    Behavior During Therapy  [facility] for tasks assessed/performed      Past Medical History:  Diagnosis Date  . Diabetes mellitus without complication (HCC)    Type 2  . Hyperlipidemia   . Hypertension   . Thyroid disease    Hypothyroid    Past Surgical History:  Procedure Laterality Date  . ABDOMINAL HYSTERECTOMY    . PACEMAKER INSERTION      There were no vitals filed for this visit.      Subjective Assessment - 10/08/16 1457    Subjective Since Friday pt has been very dizzy and weak; has been sleeping in recliner-MD has not ordered hospital bed yet.  Today was the first morning she has not woken up dizzy.   Patient is accompained by: Family member  son   Limitations Sitting   Patient Stated Goals to get rid of headache & dizziness and be back to normal.    Currently in Pain? No/denies   Pain Onset More than a month ago                Vestibular Assessment - 10/08/16 1459      Positional Testing   Horizontal Canal Testing Horizontal Canal Right;Horizontal Canal Left     Horizontal Canal Right   Horizontal Canal Right Duration 0   Horizontal Canal Right Symptoms --  no nystagmus until treating L horizontal canal     Horizontal Canal Left   Horizontal Canal Left Duration <1 minute   Horizontal  Canal Left Symptoms Geotrophic                  Vestibular Treatment/Exercise - 10/08/16 1511      Vestibular Treatment/Exercise   Vestibular Treatment Provided Canalith Repositioning   Canalith Repositioning Canal Roll Right;Canal Roll Left     Canal Roll Left   Number of Reps  2   Overall Response  Improved Symptoms   Response Details  During re-test-no nystagmus or vertigo with R Roll test first and second repetition            Balance Exercises - 10/08/16 1534      Balance Exercises: Standing   Gait with Head Turns Forward;Retro;Upper extremity support;4 reps             PT Short Term Goals - 09/27/16 1524      PT SHORT TERM GOAL #1   Title Patient demonstrates initial HEP.  (Target Date: 09/28/2016)   Status Achieved     PT SHORT TERM GOAL #2   Title Berg Balance Test >/= 45/56 to indicate lower fall risk.  (Target Date: 09/28/2016)   Baseline 51/56   Status Achieved     PT SHORT TERM GOAL #3   Title Cognitive Timed Up-Go with cane <24sec.  Baseline 23 seconds with naming task   Status Achieved     PT SHORT TERM GOAL #4   Title Pt will report 1/10 dizziness with rolling and supine<>sit    Baseline 1/10   Status Achieved     PT SHORT TERM GOAL #5   Title Patient ambulates with cane scanning environment without balance loss.   (Target Date: 09/28/2016)   Baseline ambulates without cane with head turns without LOB but with slowed gait velocity   Status Achieved           PT Long Term Goals - 09/27/16 2123      PT LONG TERM GOAL #1   Title Patient demonstrates understanding of ongoing fitness plan / HEP.  (Target Date: 10/26/2016)   Time 2   Period Months   Status On-going     PT LONG TERM GOAL #2   Title Patient has negative testing (0/10) for positional vertigo / dizziness during bed mobility (rolling and supine <> sit) and when bending down to retrieve items from low surfaces/floor.   (Target Date: 10/26/2016)   Time 2   Period Months    Status Revised     PT LONG TERM GOAL #3   Title Berg Balance > or = to 52/56 to indicate lower fall risk.  (Target Date: 10/26/2016)   Time 2   Period Months   Status Revised     PT LONG TERM GOAL #4   Title Cognitive TUG <20 sec to indicate lower fall risk during dual tasking.   (Target Date: 10/26/2016)   Time 2   Period Months   Status Revised     PT LONG TERM GOAL #5   Title Patient ambulates 1000' over uneven pavement/curb and negotiates 3 sets of 12 stairs with rail at MOD I  (Target Date: 10/26/2016)   Time 2   Period Months   Status Revised     PT LONG TERM GOAL #6   Title Pt will ambulate on indoor surfaces x 250' with head turns and changes in direction carrying shopping bags in bilat UE at MOD I level   Time 2   Period Months   Status Revised     PT LONG TERM GOAL #7   Title Complete further vestibular testing & set LTG.  (Target Date: 10/26/2016)   Baseline See LTG #2   Time --   Period --   Status Achieved               Plan - 10/08/16 1643    Clinical Impression Statement Continued assessment of BPPV; pt R horizontal canal assessed first with no nystagmus or symptoms of vertigo.  Pt continued to report vertigo and geotropic nystagmus noted with L roll test; treated L horizontal canal canalithiasis x 2 reps with improvement in symptoms and improved tolerance of treatment positions.  Progressed to gait training with head turns with pt veering to L and R with horizontal head turns; will continue to address and progress as pt tolerates.  Will continue to advocate for pt to receive hospital bed to maintain progress with repositioning of otoconia.   Rehab Potential Good   PT Frequency 2x / week   PT Duration Other (comment)   PT Treatment/Interventions ADLs/Self Care Home Management;DME Instruction;Gait training;Stair training;Functional mobility training;Therapeutic activities;Therapeutic exercise;Balance training;Neuromuscular re-education;Patient/family  education;Vestibular;Canalith Repostioning   PT Next Visit Plan monitor BP; reassess canals and treat as indicated; if canals cleared focus on standing balance, gait and increased use of vestibular  system with head turns, decreasing BOS, compliant surfaces, safety on stairs.  Did they get hospital bed?    Consulted and Agree with Plan of Care Patient;Family member/caregiver  daughter-in-law   Family Member Consulted son and daughter in law      Patient will benefit from skilled therapeutic intervention in order to improve the following deficits and impairments:  Abnormal gait, Decreased activity tolerance, Decreased balance, Cardiopulmonary status limiting activity, Decreased endurance, Decreased mobility, Decreased strength, Dizziness  Visit Diagnosis: Dizziness and giddiness  Muscle weakness (generalized)  Unsteadiness on feet  Other abnormalities of gait and mobility  History of falling  BPPV (benign paroxysmal positional vertigo), right     Problem List There are no active problems to display for this patient.  Edman Circle, PT, [name] 10/08/16    4:48 PM    [facility] [address] [place], [place], [address] Phone: [phone]   Fax:  [phone]  Name: Kerry Carlson MRN: [id number] Date of Birth: 1925-11-20

## 2016-10-09 DIAGNOSIS — I10 Essential (primary) hypertension: Secondary | ICD-10-CM | POA: Diagnosis not present

## 2016-10-11 DIAGNOSIS — I495 Sick sinus syndrome: Secondary | ICD-10-CM | POA: Diagnosis not present

## 2016-10-11 DIAGNOSIS — I482 Chronic atrial fibrillation: Secondary | ICD-10-CM | POA: Diagnosis not present

## 2016-10-11 DIAGNOSIS — Z950 Presence of cardiac pacemaker: Secondary | ICD-10-CM | POA: Diagnosis not present

## 2016-10-11 DIAGNOSIS — I10 Essential (primary) hypertension: Secondary | ICD-10-CM | POA: Diagnosis not present

## 2016-10-11 HISTORY — PX: PACEMAKER INSERTION: SHX728

## 2016-10-12 ENCOUNTER — Encounter: Payer: Self-pay | Admitting: Physical Therapy

## 2016-10-12 ENCOUNTER — Ambulatory Visit: Payer: Medicare Other | Admitting: Physical Therapy

## 2016-10-12 VITALS — BP 164/88 | HR 81

## 2016-10-12 DIAGNOSIS — M6281 Muscle weakness (generalized): Secondary | ICD-10-CM | POA: Diagnosis not present

## 2016-10-12 DIAGNOSIS — H8111 Benign paroxysmal vertigo, right ear: Secondary | ICD-10-CM

## 2016-10-12 DIAGNOSIS — R2681 Unsteadiness on feet: Secondary | ICD-10-CM | POA: Diagnosis not present

## 2016-10-12 DIAGNOSIS — Z9181 History of falling: Secondary | ICD-10-CM | POA: Diagnosis not present

## 2016-10-12 DIAGNOSIS — R42 Dizziness and giddiness: Secondary | ICD-10-CM

## 2016-10-12 DIAGNOSIS — R2689 Other abnormalities of gait and mobility: Secondary | ICD-10-CM | POA: Diagnosis not present

## 2016-10-12 NOTE — Therapy (Signed)
[facility] [address] [place], [place], [address] Phone: [phone]   Fax:  [phone]  Physical Therapy Treatment  Patient Details  Name: Kerry Carlson MRN: [id number] Date of Birth: 08/05/1925 Referring Provider: Guerry Bruin, MD  Encounter Date: 10/12/2016      PT End of Session - 10/12/16 1545    Visit Number 13   Number of Visits 18   Date for PT Re-Evaluation 10/26/16   Authorization Type Medicare G-Code every 10th visit   PT Start Time 1448   PT Stop Time 1530   PT Time Calculation (min) 42 min   Equipment Utilized During Treatment Gait belt   Activity Tolerance Patient tolerated treatment well  Patient limited by high BP    Behavior During Therapy [facility] for tasks assessed/performed      Past Medical History:  Diagnosis Date  . Diabetes mellitus without complication (HCC)    Type 2  . Hyperlipidemia   . Hypertension   . Thyroid disease    Hypothyroid    Past Surgical History:  Procedure Laterality Date  . ABDOMINAL HYSTERECTOMY    . PACEMAKER INSERTION      Vitals:   10/12/16 1457  BP: (!) 164/88  Pulse: 81        Subjective Assessment - 10/12/16 1452    Subjective Patient reports she got her hospital bed yesterday (10/11/2016), and slept in the hospital bed last night with the head of the bed elevated. She reports she woke this morning, and was less dizzy this morning that she has been in previous mornings. She reports she is dizzy when she is up/moving througout the day. Denies any falls. Patient reports she had cardiology appt yesterday, and they altered BP medication.   Patient is accompained by: Family member  son   Limitations Sitting   Patient Stated Goals to get rid of headache & dizziness and be back to normal.    Currently in Pain? No/denies   Pain Onset More than a month ago                Vestibular Assessment - 10/12/16 1445      Positional Testing   Horizontal Canal Testing Horizontal Canal Right;Horizontal Canal Left     Horizontal Canal Right   Horizontal Canal Right Symptoms Normal     Horizontal Canal Left   Horizontal Canal Left Symptoms Normal     PT noted mild L beating nystagmus when patient transferred from sitting edge of mat to supine with associated reports of mild dizziness. PT assessed bilateral horizontal canals twice (once with cervical rotation only, second time with patient's entire body rolling into sidelying position). All testing was negative for nystagmus. Following assessment of horizontal canals, PT no longer noted any L beating nystagmus in any position (even when resting supine between R and L horizontal roll testing). Any reports of dizziness from patient were brief (<15s) relative to previous canal assessments for BPPV.             [facility] Adult PT Treatment/Exercise - 10/12/16 1445      Ambulation/Gait   Ambulation/Gait Yes   Ambulation/Gait Assistance 5: Supervision   Ambulation/Gait Assistance Details Requires cueing for posture    Assistive device Straight cane   Gait Pattern Step-through pattern;Decreased step length - right;Decreased step length - left;Decreased stride length;Shuffle;Decreased trunk rotation;Poor foot clearance - left;Poor foot clearance - right   Ambulation Surface Level;Indoor     Neuro Re-ed  Neuro Re-ed Details  In hallway performed multiple laps while performing head turns/nods, retro walking, and side stepping. Required demo, cueing for technique, and min guard with intermittent minA-modA due to misstep.                PT Education - 10/12/16 1544    Education provided Yes   Education Details sleeping position in hospital bed    Person(s) Educated Patient   Methods Explanation;Handout   Comprehension Verbalized understanding          PT Short Term Goals - 09/27/16 1524      PT SHORT TERM GOAL #1   Title Patient demonstrates initial HEP.  (Target Date:  09/28/2016)   Status Achieved     PT SHORT TERM GOAL #2   Title Berg Balance Test >/= 45/56 to indicate lower fall risk.  (Target Date: 09/28/2016)   Baseline 51/56   Status Achieved     PT SHORT TERM GOAL #3   Title Cognitive Timed Up-Go with cane <24sec.    Baseline 23 seconds with naming task   Status Achieved     PT SHORT TERM GOAL #4   Title Pt will report 1/10 dizziness with rolling and supine<>sit    Baseline 1/10   Status Achieved     PT SHORT TERM GOAL #5   Title Patient ambulates with cane scanning environment without balance loss.   (Target Date: 09/28/2016)   Baseline ambulates without cane with head turns without LOB but with slowed gait velocity   Status Achieved           PT Long Term Goals - 09/27/16 2123      PT LONG TERM GOAL #1   Title Patient demonstrates understanding of ongoing fitness plan / HEP.  (Target Date: 10/26/2016)   Time 2   Period Months   Status On-going     PT LONG TERM GOAL #2   Title Patient has negative testing (0/10) for positional vertigo / dizziness during bed mobility (rolling and supine <> sit) and when bending down to retrieve items from low surfaces/floor.   (Target Date: 10/26/2016)   Time 2   Period Months   Status Revised     PT LONG TERM GOAL #3   Title Berg Balance > or = to 52/56 to indicate lower fall risk.  (Target Date: 10/26/2016)   Time 2   Period Months   Status Revised     PT LONG TERM GOAL #4   Title Cognitive TUG <20 sec to indicate lower fall risk during dual tasking.   (Target Date: 10/26/2016)   Time 2   Period Months   Status Revised     PT LONG TERM GOAL #5   Title Patient ambulates 1000' over uneven pavement/curb and negotiates 3 sets of 12 stairs with rail at MOD I  (Target Date: 10/26/2016)   Time 2   Period Months   Status Revised     PT LONG TERM GOAL #6   Title Pt will ambulate on indoor surfaces x 250' with head turns and changes in direction carrying shopping bags in bilat UE at MOD I level   Time 2    Period Months   Status Revised     PT LONG TERM GOAL #7   Title Complete further vestibular testing & set LTG.  (Target Date: 10/26/2016)   Baseline See LTG #2   Time --   Period --   Status Achieved  Plan - 10/12/16 1554    Clinical Impression Statement Today' skilled PT session focused on assessing patient's horizontal canals for BPPV. PT noted mild L beating nystagmus when patient transferred sit to supine with associated reports of mild dizziness that passed in <15 seconds. PT assessed both horizontal canals by having the patient roll to a sidelying position. After checking bilateral horizontal canals, PT no longer noted any L beating nystagmus when patient was lying in supine position. Patient reported intermittent mild dizziness as PT assessed horizontal canals lasting <15s. It appears that patient's semicircular canals are clear based on today's presentation. PT then progressed to dynamic balance activities. Patient is making progress, and will benefit from continued skilled PT to address functional mobility deficits.    Rehab Potential Good   PT Frequency 2x / week   PT Duration Other (comment)   PT Treatment/Interventions ADLs/Self Care Home Management;DME Instruction;Gait training;Stair training;Functional mobility training;Therapeutic activities;Therapeutic exercise;Balance training;Neuromuscular re-education;Patient/family education;Vestibular;Canalith Repostioning   PT Next Visit Plan monitor BP; reassess canals and treat as indicated; progress balance activities if canals stay cleared with focus on stairs, dual tasksing with head turns, ambulation while holding/lifting bags (shopping), compliant surfaces    Consulted and Agree with Plan of Care Patient  daughter-in-law      Patient will benefit from skilled therapeutic intervention in order to improve the following deficits and impairments:  Abnormal gait, Decreased activity tolerance, Decreased balance,  Cardiopulmonary status limiting activity, Decreased endurance, Decreased mobility, Decreased strength, Dizziness  Visit Diagnosis: Dizziness and giddiness  Unsteadiness on feet  BPPV (benign paroxysmal positional vertigo), right  History of falling     Problem List There are no active problems to display for this patient.   [place], SPT  10/12/2016, 3:57 PM  [facility] [address] [place], [place], [address] Phone: [phone]   Fax:  [phone]  Name: BRENLEIGH COLLET MRN: [id number] Date of Birth: 1925/12/23

## 2016-10-12 NOTE — Patient Instructions (Signed)
FOR HOSPITAL BED:  -SLEEP WITH HEAD OF BED AT 20-30 DEG ELEVATED  -FOR 2 NIGHTS READ AND SLEEP ON RIGHT SIDE WITH PILLOWS BEHIND BACK TO PREVENT ROLLING TO LEFT SIDE (CAN TRY SLEEPING ON RIGHT SIDE AFTER Sunday)

## 2016-10-15 ENCOUNTER — Ambulatory Visit: Payer: Medicare Other | Admitting: Physical Therapy

## 2016-10-15 ENCOUNTER — Encounter: Payer: Self-pay | Admitting: Physical Therapy

## 2016-10-15 DIAGNOSIS — Z9181 History of falling: Secondary | ICD-10-CM | POA: Diagnosis not present

## 2016-10-15 DIAGNOSIS — R2681 Unsteadiness on feet: Secondary | ICD-10-CM | POA: Diagnosis not present

## 2016-10-15 DIAGNOSIS — R42 Dizziness and giddiness: Secondary | ICD-10-CM

## 2016-10-15 DIAGNOSIS — M6281 Muscle weakness (generalized): Secondary | ICD-10-CM

## 2016-10-15 DIAGNOSIS — H8111 Benign paroxysmal vertigo, right ear: Secondary | ICD-10-CM | POA: Diagnosis not present

## 2016-10-15 DIAGNOSIS — R2689 Other abnormalities of gait and mobility: Secondary | ICD-10-CM

## 2016-10-15 NOTE — Therapy (Signed)
[facility] [address] [place], [place], [address] Phone: [phone]   Fax:  [phone]  Physical Therapy Treatment  Patient Details  Name: Kerry Carlson MRN: [id number] Date of Birth: 1925-08-28 Referring Provider: Guerry Bruin, MD  Encounter Date: 10/15/2016      PT End of Session - 10/15/16 1647    Visit Number 14   Number of Visits 18   Date for PT Re-Evaluation 10/26/16   Authorization Type Medicare G-Code every 10th visit   PT Start Time 1450   PT Stop Time 1534   PT Time Calculation (min) 44 min   Activity Tolerance Patient tolerated treatment well  Patient limited by high BP    Behavior During Therapy [facility] for tasks assessed/performed      Past Medical History:  Diagnosis Date  . Diabetes mellitus without complication (HCC)    Type 2  . Hyperlipidemia   . Hypertension   . Thyroid disease    Hypothyroid    Past Surgical History:  Procedure Laterality Date  . ABDOMINAL HYSTERECTOMY    . PACEMAKER INSERTION      There were no vitals filed for this visit.      Subjective Assessment - 10/15/16 1454    Subjective Pt continues to sleep in hospital bed on R side with pillows behind her back; at times she wakes up on her back.  Still reporting mild dizziness with various head turns/movements.   Patient is accompained by: Family member  grandson   Limitations Sitting   Patient Stated Goals to get rid of headache & dizziness and be back to normal.    Currently in Pain? No/denies                Vestibular Assessment - 10/15/16 1456      Positional Testing   Dix-Hallpike Dix-Hallpike Right;Dix-Hallpike Left   Horizontal Canal Testing Horizontal Canal Right;Horizontal Canal Left     Dix-Hallpike Right   Dix-Hallpike Right Duration 3-4   Dix-Hallpike Right Symptoms Right nystagmus     Dix-Hallpike Left   Dix-Hallpike Left Duration 0   Dix-Hallpike Left Symptoms No nystagmus      Horizontal Canal Right   Horizontal Canal Right Duration 0; 5-10 seconds during rolling   Horizontal Canal Right Symptoms Other (comment);Geotrophic;Normal  head turned to R-normal; roll to R geotropic, rotary noted     Horizontal Canal Left   Horizontal Canal Left Duration 0   Horizontal Canal Left Symptoms Normal                  Vestibular Treatment/Exercise - 10/15/16 1535      Vestibular Treatment/Exercise   Habituation Exercises Francee Piccolo Daroff;Horizontal Roll   Gaze Exercises X1 Viewing Horizontal;X1 Viewing Vertical     Austin Miles   Number of Reps  2   Symptom Description  symptoms varied from no dizziness to mild dizziness     Horizontal Roll   Number of Reps  0   Symptom Description  just verbalized sequence to pt and grandson     X1 Viewing Horizontal   Foot Position seated   Reps 2   Comments verbal and visual cues for correct technique; modifications made for home     X1 Viewing Vertical   Foot Position seated   Reps 2   Comments verbal and visual cues for correct technique; modifications made for home               PT Education -  10/15/16 1643    Education provided Yes   Education Details habituation and adaptation exercises   Person(s) Educated Patient;Other (comment)  grandson   Methods Explanation;Demonstration;Handout   Comprehension Need further instruction          PT Short Term Goals - 09/27/16 1524      PT SHORT TERM GOAL #1   Title Patient demonstrates initial HEP.  (Target Date: 09/28/2016)   Status Achieved     PT SHORT TERM GOAL #2   Title Berg Balance Test >/= 45/56 to indicate lower fall risk.  (Target Date: 09/28/2016)   Baseline 51/56   Status Achieved     PT SHORT TERM GOAL #3   Title Cognitive Timed Up-Go with cane <24sec.    Baseline 23 seconds with naming task   Status Achieved     PT SHORT TERM GOAL #4   Title Pt will report 1/10 dizziness with rolling and supine<>sit    Baseline 1/10   Status  Achieved     PT SHORT TERM GOAL #5   Title Patient ambulates with cane scanning environment without balance loss.   (Target Date: 09/28/2016)   Baseline ambulates without cane with head turns without LOB but with slowed gait velocity   Status Achieved           PT Long Term Goals - 09/27/16 2123      PT LONG TERM GOAL #1   Title Patient demonstrates understanding of ongoing fitness plan / HEP.  (Target Date: 10/26/2016)   Time 2   Period Months   Status On-going     PT LONG TERM GOAL #2   Title Patient has negative testing (0/10) for positional vertigo / dizziness during bed mobility (rolling and supine <> sit) and when bending down to retrieve items from low surfaces/floor.   (Target Date: 10/26/2016)   Time 2   Period Months   Status Revised     PT LONG TERM GOAL #3   Title Berg Balance > or = to 52/56 to indicate lower fall risk.  (Target Date: 10/26/2016)   Time 2   Period Months   Status Revised     PT LONG TERM GOAL #4   Title Cognitive TUG <20 sec to indicate lower fall risk during dual tasking.   (Target Date: 10/26/2016)   Time 2   Period Months   Status Revised     PT LONG TERM GOAL #5   Title Patient ambulates 1000' over uneven pavement/curb and negotiates 3 sets of 12 stairs with rail at MOD I  (Target Date: 10/26/2016)   Time 2   Period Months   Status Revised     PT LONG TERM GOAL #6   Title Pt will ambulate on indoor surfaces x 250' with head turns and changes in direction carrying shopping bags in bilat UE at MOD I level   Time 2   Period Months   Status Revised     PT LONG TERM GOAL #7   Title Complete further vestibular testing & set LTG.  (Target Date: 10/26/2016)   Baseline See LTG #2   Time --   Period --   Status Achieved               Plan - 10/15/16 1647    Clinical Impression Statement Due to pt continued reports of dizziness when sitting up from supine performed re-assessment of all canals.  Upon testing all canals clear of nystagmus but  pt had intermittent,  brief episodes of mild vertigo and intermittent R and then L beating nystagmus without rotary component but not consistent during positional testing.  Transitioned to reviewing and restarting habituation exercises with grandson present to observe.  Also reviewed x 1 viewing exercise and provided cues for correct technique.  Will continue to address in order to progress towards LTG.   Rehab Potential Good   PT Frequency 2x / week   PT Duration Other (comment)   PT Treatment/Interventions ADLs/Self Care Home Management;DME Instruction;Gait training;Stair training;Functional mobility training;Therapeutic activities;Therapeutic exercise;Balance training;Neuromuscular re-education;Patient/family education;Vestibular;Canalith Repostioning   PT Next Visit Plan monitor BP; reassess canals and treat as indicated; review habituation exercises and x 1 viewing and check for accuracy!!!  progress balance activities if canals stay cleared with focus on stairs, dual tasking with head turns, ambulation while holding/lifting bags (shopping), compliant surfaces    Consulted and Agree with Plan of Care Patient;Other (Comment)  daughter-in-law   Family Member Consulted grandson      Patient will benefit from skilled therapeutic intervention in order to improve the following deficits and impairments:  Abnormal gait, Decreased activity tolerance, Decreased balance, Cardiopulmonary status limiting activity, Decreased endurance, Decreased mobility, Decreased strength, Dizziness  Visit Diagnosis: Dizziness and giddiness  Unsteadiness on feet  BPPV (benign paroxysmal positional vertigo), right  History of falling  Muscle weakness (generalized)  Other abnormalities of gait and mobility     Problem List There are no active problems to display for this patient.   Edman Circle, PT, [name] 10/15/16    4:55 PM    [facility] [address]  [address] [place], [place], [address] Phone: [phone]   Fax:  [phone]  Name: LUISE YAMAMOTO MRN: [id number] Date of Birth: 1926/01/27

## 2016-10-15 NOTE — Patient Instructions (Addendum)
Pt may now sleep on either R or L side in hospital bed with head of bed elevated to 30 deg   Habituation - Tip Card  1.The goal of habituation training is to assist in decreasing symptoms of vertigo, dizziness, or nausea provoked by specific head and body motions. 2.These exercises may initially increase symptoms; however, be persistent and work through symptoms. With repetition and time, the exercises will assist in reducing or eliminating symptoms. 3.Exercises should be stopped and discussed with the therapist if you experience any of the following: - Sudden change or fluctuation in hearing - New onset of ringing in the ears, or increase in current intensity - Any fluid discharge from the ear - Severe pain in neck or back - Extreme nausea  Copyright  VHI. All rights reserved.   Habituation - Rolling   With pillow under head, start on back. Roll to your right side.  Hold until dizziness stops, plus 20 seconds and then roll to the left side.  Hold until dizziness stops, plus 20 seconds.  Repeat sequence 5 times per session. Do 2 sessions per day.  Copyright  VHI. All rights reserved.   Habituation - Sit to Side-Lying   Sit on edge of bed. Lie down onto the right side and hold until dizziness stops, plus 20 seconds.  Return to sitting and wait until dizziness stops, plus 20 seconds.  Repeat to the left side. Repeat sequence 2-3 times per session. Do 2 sessions per day.   Gaze Stabilization: Sitting    Sit in a chair with back support; tape letter to the back of a door at eye level. Keeping eyes on target on wall ~10 feet away, tilt head down 15-30: 1. Move head side to side for _20_ seconds (like you are saying yes), keep your eyes on the letter 2. Repeat while moving head up and down (like you are saying no) for _20_ seconds, keep your eyes on the letter. Repeat the sequence with performing each head motion 3 times Do __1-2__ sessions per day.

## 2016-10-19 ENCOUNTER — Ambulatory Visit: Payer: Medicare Other | Admitting: Physical Therapy

## 2016-10-19 ENCOUNTER — Encounter: Payer: Self-pay | Admitting: Physical Therapy

## 2016-10-19 DIAGNOSIS — H8111 Benign paroxysmal vertigo, right ear: Secondary | ICD-10-CM | POA: Diagnosis not present

## 2016-10-19 DIAGNOSIS — Z9181 History of falling: Secondary | ICD-10-CM

## 2016-10-19 DIAGNOSIS — R2681 Unsteadiness on feet: Secondary | ICD-10-CM | POA: Diagnosis not present

## 2016-10-19 DIAGNOSIS — R42 Dizziness and giddiness: Secondary | ICD-10-CM | POA: Diagnosis not present

## 2016-10-19 DIAGNOSIS — R2689 Other abnormalities of gait and mobility: Secondary | ICD-10-CM | POA: Diagnosis not present

## 2016-10-19 DIAGNOSIS — M6281 Muscle weakness (generalized): Secondary | ICD-10-CM | POA: Diagnosis not present

## 2016-10-19 NOTE — Therapy (Signed)
[facility] [address] [place], [place], [address] Phone: [phone]   Fax:  [phone]  Physical Therapy Treatment  Patient Details  Name: Kerry Carlson MRN: [id number] Date of Birth: 06-19-1925 Referring Provider: Guerry Bruin, MD  Encounter Date: 10/19/2016      PT End of Session - 10/19/16 2052    Visit Number 15   Number of Visits 18   Date for PT Re-Evaluation 10/26/16   Authorization Type Medicare G-Code every 10th visit   PT Start Time 1450   PT Stop Time 1536   PT Time Calculation (min) 46 min   Activity Tolerance Patient tolerated treatment well  Patient limited by high BP    Behavior During Therapy [facility] for tasks assessed/performed      Past Medical History:  Diagnosis Date  . Diabetes mellitus without complication (HCC)    Type 2  . Hyperlipidemia   . Hypertension   . Thyroid disease    Hypothyroid    Past Surgical History:  Procedure Laterality Date  . ABDOMINAL HYSTERECTOMY    . PACEMAKER INSERTION      There were no vitals filed for this visit.      Subjective Assessment - 10/19/16 1456    Subjective Pt reports having two good nights in a row with no dizziness; reports habituation exercises (rolling) has worked!  No falls   Patient is accompained by: Family member   Limitations Sitting   Patient Stated Goals to get rid of headache & dizziness and be back to normal.    Currently in Pain? No/denies                Vestibular Assessment - 10/19/16 1502      Vestibulo-Occular Reflex   VOR 1 Head Only (x 1 viewing) Intact   Comment HIT: - bilaterally     Positional Sensitivities   Nose to Right Knee Moderate dizziness   Right Knee to Sitting Moderate dizziness   Nose to Left Knee Moderate dizziness   Left Knee to Sitting Moderate dizziness   Head Turning x 5 Lightheadedness   Head Nodding x 5 Lightheadedness   Pivot Right in Standing No dizziness   Pivot Left in  Standing No dizziness     Orthostatics   BP sitting 170/109   HR sitting 71   Orthostatics Comment Assessed seated BP again with manual BP cuff- 170/85                  Vestibular Treatment/Exercise - 10/19/16 1525      Vestibular Treatment/Exercise   Habituation Exercises Seated Vertical Head Turns     Seated Vertical Head Turns   Number of Reps  8   Symptom Description  Reaching down to floor to retrieve and place 8 cones, R and then LUE seated on solid surface/feet on solid surface; seated on solid surface/feet on compliant surface; seated on compliant surface reaching low <> high            Balance Exercises - 10/19/16 1531      Balance Exercises: Standing   SLS Eyes open;Foam/compliant surface;Upper extremity support 1;3 reps;10 secs  one foot on balance disc, other foot on floor           PT Education - 10/19/16 2052    Education provided Yes   Education Details cease x 1 viewing/adaptation exercise for now   [employer]) Educated Patient;Child(ren)   Methods Explanation   Comprehension Verbalized understanding  PT Short Term Goals - 09/27/16 1524      PT SHORT TERM GOAL #1   Title Patient demonstrates initial HEP.  (Target Date: 09/28/2016)   Status Achieved     PT SHORT TERM GOAL #2   Title Berg Balance Test >/= 45/56 to indicate lower fall risk.  (Target Date: 09/28/2016)   Baseline 51/56   Status Achieved     PT SHORT TERM GOAL #3   Title Cognitive Timed Up-Go with cane <24sec.    Baseline 23 seconds with naming task   Status Achieved     PT SHORT TERM GOAL #4   Title Pt will report 1/10 dizziness with rolling and supine<>sit    Baseline 1/10   Status Achieved     PT SHORT TERM GOAL #5   Title Patient ambulates with cane scanning environment without balance loss.   (Target Date: 09/28/2016)   Baseline ambulates without cane with head turns without LOB but with slowed gait velocity   Status Achieved           PT Long Term  Goals - 09/27/16 2123      PT LONG TERM GOAL #1   Title Patient demonstrates understanding of ongoing fitness plan / HEP.  (Target Date: 10/26/2016)   Time 2   Period Months   Status On-going     PT LONG TERM GOAL #2   Title Patient has negative testing (0/10) for positional vertigo / dizziness during bed mobility (rolling and supine <> sit) and when bending down to retrieve items from low surfaces/floor.   (Target Date: 10/26/2016)   Time 2   Period Months   Status Revised     PT LONG TERM GOAL #3   Title Berg Balance > or = to 52/56 to indicate lower fall risk.  (Target Date: 10/26/2016)   Time 2   Period Months   Status Revised     PT LONG TERM GOAL #4   Title Cognitive TUG <20 sec to indicate lower fall risk during dual tasking.   (Target Date: 10/26/2016)   Time 2   Period Months   Status Revised     PT LONG TERM GOAL #5   Title Patient ambulates 1000' over uneven pavement/curb and negotiates 3 sets of 12 stairs with rail at MOD I  (Target Date: 10/26/2016)   Time 2   Period Months   Status Revised     PT LONG TERM GOAL #6   Title Pt will ambulate on indoor surfaces x 250' with head turns and changes in direction carrying shopping bags in bilat UE at MOD I level   Time 2   Period Months   Status Revised     PT LONG TERM GOAL #7   Title Complete further vestibular testing & set LTG.  (Target Date: 10/26/2016)   Baseline See LTG #2   Time --   Period --   Status Achieved               Plan - 10/19/16 2053    Clinical Impression Statement Due to pt reporting two days of no dizziness when performing supine <> sit canals not assesed today; performed assessment of other aspects of vestibular system including VOR with HIT and motion sensitivity.  Pt noted to have negative HIT bilaterally-educated pt she could stop adaptation exercises for now.  Pt did demonstrate some motion sensitivity to head down and back up from each knee.  Performed repeated cone pick ups from sitting  to  begin to habituate to reaching down to floor.  Also incorporated single limb stance training.  Will begin to reassess LTG at next two visits and plan to recertify for 4 more weeks to continue to focus on balance training to decrease risk for falls.    Rehab Potential Good   PT Frequency 2x / week   PT Duration Other (comment)   PT Treatment/Interventions ADLs/Self Care Home Management;DME Instruction;Gait training;Stair training;Functional mobility training;Therapeutic activities;Therapeutic exercise;Balance training;Neuromuscular re-education;Patient/family education;Vestibular;Canalith Repostioning   PT Next Visit Plan RECERT DUE BY 1/6-XWRUEA FOR 2X/WEEK, 4 MORE WEEKS FOR BALANCE TRAINING.  monitor BP-take with manual cuff; reassess canals if reporting vertigo and treat as indicated; progress balance activities if canals stay cleared with focus on habituation to reaching to floor, stairs, dual tasking with head turns, ambulation while holding/lifting bags (shopping), compliant surfaces    Consulted and Agree with Plan of Care Patient;Family member/caregiver  daughter-in-law   Family Member Consulted Son      Patient will benefit from skilled therapeutic intervention in order to improve the following deficits and impairments:  Abnormal gait, Decreased activity tolerance, Decreased balance, Cardiopulmonary status limiting activity, Decreased endurance, Decreased mobility, Decreased strength, Dizziness  Visit Diagnosis: Dizziness and giddiness  Unsteadiness on feet  History of falling  Muscle weakness (generalized)  Other abnormalities of gait and mobility     Problem List There are no active problems to display for this patient.  Edman Circle, PT, [name] 10/19/16    9:00 PM    [facility] [address] [place], [place], [address] Phone: [phone]   Fax:  [phone]  Name: Kerry Carlson MRN: [id number] Date of  Birth: 12/26/1925

## 2016-10-22 ENCOUNTER — Encounter: Payer: Self-pay | Admitting: Physical Therapy

## 2016-10-22 ENCOUNTER — Ambulatory Visit: Payer: Medicare Other | Attending: Internal Medicine | Admitting: Physical Therapy

## 2016-10-22 VITALS — BP 150/90 | HR 65

## 2016-10-22 DIAGNOSIS — H8111 Benign paroxysmal vertigo, right ear: Secondary | ICD-10-CM | POA: Diagnosis not present

## 2016-10-22 DIAGNOSIS — R42 Dizziness and giddiness: Secondary | ICD-10-CM | POA: Diagnosis not present

## 2016-10-22 DIAGNOSIS — Z9181 History of falling: Secondary | ICD-10-CM | POA: Insufficient documentation

## 2016-10-22 DIAGNOSIS — R2689 Other abnormalities of gait and mobility: Secondary | ICD-10-CM | POA: Insufficient documentation

## 2016-10-22 DIAGNOSIS — M6281 Muscle weakness (generalized): Secondary | ICD-10-CM | POA: Diagnosis not present

## 2016-10-22 DIAGNOSIS — R2681 Unsteadiness on feet: Secondary | ICD-10-CM | POA: Diagnosis not present

## 2016-10-22 NOTE — Therapy (Signed)
[facility] [address] [place], [place], [address] Phone: [phone]   Fax:  [phone]  Physical Therapy Treatment  Patient Details  Name: Kerry Carlson MRN: [id number] Date of Birth: 1925/07/09 Referring Provider: Domenick Gong, MD  Encounter Date: 10/22/2016      PT End of Session - 10/22/16 1634    Visit Number 16   Number of Visits 18   Date for PT Re-Evaluation 10/26/16   Authorization Type Medicare G-Code every 10th visit   PT Start Time 1450   PT Stop Time 1536   PT Time Calculation (min) 46 min   Activity Tolerance Patient tolerated treatment well  Patient limited by high BP    Behavior During Therapy [facility] for tasks assessed/performed      Past Medical History:  Diagnosis Date  . Diabetes mellitus without complication (HCC)    Type 2  . Hyperlipidemia   . Hypertension   . Thyroid disease    Hypothyroid    Past Surgical History:  Procedure Laterality Date  . ABDOMINAL HYSTERECTOMY    . PACEMAKER INSERTION      Vitals:   10/22/16 1454  BP: (!) 150/90  Pulse: 65  SpO2: 96%        Subjective Assessment - 10/22/16 1500    Subjective Reports having a bad day yesterday-dizzy but is better today.  No dizziness today, was able to bend down to floor without dizziness.  Continues to perform exercises in the bed. Walked into therapy today without cane.   Patient is accompained by: Family member   Limitations Sitting   Patient Stated Goals to get rid of headache & dizziness and be back to normal.    Currently in Pain? No/denies            [facility] PT Assessment - 10/22/16 1502      Ambulation/Gait   Ambulation/Gait Yes   Ambulation/Gait Assistance 5: Supervision   Ambulation Distance (Feet) 600 Feet   Assistive device None   Gait Pattern Step-through pattern;Decreased arm swing - right;Decreased arm swing - left;Decreased step length - right;Decreased step length - left;Decreased stride  length;Decreased dorsiflexion - right;Decreased dorsiflexion - left;Shuffle;Poor foot clearance - left;Poor foot clearance - right   Ambulation Surface Unlevel;Outdoor;Paved   Stairs --     Standardized Balance Assessment   Standardized Balance Assessment Berg Balance Test;Timed Up and Go Test     Berg Balance Test   Sit to Stand Able to stand without using hands and stabilize independently   Standing Unsupported Able to stand safely 2 minutes   Sitting with Back Unsupported but Feet Supported on Floor or Stool Able to sit safely and securely 2 minutes   Stand to Sit Sits safely with minimal use of hands   Transfers Able to transfer safely, minor use of hands   Standing Unsupported with Eyes Closed Able to stand 10 seconds safely   Standing Ubsupported with Feet Together Able to place feet together independently and stand for 1 minute with supervision   From Standing, Reach Forward with Outstretched Arm Can reach confidently >25 cm (10")   From Standing Position, Pick up Object from Floor Able to pick up shoe safely and easily   From Standing Position, Turn to Look Behind Over each Shoulder Looks behind from both sides and weight shifts well   Turn 360 Degrees Able to turn 360 degrees safely in 4 seconds or less   Standing Unsupported, Alternately Place Feet on Step/Stool Able  to stand independently and safely and complete 8 steps in 20 seconds   Standing Unsupported, One Foot in Front Able to plae foot ahead of the other independently and hold 30 seconds   Standing on One Leg Able to lift leg independently and hold equal to or more than 3 seconds   Total Score 52   Berg comment: 52/56     Timed Up and Go Test   TUG Normal TUG;Cognitive TUG   Normal TUG (seconds) 10.01  no cane, no dizziness   Cognitive TUG (seconds) 16.36  animal naming task                             PT Education - 10/22/16 1633    Education provided Yes   Education Details progress made,  areas to continue to focus on with PT   Person(s) Educated Patient;Child(ren)   Methods Explanation   Comprehension Verbalized understanding          PT Short Term Goals - 09/27/16 1524      PT SHORT TERM GOAL #1   Title Patient demonstrates initial HEP.  (Target Date: 09/28/2016)   Status Achieved     PT SHORT TERM GOAL #2   Title Berg Balance Test >/= 45/56 to indicate lower fall risk.  (Target Date: 09/28/2016)   Baseline 51/56   Status Achieved     PT SHORT TERM GOAL #3   Title Cognitive Timed Up-Go with cane <24sec.    Baseline 23 seconds with naming task   Status Achieved     PT SHORT TERM GOAL #4   Title Pt will report 1/10 dizziness with rolling and supine<>sit    Baseline 1/10   Status Achieved     PT SHORT TERM GOAL #5   Title Patient ambulates with cane scanning environment without balance loss.   (Target Date: 09/28/2016)   Baseline ambulates without cane with head turns without LOB but with slowed gait velocity   Status Achieved           PT Long Term Goals - 10/22/16 1639      PT LONG TERM GOAL #1   Title Patient demonstrates understanding of ongoing fitness plan / HEP.  (Target Date: 10/26/2016)   Time 2   Period Months   Status On-going     PT LONG TERM GOAL #2   Title Patient has negative testing (0/10) for positional vertigo / dizziness during bed mobility (rolling and supine <> sit) and when bending down to retrieve items from low surfaces/floor.   (Target Date: 10/26/2016)   Time 2   Period Months     PT LONG TERM GOAL #3   Title Berg Balance > or = to 52/56 to indicate lower fall risk.  (Target Date: 10/26/2016)   Baseline 52/56 on 7/2   Time 2   Period Months   Status Achieved     PT LONG TERM GOAL #4   Title Cognitive TUG <20 sec to indicate lower fall risk during dual tasking.   (Target Date: 10/26/2016)   Baseline 16.36 seconds on 7/2   Time 2   Period Months   Status Achieved     PT LONG TERM GOAL #5   Title Patient ambulates 1000' over  uneven pavement/curb and negotiates 3 sets of 12 stairs with rail at MOD I  (Target Date: 10/26/2016)   Baseline 600' outside without [facility] with supervision; stairs TBA   Time  2   Period Months   Status Partially Met     PT LONG TERM GOAL #6   Title Pt will ambulate on indoor surfaces x 250' with head turns and changes in direction carrying shopping bags in bilat UE at MOD I level   Time 2   Period Months     PT LONG TERM GOAL #7   Title Complete further vestibular testing & set LTG.  (Target Date: 10/26/2016)   Baseline See LTG #2   Status Achieved               Plan - 10/22/16 1634    Clinical Impression Statement Treatment session focused on initiation of LTG assessment; pt has made improvements in balance, safety with dual tasking with gait, improved safety with gait in community setting without AD, decreased dizziness and decreased falls risk.  Will continue to assess LTG next session: stairs, gait indoors with head turns/changes in direction and positional testing if indicated.  Will continue to address vestibular, gait, balance and strength impairments to continue to decrease falls risk and maximize functional mobility independence.   Rehab Potential Good   PT Frequency 2x / week   PT Duration Other (comment)   PT Treatment/Interventions ADLs/Self Care Home Management;DME Instruction;Gait training;Stair training;Functional mobility training;Therapeutic activities;Therapeutic exercise;Balance training;Neuromuscular re-education;Patient/family education;Vestibular;Canalith Repostioning   PT Next Visit Plan FINISH CHECKING LTG #2, second half of #5, #6; RECERT DUE BY 0/4-BTVDFP FOR 2X/WEEK, 4 MORE WEEKS FOR BALANCE TRAINING.  monitor BP-take with manual cuff; reassess canals if reporting vertigo and treat as indicated; Add balance exercises to HEP   Consulted and Agree with Plan of Care Patient;Family member/caregiver  daughter-in-law   Family Member Consulted daughter in law       Patient will benefit from skilled therapeutic intervention in order to improve the following deficits and impairments:  Abnormal gait, Decreased activity tolerance, Decreased balance, Cardiopulmonary status limiting activity, Decreased endurance, Decreased mobility, Decreased strength, Dizziness  Visit Diagnosis: Dizziness and giddiness  Unsteadiness on feet  History of falling  Muscle weakness (generalized)  Other abnormalities of gait and mobility  BPPV (benign paroxysmal positional vertigo), right     Problem List There are no active problems to display for this patient.   Raylene Everts, PT, [name] 10/22/16    4:42 PM    [facility] [address] [place], [place], [address] Phone: [phone]   Fax:  [phone]  Name: ZELLA DEWAN MRN: [id number] Date of Birth: 12-16-1925

## 2016-10-26 ENCOUNTER — Ambulatory Visit: Payer: Medicare Other | Admitting: Physical Therapy

## 2016-10-26 ENCOUNTER — Encounter: Payer: Self-pay | Admitting: Physical Therapy

## 2016-10-26 VITALS — BP 170/88 | HR 86

## 2016-10-26 DIAGNOSIS — R2681 Unsteadiness on feet: Secondary | ICD-10-CM

## 2016-10-26 DIAGNOSIS — R2689 Other abnormalities of gait and mobility: Secondary | ICD-10-CM | POA: Diagnosis not present

## 2016-10-26 DIAGNOSIS — H8111 Benign paroxysmal vertigo, right ear: Secondary | ICD-10-CM | POA: Diagnosis not present

## 2016-10-26 DIAGNOSIS — R42 Dizziness and giddiness: Secondary | ICD-10-CM | POA: Diagnosis not present

## 2016-10-26 DIAGNOSIS — Z9181 History of falling: Secondary | ICD-10-CM | POA: Diagnosis not present

## 2016-10-26 DIAGNOSIS — M6281 Muscle weakness (generalized): Secondary | ICD-10-CM

## 2016-10-26 NOTE — Therapy (Addendum)
[facility] [address] [place], [place], [address] Phone: [phone]   Fax:  [phone]  Physical Therapy Treatment  Patient Details  Name: Kerry Carlson MRN: [id number] Date of Birth: [birth year] Referring Provider: Domenick Gong, MD  Encounter Date: 10/26/2016      PT End of Session - 10/26/16 1545    Visit Number 17   Number of Visits 26  recertified 7/6 x 4 wks   Date for PT Re-Evaluation 11/26/16   Authorization Type Medicare G-Code every 10th visit   PT Start Time 1447   PT Stop Time 1530   PT Time Calculation (min) 43 min   Activity Tolerance Patient tolerated treatment well  Patient limited by high BP    Behavior During Therapy [facility] for tasks assessed/performed      Past Medical History:  Diagnosis Date  . Diabetes mellitus without complication (HCC)    Type 2  . Hyperlipidemia   . Hypertension   . Thyroid disease    Hypothyroid    Past Surgical History:  Procedure Laterality Date  . ABDOMINAL HYSTERECTOMY    . PACEMAKER INSERTION      Vitals:   10/26/16 1457 10/26/16 1523  BP: (!) 172/92 (!) 170/88  Pulse: 82 86  SpO2: 96% 97%        Subjective Assessment - 10/26/16 1452    Subjective Patient denies any falls since last visit. Patient reports waking up feeling dizzy today and lightheaded. She presents to PT using a SPC. Patient reported her head felt "itchy". PT checked scalp, and did not note anything abnormal.    Patient is accompained by: Family member   Limitations Sitting   Patient Stated Goals to get rid of headache & dizziness and be back to normal.    Currently in Pain? No/denies                Vestibular Assessment - 10/26/16 1445      Horizontal Canal Right   Horizontal Canal Right Duration approximately 30s   dizziness reported as 5/10    Horizontal Canal Right Symptoms Geotrophic     Horizontal Canal Left   Horizontal Canal Left Duration 0   Horizontal  Canal Left Symptoms Normal     Positional Sensitivities   Sit to Supine Lightheadedness  lasting <10s    Supine to Sitting Lightheadedness  lasting <10s    Rolling Right Moderate dizziness   Rolling Left Lightheadedness                 [facility] Adult PT Treatment/Exercise - 10/26/16 1445      Ambulation/Gait   Ambulation/Gait Yes   Ambulation/Gait Assistance 5: Supervision;4: Min guard;4: Min assist   Ambulation/Gait Assistance Details PT instructed patient in ambulating with a grocery bag (light items in each bag) without an assistive device. PT required to provide intermittent min guard and constant close S due to increased lateral sway during ambulation. When attempting to perform head turns, required min guard - min A due to misstep, LOB, and reports of dizziness.    Ambulation Distance (Feet) 250 Feet   Assistive device None   Gait Pattern Step-through pattern;Decreased arm swing - right;Decreased arm swing - left;Decreased step length - right;Decreased step length - left;Decreased stride length;Decreased dorsiflexion - right;Decreased dorsiflexion - left;Shuffle;Poor foot clearance - left;Poor foot clearance - right   Ambulation Surface Level;Indoor   Stairs Yes   Stairs Assistance 5: Supervision;4: Restaurant manager, fast food Details (  indicate cue type and reason) Requires close S with intermittent min guard at end of final repetitions due to fatigue    Stair Management Technique One rail Right;Alternating pattern;Forwards   Number of Stairs 4  x3 reps                 PT Education - 10/26/16 1545    Education provided Yes   Education Details plan of care    Person(s) Educated Patient;Other (comment)  grandson - Tommi Rumps    Methods Explanation   Comprehension Verbalized understanding          PT Short Term Goals - 09/27/16 1524      PT SHORT TERM GOAL #1   Title Patient demonstrates initial HEP.  (Target Date: 09/28/2016)   Status Achieved     PT  SHORT TERM GOAL #2   Title Berg Balance Test >/= 45/56 to indicate lower fall risk.  (Target Date: 09/28/2016)   Baseline 51/56   Status Achieved     PT SHORT TERM GOAL #3   Title Cognitive Timed Up-Go with cane <24sec.    Baseline 23 seconds with naming task   Status Achieved     PT SHORT TERM GOAL #4   Title Pt will report 1/10 dizziness with rolling and supine<>sit    Baseline 1/10   Status Achieved     PT SHORT TERM GOAL #5   Title Patient ambulates with cane scanning environment without balance loss.   (Target Date: 09/28/2016)   Baseline ambulates without cane with head turns without LOB but with slowed gait velocity   Status Achieved           PT Long Term Goals - 10/26/16 1504      PT LONG TERM GOAL #1   Title Patient demonstrates understanding of ongoing fitness plan / HEP.  (Target Date: 10/26/2016) (NEW Target Date: 11/23/2016)    Time 1   Period Months   Status Revised     PT LONG TERM GOAL #2   Title Patient has negative testing (0/10) for positional vertigo / dizziness during bed mobility (rolling and supine <> sit) and when bending down to retrieve items from low surfaces/floor.   (Target Date: 10/26/2016) (NEW Target Date: 11/23/2016)   Baseline On Going: 10/26/2016 when rolling to R side patient has geotropic nystagmus lasting <30s and reports dizziness 5/10    Time 1   Period Months   Status Revised     PT LONG TERM GOAL #3   Title Berg Balance > or = to 52/56 to indicate lower fall risk.  (Target Date: 10/26/2016)   Baseline 52/56 on 7/2   Time 2   Period Months   Status Achieved     PT LONG TERM GOAL #4   Title Cognitive TUG <20 sec to indicate lower fall risk during dual tasking.   (Target Date: 10/26/2016)   Baseline 16.36 seconds on 7/2   Time 2   Period Months   Status Achieved     PT LONG TERM GOAL #5   Title Patient ambulates 1000' over uneven pavement/curb and negotiates 3 sets of 12 stairs with rail at MOD I  (Target Date: 10/26/2016) (NEW Target Date:  11/23/2016)    Baseline 600' outside without [facility] with supervision; stairs TBA; 10/26/2016 patient requires close S - min guard when negotiating stairs due to fatigue and reports of dizziness    Time 1   Period Months   Status Revised  PT LONG TERM GOAL #6   Title Pt will ambulate on indoor surfaces x 250' with head turns and changes in direction carrying shopping bags in bilat UE at MOD I level (New Target Date: 11/23/2016)    Baseline On Going (10/26/2016) patient ambulated on indoor surfaces x 245f with shopping bags in BUEs and close S but unable to complete head turns due to dizziness    Time 1   Period Months   Status Revised     PT LONG TERM GOAL #7   Title Complete further vestibular testing & set LTG.  (Target Date: 10/26/2016)   Baseline See LTG #2   Status Achieved               Plan - 10/26/16 1552    Clinical Impression Statement Today's skilled PT session focused on assessing patient's remaining long term goals. Patient has met 3 of 7 long term goals, but is making progress on the remaining 4 long term goals. Based on testing performed today, patient appears to have R horizontal canalithiasis based on patient's geotropic nystagmus lasting ~30s when performing the R horizontal roll test. Patient reports compliance with R rolling habituation exercises, and PT educated patient to continue with these exercises. PT has updated all long term goals and plans to recertify patient for 2x/week for 4 weeks to focus on treatment of positional vertigo and advancing patient's LE strengthening and balance exercises.    Rehab Potential Good   PT Frequency 2x / week   PT Duration Other (comment)   PT Treatment/Interventions ADLs/Self Care Home Management;DME Instruction;Gait training;Stair training;Functional mobility training;Therapeutic activities;Therapeutic exercise;Balance training;Neuromuscular re-education;Patient/family education;Vestibular;Canalith Repostioning   PT Next Visit Plan  monitor BP-take with manual cuff; reassess canals if reporting vertigo and treat as indicated; progress balance exercises as indicated including bending/retrieving objects from the floor and head turns/nods/diagonals    Consulted and Agree with Plan of Care Patient   Family Member Consulted grandson      Patient will benefit from skilled therapeutic intervention in order to improve the following deficits and impairments:  Abnormal gait, Decreased activity tolerance, Decreased balance, Cardiopulmonary status limiting activity, Decreased endurance, Decreased mobility, Decreased strength, Dizziness  Visit Diagnosis: Dizziness and giddiness  Unsteadiness on feet  History of falling  Muscle weakness (generalized)  Other abnormalities of gait and mobility  BPPV (benign paroxysmal positional vertigo), right     Problem List There are no active problems to display for this patient.   VArelia Sneddon SPT  10/26/2016, 4:00 PM    [place] [address]Phone: [phone]  Fax:  [phone] Name: ECARMELINE KOWALMRN: [id number] of Birth: [birth year]

## 2016-11-01 ENCOUNTER — Ambulatory Visit: Payer: Medicare Other | Admitting: Physical Therapy

## 2016-11-01 ENCOUNTER — Encounter: Payer: Self-pay | Admitting: Physical Therapy

## 2016-11-01 DIAGNOSIS — Z9181 History of falling: Secondary | ICD-10-CM | POA: Diagnosis not present

## 2016-11-01 DIAGNOSIS — R2689 Other abnormalities of gait and mobility: Secondary | ICD-10-CM

## 2016-11-01 DIAGNOSIS — M6281 Muscle weakness (generalized): Secondary | ICD-10-CM | POA: Diagnosis not present

## 2016-11-01 DIAGNOSIS — R2681 Unsteadiness on feet: Secondary | ICD-10-CM

## 2016-11-01 DIAGNOSIS — R42 Dizziness and giddiness: Secondary | ICD-10-CM | POA: Diagnosis not present

## 2016-11-01 DIAGNOSIS — H8111 Benign paroxysmal vertigo, right ear: Secondary | ICD-10-CM | POA: Diagnosis not present

## 2016-11-01 NOTE — Therapy (Signed)
[facility] [address] [place], [place], [address] Phone: [phone]   Fax:  [phone]  Physical Therapy Treatment  Patient Details  Name: Kerry Carlson MRN: [id number] Date of Birth: 03-May-1925 Referring Provider: Guerry Bruin, MD  Encounter Date: 11/01/2016      PT End of Session - 11/01/16 1551    Visit Number 18   Number of Visits 26   Date for PT Re-Evaluation 11/26/16   Authorization Type Medicare G-Code every 10th visit   PT Start Time 1442   PT Stop Time 1530   PT Time Calculation (min) 48 min   Equipment Utilized During Treatment Gait belt   Activity Tolerance Patient limited by pain   Behavior During Therapy [facility] for tasks assessed/performed      Past Medical History:  Diagnosis Date  . Diabetes mellitus without complication (HCC)    Type 2  . Hyperlipidemia   . Hypertension   . Thyroid disease    Hypothyroid    Past Surgical History:  Procedure Laterality Date  . ABDOMINAL HYSTERECTOMY    . PACEMAKER INSERTION      There were no vitals filed for this visit.      Subjective Assessment - 11/01/16 1444    Subjective Patient's son reports that she hasn't been dizzy in the last 6 days since her last PT visit. She stated that she has been walking at home and in her neighborhood without any problems.    Patient is accompained by: Family member   Currently in Pain? No/denies             [facility] Adult PT Treatment/Exercise - 11/01/16 1455      Ambulation/Gait   Ambulation/Gait Yes   Ambulation/Gait Assistance 5: Supervision   Ambulation Distance (Feet) 500 Feet  outside, ~599ft inside    Assistive device None   Gait Pattern Step-through pattern;Decreased arm swing - right;Decreased arm swing - left;Decreased step length - right;Decreased step length - left;Decreased stride length;Decreased dorsiflexion - right;Decreased dorsiflexion - left;Shuffle;Poor foot clearance - left;Poor  foot clearance - right   Ambulation Surface Level;Unlevel;Indoor;Outdoor;Paved;Gravel;Grass   Stairs Yes   Stairs Assistance 5: Supervision   Stair Management Technique One rail Right;Alternating pattern;Step to pattern  turns slightly sideways to descend stairs    Number of Stairs 12     Neuro Re-ed    Neuro Re-ed Details  Corner with chair in front for safety, on 1" foam static balance EO 3x30sec, head turns left <> right, up <> down, diagonals 10x, min guard to min assist when LOB (patient reported slight dizziness with motions but it stopped once her head was still), EC 3x30sec static balance, then head turns left <> right, up <> down, and diagonals 10x each; on floor attempted turns in the corner but made patient feel dizzy so stopped; Wall bumps while standing on foam beam in corner, 15x, cues to shift weight by moving her hips, min guard; in parallel bars on small rocker board, weight shifting anterior/ posterior and side to side, min guard with cues to use hips for weight shifting; SLS on floor 5x5sec alternating legs, min guard to min assist with occasional UE support on bars; walked around gym with head turns and stops and pivots with no problems, min guard to min assist, patient tends to stop or slow down considerably when talking, she required breaks due to pain in her back             PT  Short Term Goals - 09/27/16 1524      PT SHORT TERM GOAL #1   Title Patient demonstrates initial HEP.  (Target Date: 09/28/2016)   Status Achieved     PT SHORT TERM GOAL #2   Title Berg Balance Test >/= 45/56 to indicate lower fall risk.  (Target Date: 09/28/2016)   Baseline 51/56   Status Achieved     PT SHORT TERM GOAL #3   Title Cognitive Timed Up-Go with cane <24sec.    Baseline 23 seconds with naming task   Status Achieved     PT SHORT TERM GOAL #4   Title Pt will report 1/10 dizziness with rolling and supine<>sit    Baseline 1/10   Status Achieved     PT SHORT TERM GOAL #5    Title Patient ambulates with cane scanning environment without balance loss.   (Target Date: 09/28/2016)   Baseline ambulates without cane with head turns without LOB but with slowed gait velocity   Status Achieved           PT Long Term Goals - 10/26/16 1504      PT LONG TERM GOAL #1   Title Patient demonstrates understanding of ongoing fitness plan / HEP.  (Target Date: 10/26/2016) (NEW Target Date: 11/23/2016)    Time 1   Period Months   Status Revised     PT LONG TERM GOAL #2   Title Patient has negative testing (0/10) for positional vertigo / dizziness during bed mobility (rolling and supine <> sit) and when bending down to retrieve items from low surfaces/floor.   (Target Date: 10/26/2016) (NEW Target Date: 11/23/2016)   Baseline On Going: 10/26/2016 when rolling to R side patient has geotropic nystagmus lasting <30s and reports dizziness 5/10    Time 1   Period Months   Status Revised     PT LONG TERM GOAL #3   Title Berg Balance > or = to 52/56 to indicate lower fall risk.  (Target Date: 10/26/2016)   Baseline 52/56 on 7/2   Time 2   Period Months   Status Achieved     PT LONG TERM GOAL #4   Title Cognitive TUG <20 sec to indicate lower fall risk during dual tasking.   (Target Date: 10/26/2016)   Baseline 16.36 seconds on 7/2   Time 2   Period Months   Status Achieved     PT LONG TERM GOAL #5   Title Patient ambulates 1000' over uneven pavement/curb and negotiates 3 sets of 12 stairs with rail at MOD I  (Target Date: 10/26/2016) (NEW Target Date: 11/23/2016)    [facility]; stairs TBA; 10/26/2016 patient requires close S - min guard when negotiating stairs due to fatigue and reports of dizziness    Time 1   Period Months   Status Revised     PT LONG TERM GOAL #6   Title Pt will ambulate on indoor surfaces x 250' with head turns and changes in direction carrying shopping bags in bilat UE at MOD I level (New Target Date: 11/23/2016)    Baseline On  Going (10/26/2016) patient ambulated on indoor surfaces x 23850ft with shopping bags in BUEs and close S but unable to complete head turns due to dizziness    Time 1   Period Months   Status Revised     PT LONG TERM GOAL #7   Title Complete further vestibular testing & set LTG.  (Target Date: 10/26/2016)  Baseline See LTG #2   Status Achieved               Plan - 11/01/16 1554    Clinical Impression Statement Today's session focused on gait and vestibular training activities. Patient completed gait around the gym negotiating around other patients, outside on unlevel surfaces, and stairs requiring supervision to min guard on all activities. She completed vestibular activites with mild dizziness but otherwise had no trouble with her vertigo today. She would benefit from continued PT to address residual concerns and progress goals.                             PT Frequency 2x / week   PT Duration Other (comment)   PT Treatment/Interventions ADLs/Self Care Home Management;DME Instruction;Gait training;Stair training;Functional mobility training;Therapeutic activities;Therapeutic exercise;Balance training;Neuromuscular re-education;Patient/family education;Vestibular;Canalith Repostioning   PT Next Visit Plan monitor BP-take with manual cuff; reassess canals if reporting vertigo and treat as indicated; progress balance exercises as indicated including bending/retrieving objects from the floor and head turns/nods/diagonals    Consulted and Agree with Plan of Care Patient;Family member/caregiver   Family Member Consulted son      Patient will benefit from skilled therapeutic intervention in order to improve the following deficits and impairments:  Abnormal gait, Decreased activity tolerance, Decreased balance, Cardiopulmonary status limiting activity, Decreased endurance, Decreased mobility, Decreased strength, Dizziness  Visit Diagnosis: Dizziness and giddiness  Unsteadiness on feet  History  of falling  Muscle weakness (generalized)  Other abnormalities of gait and mobility     Problem List There are no active problems to display for this patient.   Lynett Grimes, SPTA 11/01/2016, 3:58 PM  [facility] [address] [place], [place], [address] Phone: [phone]   Fax:  [phone]  Name: Kerry Carlson MRN: [id number] Date of Birth: 1925/11/14

## 2016-11-02 ENCOUNTER — Ambulatory Visit: Payer: Medicare Other | Admitting: Physical Therapy

## 2016-11-02 ENCOUNTER — Encounter: Payer: Self-pay | Admitting: Physical Therapy

## 2016-11-02 VITALS — BP 152/96

## 2016-11-02 DIAGNOSIS — H8111 Benign paroxysmal vertigo, right ear: Secondary | ICD-10-CM | POA: Diagnosis not present

## 2016-11-02 DIAGNOSIS — R2681 Unsteadiness on feet: Secondary | ICD-10-CM

## 2016-11-02 DIAGNOSIS — R42 Dizziness and giddiness: Secondary | ICD-10-CM | POA: Diagnosis not present

## 2016-11-02 DIAGNOSIS — Z9181 History of falling: Secondary | ICD-10-CM | POA: Diagnosis not present

## 2016-11-02 DIAGNOSIS — R2689 Other abnormalities of gait and mobility: Secondary | ICD-10-CM | POA: Diagnosis not present

## 2016-11-02 DIAGNOSIS — M6281 Muscle weakness (generalized): Secondary | ICD-10-CM | POA: Diagnosis not present

## 2016-11-02 NOTE — Therapy (Signed)
[facility] [address] [place], [place], [address] Phone: [phone]   Fax:  [phone]  Physical Therapy Treatment  Patient Details  Name: Kerry Carlson MRN: [id number] Date of Birth: 04/18/1926 Referring Provider: Guerry Bruin, MD  Encounter Date: 11/02/2016      PT End of Session - 11/02/16 1658    Visit Number 19   Number of Visits 26   Date for PT Re-Evaluation 11/26/16   Authorization Type Medicare G-Code every 10th visit   PT Start Time 1400   PT Stop Time 1444   PT Time Calculation (min) 44 min   Equipment Utilized During Treatment Gait belt   Activity Tolerance Patient tolerated treatment well   Behavior During Therapy [facility] for tasks assessed/performed      Past Medical History:  Diagnosis Date  . Diabetes mellitus without complication (HCC)    Type 2  . Hyperlipidemia   . Hypertension   . Thyroid disease    Hypothyroid    Past Surgical History:  Procedure Laterality Date  . ABDOMINAL HYSTERECTOMY    . PACEMAKER INSERTION      Vitals:   11/02/16 1411  BP: (!) 152/96        Subjective Assessment - 11/02/16 1412    Subjective Patient reports she still has some dizziness when she rolls over (to left seems worse).    Patient is accompained by: Family member   Patient Stated Goals to get rid of headache & dizziness and be back to normal.    Currently in Pain? No/denies                         [facility] Adult PT Treatment/Exercise - 11/02/16 0001      Bed Mobility   Rolling Right 7: Independent   Rolling Left 7: Independent     Transfers   Transfers Sit to Stand;Stand to Sit   Sit to Stand 6: Modified independent (Device/Increase time);With upper extremity assist     Ambulation/Gait   Ambulation/Gait Assistance 5: Supervision   Ambulation/Gait Assistance Details close supervision due to flip-flops and to monitor for signs of saccades when turning head or body   Ambulation Distance (Feet) 500 Feet   Assistive device None   Gait Pattern Step-through pattern;Decreased arm swing - right;Decreased arm swing - left;Decreased stride length;Poor foot clearance - left;Poor foot clearance - right   Ambulation Surface Level   Stairs Assistance 6: Modified independent (Device/Increase time)   Stair Management Technique One rail Right;Forwards   Number of Stairs 12             Balance Exercises - 11/02/16 1654      Balance Exercises: Standing   SLS with Vectors Foam/compliant surface  double-taps to cones bil LEs, x 20   Gait with Head Turns Forward  no imbalance or dizziness   Sidestepping Foam/compliant support;5 reps  red mat   Turning Both   Step Over Hurdles / Cones simulated step over side of tub x 5 each direction   Other Standing Exercises stnding on red mat, bending to pick up varying height objects and turning to place on counter; no unsteadiness or dizziness           PT Education - 11/02/16 1657    Education provided Yes   Education Details risks of wearing flip-flops and whether it's worth a possible fall and loss of independence   Person(s) Educated Patient;Child(ren)   Methods Explanation  Comprehension Verbalized understanding          PT Short Term Goals - 09/27/16 1524      PT SHORT TERM GOAL #1   Title Patient demonstrates initial HEP.  (Target Date: 09/28/2016)   Status Achieved     PT SHORT TERM GOAL #2   Title Berg Balance Test >/= 45/56 to indicate lower fall risk.  (Target Date: 09/28/2016)   Baseline 51/56   Status Achieved     PT SHORT TERM GOAL #3   Title Cognitive Timed Up-Go with cane <24sec.    Baseline 23 seconds with naming task   Status Achieved     PT SHORT TERM GOAL #4   Title Pt will report 1/10 dizziness with rolling and supine<>sit    Baseline 1/10   Status Achieved     PT SHORT TERM GOAL #5   Title Patient ambulates with cane scanning environment without balance loss.   (Target Date:  09/28/2016)   Baseline ambulates without cane with head turns without LOB but with slowed gait velocity   Status Achieved           PT Long Term Goals - 10/26/16 1504      PT LONG TERM GOAL #1   Title Patient demonstrates understanding of ongoing fitness plan / HEP.  (Target Date: 10/26/2016) (NEW Target Date: 11/23/2016)    Time 1   Period Months   Status Revised     PT LONG TERM GOAL #2   Title Patient has negative testing (0/10) for positional vertigo / dizziness during bed mobility (rolling and supine <> sit) and when bending down to retrieve items from low surfaces/floor.   (Target Date: 10/26/2016) (NEW Target Date: 11/23/2016)   Baseline On Going: 10/26/2016 when rolling to R side patient has geotropic nystagmus lasting <30s and reports dizziness 5/10    Time 1   Period Months   Status Revised     PT LONG TERM GOAL #3   Title Berg Balance > or = to 52/56 to indicate lower fall risk.  (Target Date: 10/26/2016)   Baseline 52/56 on 7/2   Time 2   Period Months   Status Achieved     PT LONG TERM GOAL #4   Title Cognitive TUG <20 sec to indicate lower fall risk during dual tasking.   (Target Date: 10/26/2016)   Baseline 16.36 seconds on 7/2   Time 2   Period Months   Status Achieved     PT LONG TERM GOAL #5   Title Patient ambulates 1000' over uneven pavement/curb and negotiates 3 sets of 12 stairs with rail at MOD I  (Target Date: 10/26/2016) (NEW Target Date: 11/23/2016)    Baseline 600' outside without [facility] with supervision; stairs TBA; 10/26/2016 patient requires close S - min guard when negotiating stairs due to fatigue and reports of dizziness    Time 1   Period Months   Status Revised     PT LONG TERM GOAL #6   Title Pt will ambulate on indoor surfaces x 250' with head turns and changes in direction carrying shopping bags in bilat UE at MOD I level (New Target Date: 11/23/2016)    Baseline On Going (10/26/2016) patient ambulated on indoor surfaces x 22ft with shopping bags in BUEs and  close S but unable to complete head turns due to dizziness    Time 1   Period Months   Status Revised     PT LONG TERM GOAL #7  Title Complete further vestibular testing & set LTG.  (Target Date: 10/26/2016)   Baseline See LTG #2   Status Achieved               Plan - 11/02/16 1700    Clinical Impression Statement Session included reassessment of rolling and turns to look for nystagmus (none) and/or dizziness (pt reported none). Also worked on dynamic balance with bending, reaching, turns all on compliant surface. Patient with only instances of imbalance while doing SLS on compliant surface. She is progressing well towards goals.    Rehab Potential Good   PT Frequency 2x / week   PT Duration Other (comment)  additional 4 weeks added to original certification   PT Treatment/Interventions ADLs/Self Care Home Management;DME Instruction;Gait training;Stair training;Functional mobility training;Therapeutic activities;Therapeutic exercise;Balance training;Neuromuscular re-education;Patient/family education;Vestibular;Canalith Repostioning   PT Next Visit Plan monitor BP-take with manual cuff; reassess canals if reporting vertigo and treat as indicated; progress balance exercises as indicated including bending/retrieving objects from the floor and head turns/nods/diagonals    Consulted and Agree with Plan of Care Patient;Family member/caregiver   Family Member Consulted son      Patient will benefit from skilled therapeutic intervention in order to improve the following deficits and impairments:  Abnormal gait, Decreased activity tolerance, Decreased balance, Cardiopulmonary status limiting activity, Decreased endurance, Decreased mobility, Decreased strength, Dizziness  Visit Diagnosis: Unsteadiness on feet  History of falling     Problem List There are no active problems to display for this patient.   Zena AmosLynn P Faylynn Stamos, PT 11/02/2016, 5:17 PM  [facility] [address] [place], [place], [address] Phone: [phone]   Fax:  [phone]  Name: Kerry Carlson MRN: [id number] Date of Birth: 12/31/1925

## 2016-11-05 ENCOUNTER — Encounter: Payer: Self-pay | Admitting: Physical Therapy

## 2016-11-05 ENCOUNTER — Ambulatory Visit: Payer: Medicare Other | Admitting: Physical Therapy

## 2016-11-05 VITALS — BP 138/84

## 2016-11-05 DIAGNOSIS — R2689 Other abnormalities of gait and mobility: Secondary | ICD-10-CM | POA: Diagnosis not present

## 2016-11-05 DIAGNOSIS — R42 Dizziness and giddiness: Secondary | ICD-10-CM | POA: Diagnosis not present

## 2016-11-05 DIAGNOSIS — Z9181 History of falling: Secondary | ICD-10-CM

## 2016-11-05 DIAGNOSIS — M6281 Muscle weakness (generalized): Secondary | ICD-10-CM | POA: Diagnosis not present

## 2016-11-05 DIAGNOSIS — R2681 Unsteadiness on feet: Secondary | ICD-10-CM | POA: Diagnosis not present

## 2016-11-05 DIAGNOSIS — H8111 Benign paroxysmal vertigo, right ear: Secondary | ICD-10-CM | POA: Diagnosis not present

## 2016-11-05 NOTE — Therapy (Signed)
[facility] [address] [place], [place], [address] Phone: [phone]   Fax:  [phone]  Physical Therapy Treatment  Patient Details  Name: Kerry Carlson MRN: [id number] Date of Birth: December 10, 1925 Referring Provider: Guerry Bruin, MD  Encounter Date: 11/05/2016      PT End of Session - 11/05/16 1700    Visit Number 20   Number of Visits 26   Date for PT Re-Evaluation 11/26/16   Authorization Type Medicare G-Code every 10th visit   PT Start Time 1533   PT Stop Time 1620   PT Time Calculation (min) 47 min   Activity Tolerance Patient tolerated treatment well   Behavior During Therapy [facility] for tasks assessed/performed      Past Medical History:  Diagnosis Date  . Diabetes mellitus without complication (HCC)    Type 2  . Hyperlipidemia   . Hypertension   . Thyroid disease    Hypothyroid    Past Surgical History:  Procedure Laterality Date  . ABDOMINAL HYSTERECTOMY    . PACEMAKER INSERTION      Vitals:   11/05/16 1542 11/05/16 1543  BP: 132/84  sit 138/84  stand        Subjective Assessment - 11/05/16 1533    Subjective Reports she had a little spinning with rolling onto her rt ear this morning. Last two mornings has felt dizzy/faint when she sits up and stands up. Lasts ~30 sec. States she waits in sitting for it to pass before standing and waits again in standing before walking.    Patient is accompained by: Family member   Patient Stated Goals to get rid of headache & dizziness and be back to normal.    Currently in Pain? No/denies            [facility] PT Assessment - 11/05/16 0001      Standardized Balance Assessment   Standardized Balance Assessment Timed Up and Go Test                     [facility] Adult PT Treatment/Exercise - 11/05/16 0001      Standardized Balance Assessment   Standardized Balance Assessment Timed Up and Go Test     Timed Up and Go Test   TUG Normal  TUG;Cognitive TUG   Normal TUG (seconds) 10.47   Cognitive TUG (seconds) 16.5             Balance Exercises - 11/05/16 1649      Balance Exercises: Standing   Standing Eyes Opened Narrow base of support (BOS);Head turns;Foam/compliant surface  horizontal and vertical head turns no LOB   Standing Eyes Closed Wide (BOA);Foam/compliant surface  loses balance posteriorly x 3 with delayed response & LOB   SLS Eyes open;Foam/compliant surface;Intermittent upper extremity support;5 reps  <10 sec each leg   Wall Bumps Hip   Wall Bumps-Hips Eyes opened;Anterior/posterior;5 reps   Stepping Strategy Posterior;Foam/compliant surface;UE support  solid floor vs blue mat   Tandem Gait Forward;2 reps   Retro Gait Upper extremity support;Foam/compliant surface  and solid surface   Other Standing Exercises standing on blue mat, reaching to floor for objects and placing on counter, staggered stance vs shoulder width without imbalance; standing on blue mat feet shoulder width and then together while doing zoomball with Bil UEs with LOB needing min assist x 2;              PT Short Term Goals - 09/27/16 1524  PT SHORT TERM GOAL #1   Title Patient demonstrates initial HEP.  (Target Date: 09/28/2016)   Status Achieved     PT SHORT TERM GOAL #2   Title Berg Balance Test >/= 45/56 to indicate lower fall risk.  (Target Date: 09/28/2016)   Baseline 51/56   Status Achieved     PT SHORT TERM GOAL #3   Title Cognitive Timed Up-Go with cane <24sec.    Baseline 23 seconds with naming task   Status Achieved     PT SHORT TERM GOAL #4   Title Pt will report 1/10 dizziness with rolling and supine<>sit    Baseline 1/10   Status Achieved     PT SHORT TERM GOAL #5   Title Patient ambulates with cane scanning environment without balance loss.   (Target Date: 09/28/2016)   Baseline ambulates without cane with head turns without LOB but with slowed gait velocity   Status Achieved           PT  Long Term Goals - 10/26/16 1504      PT LONG TERM GOAL #1   Title Patient demonstrates understanding of ongoing fitness plan / HEP.  (Target Date: 10/26/2016) (NEW Target Date: 11/23/2016)    Time 1   Period Months   Status Revised     PT LONG TERM GOAL #2   Title Patient has negative testing (0/10) for positional vertigo / dizziness during bed mobility (rolling and supine <> sit) and when bending down to retrieve items from low surfaces/floor.   (Target Date: 10/26/2016) (NEW Target Date: 11/23/2016)   Baseline On Going: 10/26/2016 when rolling to R side patient has geotropic nystagmus lasting <30s and reports dizziness 5/10    Time 1   Period Months   Status Revised     PT LONG TERM GOAL #3   Title Berg Balance > or = to 52/56 to indicate lower fall risk.  (Target Date: 10/26/2016)   Baseline 52/56 on 7/2   Time 2   Period Months   Status Achieved     PT LONG TERM GOAL #4   Title Cognitive TUG <20 sec to indicate lower fall risk during dual tasking.   (Target Date: 10/26/2016)   Baseline 16.36 seconds on 7/2   Time 2   Period Months   Status Achieved     PT LONG TERM GOAL #5   Title Patient ambulates 1000' over uneven pavement/curb and negotiates 3 sets of 12 stairs with rail at MOD I  (Target Date: 10/26/2016) (NEW Target Date: 11/23/2016)    Baseline 600' outside without [facility] with supervision; stairs TBA; 10/26/2016 patient requires close S - min guard when negotiating stairs due to fatigue and reports of dizziness    Time 1   Period Months   Status Revised     PT LONG TERM GOAL #6   Title Pt will ambulate on indoor surfaces x 250' with head turns and changes in direction carrying shopping bags in bilat UE at MOD I level (New Target Date: 11/23/2016)    Baseline On Going (10/26/2016) patient ambulated on indoor surfaces x 21250ft with shopping bags in BUEs and close S but unable to complete head turns due to dizziness    Time 1   Period Months   Status Revised     PT LONG TERM GOAL #7   Title  Complete further vestibular testing & set LTG.  (Target Date: 10/26/2016)   Baseline See LTG #2   Status Achieved  Plan - 11/15/16 1702    Clinical Impression Statement Patient arrived wearing tennis shoes! Session included balance training with various surfaces and tasks with pt losing her balance to the point of requiring min assist to recover. Patient reports she has become very cautious with her mobility because "I don't want to fall." Patient will continue to benefit from skilled PT to work towards her LTGs.    Rehab Potential Good   PT Frequency 2x / week   PT Duration Other (comment)  additional 4 weeks added to original certification   PT Treatment/Interventions ADLs/Self Care Home Management;DME Instruction;Gait training;Stair training;Functional mobility training;Therapeutic activities;Therapeutic exercise;Balance training;Neuromuscular re-education;Patient/family education;Vestibular;Canalith Repostioning   PT Next Visit Plan monitor BP-take with manual cuff; reassess canals if reporting vertigo and treat as indicated; stepping strategy (especially posterior), SLS activities; walking and carrying grocery bags   Consulted and Agree with Plan of Care Patient;Family member/caregiver   Family Member Consulted son      Patient will benefit from skilled therapeutic intervention in order to improve the following deficits and impairments:  Abnormal gait, Decreased activity tolerance, Decreased balance, Cardiopulmonary status limiting activity, Decreased endurance, Decreased mobility, Decreased strength, Dizziness  Visit Diagnosis: Unsteadiness on feet  History of falling       G-Codes - Nov 15, 2016 1720    Functional Assessment Tool Used (Outpatient Only) Timed Up-Go 10.47 sec and cognitive TUG 16.50 sec.    Functional Limitation Mobility: Walking and moving around   Mobility: Walking and Moving Around Current Status [phone]) At least 1 percent but less than 20  percent impaired, limited or restricted   Mobility: Walking and Moving Around Goal Status [phone]) At least 1 percent but less than 20 percent impaired, limited or restricted     Physical Therapy Progress Note  Dates of Reporting Period: 09/28/16 to 11/15/16   Objective Reports of Subjective Statement: reports she only has dizziness first thing in the morning and sometimes after her nap  Objective Measurements: see above  Goal Update: NA  Plan: Continue for balance and gait training for fall risk reduction  Reason Skilled Services are Required: remains unsteady; TUG cognitive indicative of increased risk for falls.    Problem List There are no active problems to display for this patient.   Zena Amos, PT 15-Nov-2016, 5:23 PM  [facility] [address] [place], [place], [address] Phone: [phone]   Fax:  [phone]  Name: MEGHAM DWYER MRN: [id number] Date of Birth: January 23, 1926

## 2016-11-09 ENCOUNTER — Ambulatory Visit: Payer: Medicare Other | Admitting: Physical Therapy

## 2016-11-09 ENCOUNTER — Encounter: Payer: Self-pay | Admitting: Physical Therapy

## 2016-11-09 DIAGNOSIS — Z9181 History of falling: Secondary | ICD-10-CM | POA: Diagnosis not present

## 2016-11-09 DIAGNOSIS — R2689 Other abnormalities of gait and mobility: Secondary | ICD-10-CM | POA: Diagnosis not present

## 2016-11-09 DIAGNOSIS — M6281 Muscle weakness (generalized): Secondary | ICD-10-CM | POA: Diagnosis not present

## 2016-11-09 DIAGNOSIS — R42 Dizziness and giddiness: Secondary | ICD-10-CM | POA: Diagnosis not present

## 2016-11-09 DIAGNOSIS — R2681 Unsteadiness on feet: Secondary | ICD-10-CM

## 2016-11-09 DIAGNOSIS — H8111 Benign paroxysmal vertigo, right ear: Secondary | ICD-10-CM | POA: Diagnosis not present

## 2016-11-10 NOTE — Therapy (Signed)
[facility] [address] [place], [place], [address] Phone: [phone]   Fax:  [phone]  Physical Therapy Treatment  Patient Details  Name: Kerry Carlson MRN: [id number] Date of Birth: 04-25-1925 Referring Provider: Guerry Bruin, MD  Encounter Date: 11/09/2016      PT End of Session - 11/10/16 1336    Visit Number 21   Number of Visits 26   Date for PT Re-Evaluation 11/26/16   Authorization Type Medicare G-Code every 10th visit   PT Start Time 1446   PT Stop Time 1529   PT Time Calculation (min) 43 min   Activity Tolerance Patient tolerated treatment well   Behavior During Therapy [facility] for tasks assessed/performed      Past Medical History:  Diagnosis Date  . Diabetes mellitus without complication (HCC)    Type 2  . Hyperlipidemia   . Hypertension   . Thyroid disease    Hypothyroid    Past Surgical History:  Procedure Laterality Date  . ABDOMINAL HYSTERECTOMY    . PACEMAKER INSERTION      There were no vitals filed for this visit.      Subjective Assessment - 11/09/16 1452    Subjective Reports she had a little spinning with rolling onto her rt ear this morning. Last two mornings has felt dizzy/faint when she sits up and stands up. Lasts ~30 sec. States she waits in sitting for it to pass before standing and waits again in standing before walking.    Patient is accompained by: Family member   Patient Stated Goals to get rid of headache & dizziness and be back to normal.    Currently in Pain? No/denies                Vestibular Assessment - 11/10/16 0001      Positional Testing   Horizontal Canal Testing Horizontal Canal Right;Horizontal Canal Left     Horizontal Canal Right   Horizontal Canal Right Duration 0   Horizontal Canal Right Symptoms Normal     Horizontal Canal Left   Horizontal Canal Left Duration 0   Horizontal Canal Left Symptoms Normal                  [facility] Adult PT Treatment/Exercise - 11/10/16 0001      Ambulation/Gait   Ambulation/Gait Assistance 6: Modified independent (Device/Increase time)   Ambulation Distance (Feet) 240 Feet  400, 300   Assistive device None   Gait Pattern Step-through pattern;Decreased arm swing - right;Decreased arm swing - left;Decreased stride length;Poor foot clearance - left;Poor foot clearance - right  drifts lt, rt   Ambulation Surface Level   Gait Comments Including carrying 2 grocery bags each hand; including scanning task to simulate grocery shopping which involved reaching hi and lo with no LOB      Steps x 12 with one rail, modified independent         Balance Exercises - 11/10/16 1335      Balance Exercises: Standing   Other Standing Exercises standing on red mat, putting "groceries" into and then retrieving overhead and lower cabinets              PT Short Term Goals - 09/27/16 1524      PT SHORT TERM GOAL #1   Title Patient demonstrates initial HEP.  (Target Date: 09/28/2016)   Status Achieved     PT SHORT TERM GOAL #2   Title Berg Balance Test >/= 45/56  to indicate lower fall risk.  (Target Date: 09/28/2016)   Baseline 51/56   Status Achieved     PT SHORT TERM GOAL #3   Title Cognitive Timed Up-Go with cane <24sec.    Baseline 23 seconds with naming task   Status Achieved     PT SHORT TERM GOAL #4   Title Pt will report 1/10 dizziness with rolling and supine<>sit    Baseline 1/10   Status Achieved     PT SHORT TERM GOAL #5   Title Patient ambulates with cane scanning environment without balance loss.   (Target Date: 09/28/2016)   Baseline ambulates without cane with head turns without LOB but with slowed gait velocity   Status Achieved           PT Long Term Goals - 10/26/16 1504      PT LONG TERM GOAL #1   Title Patient demonstrates understanding of ongoing fitness plan / HEP.  (Target Date: 10/26/2016) (NEW Target Date: 11/23/2016)    Time 1   Period Months    Status Revised     PT LONG TERM GOAL #2   Title Patient has negative testing (0/10) for positional vertigo / dizziness during bed mobility (rolling and supine <> sit) and when bending down to retrieve items from low surfaces/floor.   (Target Date: 10/26/2016) (NEW Target Date: 11/23/2016)   Baseline On Going: 10/26/2016 when rolling to R side patient has geotropic nystagmus lasting <30s and reports dizziness 5/10    Time 1   Period Months   Status Revised     PT LONG TERM GOAL #3   Title Berg Balance > or = to 52/56 to indicate lower fall risk.  (Target Date: 10/26/2016)   Baseline 52/56 on 7/2   Time 2   Period Months   Status Achieved     PT LONG TERM GOAL #4   Title Cognitive TUG <20 sec to indicate lower fall risk during dual tasking.   (Target Date: 10/26/2016)   Baseline 16.36 seconds on 7/2   Time 2   Period Months   Status Achieved     PT LONG TERM GOAL #5   Title Patient ambulates 1000' over uneven pavement/curb and negotiates 3 sets of 12 stairs with rail at MOD I  (Target Date: 10/26/2016) (NEW Target Date: 11/23/2016)    [facility]; stairs TBA; 10/26/2016 patient requires close S - min guard when negotiating stairs due to fatigue and reports of dizziness    Time 1   Period Months   Status Revised     PT LONG TERM GOAL #6   Title Pt will ambulate on indoor surfaces x 250' with head turns and changes in direction carrying shopping bags in bilat UE at MOD I level (New Target Date: 11/23/2016)    Baseline On Going (10/26/2016) patient ambulated on indoor surfaces x 23750ft with shopping bags in BUEs and close S but unable to complete head turns due to dizziness    Time 1   Period Months   Status Revised     PT LONG TERM GOAL #7   Title Complete further vestibular testing & set LTG.  (Target Date: 10/26/2016)   Baseline See LTG #2   Status Achieved               Plan - 11/10/16 1337    Clinical Impression Statement Session focused on  functional gait and dual tasks while walking and balance training. Patient  continues with no apparent BPPV although has reports of intermittent very brief sense of vertigo with her habituation exercise (rolling). Patient making excellent progress and discussed she may not need all remaining appointments.    Rehab Potential Good   PT Frequency 2x / week   PT Duration Other (comment)  additional 4 weeks added to original certification   PT Treatment/Interventions ADLs/Self Care Home Management;DME Instruction;Gait training;Stair training;Functional mobility training;Therapeutic activities;Therapeutic exercise;Balance training;Neuromuscular re-education;Patient/family education;Vestibular;Canalith Repostioning   PT Next Visit Plan monitor BP-take with manual cuff; reassess canals if reporting vertigo and treat as indicated; stepping strategy (especially posterior), SLS activities; walking with dual tasks; unlevel surface; consider if she will need all remaining visits   Consulted and Agree with Plan of Care Patient;Family member/caregiver   Family Member Consulted son      Patient will benefit from skilled therapeutic intervention in order to improve the following deficits and impairments:  Abnormal gait, Decreased activity tolerance, Decreased balance, Cardiopulmonary status limiting activity, Decreased endurance, Decreased mobility, Decreased strength, Dizziness  Visit Diagnosis: Unsteadiness on feet  Dizziness and giddiness     Problem List There are no active problems to display for this patient.   Zena Amos, PT 11/10/2016, 1:41 PM  [place] [address] [place], [place], [address] Phone: [phone]   Fax:  [phone]  Name: AIDAH FORQUER MRN: [id number] Date of Birth: 09-07-1925

## 2016-11-12 ENCOUNTER — Encounter: Payer: Self-pay | Admitting: Physical Therapy

## 2016-11-12 ENCOUNTER — Ambulatory Visit: Payer: Medicare Other | Admitting: Physical Therapy

## 2016-11-12 VITALS — BP 148/80

## 2016-11-12 DIAGNOSIS — R2681 Unsteadiness on feet: Secondary | ICD-10-CM | POA: Diagnosis not present

## 2016-11-12 DIAGNOSIS — R2689 Other abnormalities of gait and mobility: Secondary | ICD-10-CM | POA: Diagnosis not present

## 2016-11-12 DIAGNOSIS — R42 Dizziness and giddiness: Secondary | ICD-10-CM | POA: Diagnosis not present

## 2016-11-12 DIAGNOSIS — M6281 Muscle weakness (generalized): Secondary | ICD-10-CM | POA: Diagnosis not present

## 2016-11-12 DIAGNOSIS — H8111 Benign paroxysmal vertigo, right ear: Secondary | ICD-10-CM | POA: Diagnosis not present

## 2016-11-12 DIAGNOSIS — Z9181 History of falling: Secondary | ICD-10-CM | POA: Diagnosis not present

## 2016-11-12 NOTE — Therapy (Signed)
[place] [address] [place], [place], [address] Phone: [phone]   Fax:  [phone]  Physical Therapy Treatment  Patient Details  Name: Kerry Carlson MRN: [id number] Date of Birth: 05-30-1925 Referring Provider: Domenick Gong, MD  Encounter Date: 11/12/2016      PT End of Session - 11/12/16 1413    Visit Number 22   Number of Visits 26   Date for PT Re-Evaluation 11/26/16   Authorization Type Medicare G-Code every 10th visit   PT Start Time 1316   PT Stop Time 1359   PT Time Calculation (min) 43 min   Activity Tolerance Patient tolerated treatment well   Behavior During Therapy [facility] for tasks assessed/performed      Past Medical History:  Diagnosis Date  . Diabetes mellitus without complication (HCC)    Type 2  . Hyperlipidemia   . Hypertension   . Thyroid disease    Hypothyroid    Past Surgical History:  Procedure Laterality Date  . ABDOMINAL HYSTERECTOMY    . PACEMAKER INSERTION      Vitals:   11/12/16 1323 11/12/16 1326 11/12/16 1340 11/12/16 1346  BP: (!) 144/88  sitting 128/72 standing (!) 146/94  After walk 1000 ft (!) 148/80 after climb steps        Subjective Assessment - 11/12/16 1346    Subjective Went shopping at [employer] last night with no problems. Saturday and Sunday has had lots of dizziness--"faint feeling or going to black out." Reports it is usually when she changes position. Has even felt it when she does her supine rolling.    Patient is accompained by: Family member   Patient Stated Goals to get rid of headache & dizziness and be back to normal.                          [facility] Adult PT Treatment/Exercise - 11/12/16 1406      Ambulation/Gait   Ambulation/Gait Assistance 6: Modified independent (Device/Increase time)   Ambulation/Gait Assistance Details slightly wide BOS, slight drift to rt or left with no LOB.    Ambulation Distance (Feet) 1000 Feet    Assistive device None   Gait Pattern Step-through pattern;Decreased arm swing - right;Decreased arm swing - left;Decreased stride length;Poor foot clearance - left;Poor foot clearance - right   Ambulation Surface Level;Unlevel;Outdoor;Paved;Gravel;Grass;Other (comment)  mulch   Stairs Yes   Stairs Assistance 6: Modified independent (Device/Increase time)   Stair Management Technique Two rails;Alternating pattern;Step to pattern;Forwards   Number of Stairs 36   Height of Stairs 6   Ramp 6: Modified independent (Device)   Curb 6: Modified independent (Device/increase time)     High Level Balance   High Level Balance Activities Side stepping;Backward walking;Direction changes;Turns;Sudden stops;Head turns   High Level Balance Comments no imbalance noted with any of these tasks             Balance Exercises - 11/12/16 1411      Balance Exercises: Standing   Standing, One Foot on a Step Eyes open;8 inch;Cognitive challenge;4 inch  tossing medium ball up/down x 10 at each height and each leg   Balance Beam black beam horizontal with wide BOS and intermittent UE support in // bars   Step Over Hurdles / [place] over and back black beam x 10 with small LOB when stepping posteriorly x 1 (recovered herself)    Other Standing Exercises bending to pick up object off  floor x 2 no dizziness             PT Short Term Goals - 09/27/16 1524      PT SHORT TERM GOAL #1   Title Patient demonstrates initial HEP.  (Target Date: 09/28/2016)   Status Achieved     PT SHORT TERM GOAL #2   Title Berg Balance Test >/= 45/56 to indicate lower fall risk.  (Target Date: 09/28/2016)   Baseline 51/56   Status Achieved     PT SHORT TERM GOAL #3   Title Cognitive Timed Up-Go with cane <24sec.    Baseline 23 seconds with naming task   Status Achieved     PT SHORT TERM GOAL #4   Title Pt will report 1/10 dizziness with rolling and supine<>sit    Baseline 1/10   Status Achieved     PT SHORT TERM  GOAL #5   Title Patient ambulates with cane scanning environment without balance loss.   (Target Date: 09/28/2016)   Baseline ambulates without cane with head turns without LOB but with slowed gait velocity   Status Achieved           PT Long Term Goals - 11/12/16 1420      PT LONG TERM GOAL #1   Title Patient demonstrates understanding of ongoing fitness plan / HEP.  (Target Date: 10/26/2016) (NEW Target Date: 11/23/2016)    Time 1   Period Months   Status Revised     PT LONG TERM GOAL #2   Title Patient has negative testing (0/10) for positional vertigo / dizziness during bed mobility (rolling and supine <> sit) and when bending down to retrieve items from low surfaces/floor.   (Target Date: 10/26/2016) (NEW Target Date: 11/23/2016)   Baseline On Going: 10/26/2016 when rolling to R side patient has geotropic nystagmus lasting <30s and reports dizziness 5/10    Time 1   Period Months   Status Revised     PT LONG TERM GOAL #3   Title Berg Balance > or = to 52/56 to indicate lower fall risk.  (Target Date: 10/26/2016)   Baseline 52/56 on 7/2   Time 2   Period Months   Status Achieved     PT LONG TERM GOAL #4   Title Cognitive TUG <20 sec to indicate lower fall risk during dual tasking.   (Target Date: 10/26/2016)   Baseline 16.36 seconds on 7/2   Time 2   Period Months   Status Achieved     PT LONG TERM GOAL #5   Title Patient ambulates 1000' over uneven pavement/curb and negotiates 3 sets of 12 stairs with rail at MOD I  (Target Date: 10/26/2016) (NEW Target Date: 11/23/2016)    Baseline 600' outside without [facility] with supervision; stairs TBA; 10/26/2016 patient requires close S - min guard when negotiating stairs due to fatigue and reports of dizziness; 11/12/16 1000 ft, 36 steps (3 flights of 12)   Time 1   Period Months   Status Achieved     PT LONG TERM GOAL #6   Title Pt will ambulate on indoor surfaces x 250' with head turns and changes in direction carrying shopping bags in bilat UE at  MOD I level (New Target Date: 11/23/2016)    Baseline On Going (10/26/2016) patient ambulated on indoor surfaces x 292f with shopping bags in BUEs and close S but unable to complete head turns due to dizziness; 11/09/16 met   Time 1   Period Months  Status Achieved     PT LONG TERM GOAL #7   Title Complete further vestibular testing & set LTG.  (Target Date: 10/26/2016)   Baseline See LTG #2   Status Achieved               Plan - 11/12/16 1416    Clinical Impression Statement Session focused on gait on unlevel surfaces and with multiple distractions. BP was monitored x4 during session (although pt with no reports of dizziness) with variation noted. See vital signs. Patient has met 5 of 7 LTGs. Discussed with patient, son and daughter-in-law how well she did today and will likely be able to discharge from PT soon.    Rehab Potential Good   PT Frequency 2x / week   PT Duration Other (comment)  additional 4 weeks added to original certification   PT Treatment/Interventions ADLs/Self Care Home Management;DME Instruction;Gait training;Stair training;Functional mobility training;Therapeutic activities;Therapeutic exercise;Balance training;Neuromuscular re-education;Patient/family education;Vestibular;Canalith Repostioning   PT Next Visit Plan monitor BP-take with manual cuff; reassess canals if reporting vertigo and treat as indicated; Discuss activity/exercise plan when discharges from PT; stepping strategy (especially posterior), SLS activities; walking with dual tasks; unlevel surface; consider if she will need all remaining visits   Consulted and Agree with Plan of Care Patient;Family member/caregiver   Family Member Consulted son      Patient will benefit from skilled therapeutic intervention in order to improve the following deficits and impairments:  Abnormal gait, Decreased activity tolerance, Decreased balance, Cardiopulmonary status limiting activity, Decreased endurance, Decreased  mobility, Decreased strength, Dizziness  Visit Diagnosis: Unsteadiness on feet     Problem List There are no active problems to display for this patient.   Rexanne Mano, PT 11/12/2016, 2:25 PM  [place] [address] [place], [place], [address] Phone: [phone]   Fax:  [phone]  Name: KYMBERLIE BRAZEAU MRN: [id number] Date of Birth: 1925-08-03

## 2016-11-13 DIAGNOSIS — I10 Essential (primary) hypertension: Secondary | ICD-10-CM | POA: Diagnosis not present

## 2016-11-13 DIAGNOSIS — E784 Other hyperlipidemia: Secondary | ICD-10-CM | POA: Diagnosis not present

## 2016-11-13 DIAGNOSIS — I480 Paroxysmal atrial fibrillation: Secondary | ICD-10-CM | POA: Diagnosis not present

## 2016-11-13 DIAGNOSIS — N183 Chronic kidney disease, stage 3 (moderate): Secondary | ICD-10-CM | POA: Diagnosis not present

## 2016-11-13 DIAGNOSIS — E1129 Type 2 diabetes mellitus with other diabetic kidney complication: Secondary | ICD-10-CM | POA: Diagnosis not present

## 2016-11-13 DIAGNOSIS — Z7901 Long term (current) use of anticoagulants: Secondary | ICD-10-CM | POA: Diagnosis not present

## 2016-11-13 DIAGNOSIS — Z950 Presence of cardiac pacemaker: Secondary | ICD-10-CM | POA: Diagnosis not present

## 2016-11-13 DIAGNOSIS — I129 Hypertensive chronic kidney disease with stage 1 through stage 4 chronic kidney disease, or unspecified chronic kidney disease: Secondary | ICD-10-CM | POA: Diagnosis not present

## 2016-11-13 DIAGNOSIS — I6789 Other cerebrovascular disease: Secondary | ICD-10-CM | POA: Diagnosis not present

## 2016-11-13 DIAGNOSIS — E038 Other specified hypothyroidism: Secondary | ICD-10-CM | POA: Diagnosis not present

## 2016-11-13 DIAGNOSIS — Z6824 Body mass index (BMI) 24.0-24.9, adult: Secondary | ICD-10-CM | POA: Diagnosis not present

## 2016-11-15 ENCOUNTER — Ambulatory Visit: Payer: Medicare Other | Admitting: Physical Therapy

## 2016-11-15 ENCOUNTER — Encounter: Payer: Self-pay | Admitting: Physical Therapy

## 2016-11-15 VITALS — BP 145/92 | HR 70

## 2016-11-15 DIAGNOSIS — R2681 Unsteadiness on feet: Secondary | ICD-10-CM

## 2016-11-15 DIAGNOSIS — R42 Dizziness and giddiness: Secondary | ICD-10-CM | POA: Diagnosis not present

## 2016-11-15 DIAGNOSIS — Z9181 History of falling: Secondary | ICD-10-CM | POA: Diagnosis not present

## 2016-11-15 DIAGNOSIS — R2689 Other abnormalities of gait and mobility: Secondary | ICD-10-CM

## 2016-11-15 DIAGNOSIS — H8111 Benign paroxysmal vertigo, right ear: Secondary | ICD-10-CM | POA: Diagnosis not present

## 2016-11-15 DIAGNOSIS — M6281 Muscle weakness (generalized): Secondary | ICD-10-CM | POA: Diagnosis not present

## 2016-11-18 NOTE — Therapy (Signed)
[facility] [address] [place], [place], [address] Phone: [phone]   Fax:  [phone]  Physical Therapy Treatment  Patient Details  Name: Kerry Carlson MRN: [id number] Date of Birth: [birth year] Referring Provider: Domenick Gong, MD  Encounter Date: 11/15/2016   11/15/16 1453  PT Visits / Re-Eval  Visit Number 23  Number of Visits 26  Date for PT Re-Evaluation 11/26/16  Authorization  Authorization Type Medicare G-Code every 10th visit  PT Time Calculation  PT Start Time 1450  PT Stop Time 1530  PT Time Calculation (min) 40 min  PT - End of Session  Equipment Utilized During Treatment Gait belt  Activity Tolerance Patient tolerated treatment well  Behavior During Therapy [facility] for tasks assessed/performed      Past Medical History:  Diagnosis Date  . Diabetes mellitus without complication (HCC)    Type 2  . Hyperlipidemia   . Hypertension   . Thyroid disease    Hypothyroid    Past Surgical History:  Procedure Laterality Date  . ABDOMINAL HYSTERECTOMY    . PACEMAKER INSERTION      Vitals:   11/15/16 1452  BP: (!) 145/92  Pulse: 70     11/15/16 1452  Symptoms/Limitations  Subjective No new complaints. No fall or pain to report. Had "a touch" of dizziness this am when she woke up, went away quickly.   Patient is accompained by: Family member  Limitations Sitting  Patient Stated Goals to get rid of headache & dizziness and be back to normal.   Pain Assessment  Currently in Pain? No/denies  Pain Score 0      11/15/16 1457  Transfers  Transfers Sit to Stand;Stand to Sit  Sit to Stand 6: Modified independent (Device/Increase time);With upper extremity assist  Stand to Sit 6: Modified independent (Device/Increase time);Without upper extremity assist;To chair/3-in-1  Ambulation/Gait  Ambulation/Gait Yes  Ambulation/Gait Assistance 5: Supervision  Ambulation/Gait Assistance Details pt  with staggering with gait both left and right, increased on compliant surfaces. no LOB noted.   Ambulation Distance (Feet) 550 Feet  Assistive device None  Gait Pattern Step-through pattern;Decreased arm swing - right;Decreased arm swing - left;Decreased stride length;Poor foot clearance - left;Poor foot clearance - right  Ambulation Surface Level;Unlevel;Indoor;Outdoor;Paved;Gravel;Grass;Other (comment) (rubber mulch)  Neuro Re-ed   Neuro Re-ed Details  along ~50  foot hallway: forward gait with head turns left<>right, up<>down and diagonals both ways x 2-4 laps each with min guard assist. slight decrease in gait speed and minor veering noted with gait.       11/15/16 1530  Balance Exercises: Standing  Standing Eyes Closed Wide (BOA);Foam/compliant surface;Head turns;Other reps (comment);20 secs;Limitations  SLS with Vectors Foam/compliant surface  Stepping Strategy Anterior;Posterior;Lateral;Foam/compliant surface;Other reps (comment)  Balance Beam standing with feet across blue foam beam: alternating fwd stepping to floor and back onto beam x 10 each side, alternating backward stepping to floor and back onto beam x 10 each side. min assist for balance with intermittent touch to bars for balance. cues on technique and weight shifting.   Balance Exercises: Standing  Standing Eyes Closed Limitations standing with feet across blue foam beam: EC no head movements, progressing to EC head movements left<>right, up<>down and diagonals both ways. min guard to min assist for balance.  SLS with Vectors Limitations foam bubble in semi circle pattern on red mat: toe taps around bubbles x 3 laps each side, alternating sides. min assist for balance without UE support.   Rebounder  Limitations stepping strategies: 4 square steppping on floor, progressing to on mat with min guard assist except for one episode on mod assist for balance recovery with stepping backwards due to brief episode of dizziness.             PT Short Term Goals - 09/27/16 1524      PT SHORT TERM GOAL #1   Title Patient demonstrates initial HEP.  (Target Date: 09/28/2016)   Status Achieved     PT SHORT TERM GOAL #2   Title Berg Balance Test >/= 45/56 to indicate lower fall risk.  (Target Date: 09/28/2016)   Baseline 51/56   Status Achieved     PT SHORT TERM GOAL #3   Title Cognitive Timed Up-Go with cane <24sec.    Baseline 23 seconds with naming task   Status Achieved     PT SHORT TERM GOAL #4   Title Pt will report 1/10 dizziness with rolling and supine<>sit    Baseline 1/10   Status Achieved     PT SHORT TERM GOAL #5   Title Patient ambulates with cane scanning environment without balance loss.   (Target Date: 09/28/2016)   Baseline ambulates without cane with head turns without LOB but with slowed gait velocity   Status Achieved           PT Long Term Goals - 11/12/16 1420      PT LONG TERM GOAL #1   Title Patient demonstrates understanding of ongoing fitness plan / HEP.  (Target Date: 10/26/2016) (NEW Target Date: 11/23/2016)    Time 1   Period Months   Status Revised     PT LONG TERM GOAL #2   Title Patient has negative testing (0/10) for positional vertigo / dizziness during bed mobility (rolling and supine <> sit) and when bending down to retrieve items from low surfaces/floor.   (Target Date: 10/26/2016) (NEW Target Date: 11/23/2016)   Baseline On Going: 10/26/2016 when rolling to R side patient has geotropic nystagmus lasting <30s and reports dizziness 5/10    Time 1   Period Months   Status Revised     PT LONG TERM GOAL #3   Title Berg Balance > or = to 52/56 to indicate lower fall risk.  (Target Date: 10/26/2016)   Baseline 52/56 on 7/2   Time 2   Period Months   Status Achieved     PT LONG TERM GOAL #4   Title Cognitive TUG <20 sec to indicate lower fall risk during dual tasking.   (Target Date: 10/26/2016)   Baseline 16.36 seconds on 7/2   Time 2   Period Months   Status Achieved      PT LONG TERM GOAL #5   Title Patient ambulates 1000' over uneven pavement/curb and negotiates 3 sets of 12 stairs with rail at MOD I  (Target Date: 10/26/2016) (NEW Target Date: 11/23/2016)    Baseline 600' outside without [facility] with supervision; stairs TBA; 10/26/2016 patient requires close S - min guard when negotiating stairs due to fatigue and reports of dizziness; 11/12/16 1000 ft, 36 steps (3 flights of 12)   Time 1   Period Months   Status Achieved     PT LONG TERM GOAL #6   Title Pt will ambulate on indoor surfaces x 250' with head turns and changes in direction carrying shopping bags in bilat UE at MOD I level (New Target Date: 11/23/2016)    Baseline On Going (10/26/2016) patient ambulated on indoor surfaces  x 239f with shopping bags in BUEs and close S but unable to complete head turns due to dizziness; 11/09/16 met   Time 1   Period Months   Status Achieved     PT LONG TERM GOAL #7   Title Complete further vestibular testing & set LTG.  (Target Date: 10/26/2016)   Baseline See LTG #2   Status Achieved        11/15/16 1453  Plan  Clinical Impression Statement Today's skilled session continued to address dynamic gait and high level balance activities without any significant issues reported. Pt had a brief episode of dizziness with one  activity that resolved quickly. Pt is progressing toward goals and should benefit from continued PT to progress toward unmet goals.   Pt will benefit from skilled therapeutic intervention in order to improve on the following deficits Abnormal gait;Decreased activity tolerance;Decreased balance;Cardiopulmonary status limiting activity;Decreased endurance;Decreased mobility;Decreased strength;Dizziness  Rehab Potential Good  PT Frequency 2x / week  PT Duration Other (comment) (additional 4 weeks added to original certification)  PT Treatment/Interventions ADLs/Self Care Home Management;DME Instruction;Gait training;Stair training;Functional mobility  training;Therapeutic activities;Therapeutic exercise;Balance training;Neuromuscular re-education;Patient/family education;Vestibular;Canalith Repostioning  PT Next Visit Plan monitor BP-take with manual cuff; reassess canals if reporting vertigo and treat as indicated; Discuss activity/exercise plan when discharges from PT; stepping strategy (especially posterior), SLS activities; walking with dual tasks; unlevel surface; consider if she will need all remaining visits  Consulted and Agree with Plan of Care Patient;Family member/caregiver  Family Member Consulted son     Patient will benefit from skilled therapeutic intervention in order to improve the following deficits and impairments:  Abnormal gait, Decreased activity tolerance, Decreased balance, Cardiopulmonary status limiting activity, Decreased endurance, Decreased mobility, Decreased strength, Dizziness  Visit Diagnosis: Unsteadiness on feet  Dizziness and giddiness  Other abnormalities of gait and mobility     Problem List There are no active problems to display for this patient.   KWillow Ora PTA, [address]Frank[address] STinton FallsGCorrell Kek[place] [phone]/29/18, 10:19 AM   Name: Kerry RUSSETTMRN: [id number] of Birth: 128-Apr-1927

## 2016-11-19 ENCOUNTER — Encounter: Payer: Self-pay | Admitting: Physical Therapy

## 2016-11-19 ENCOUNTER — Ambulatory Visit: Payer: Medicare Other | Admitting: Physical Therapy

## 2016-11-19 VITALS — BP 149/99 | HR 99

## 2016-11-19 DIAGNOSIS — R2689 Other abnormalities of gait and mobility: Secondary | ICD-10-CM

## 2016-11-19 DIAGNOSIS — R2681 Unsteadiness on feet: Secondary | ICD-10-CM | POA: Diagnosis not present

## 2016-11-19 DIAGNOSIS — Z9181 History of falling: Secondary | ICD-10-CM | POA: Diagnosis not present

## 2016-11-19 DIAGNOSIS — R42 Dizziness and giddiness: Secondary | ICD-10-CM | POA: Diagnosis not present

## 2016-11-19 DIAGNOSIS — H8111 Benign paroxysmal vertigo, right ear: Secondary | ICD-10-CM | POA: Diagnosis not present

## 2016-11-19 DIAGNOSIS — M6281 Muscle weakness (generalized): Secondary | ICD-10-CM

## 2016-11-19 NOTE — Therapy (Signed)
[facility] [address] [place], [place], [address] Phone: [phone]   Fax:  [phone]  Physical Therapy Treatment  Patient Details  Name: Kerry Carlson MRN: [id number] Date of Birth: [birth year] Referring Provider: Domenick Gong, MD  Encounter Date: 11/19/2016      PT End of Session - 11/19/16 1628    Visit Number 24   Number of Visits 26   Date for PT Re-Evaluation 11/26/16   Authorization Type Medicare G-Code every 10th visit   PT Start Time 1535   PT Stop Time 1615   PT Time Calculation (min) 40 min   Activity Tolerance Patient tolerated treatment well   Behavior During Therapy [facility] for tasks assessed/performed      Past Medical History:  Diagnosis Date  . Diabetes mellitus without complication (HCC)    Type 2  . Hyperlipidemia   . Hypertension   . Thyroid disease    Hypothyroid    Past Surgical History:  Procedure Laterality Date  . ABDOMINAL HYSTERECTOMY    . PACEMAKER INSERTION      Vitals:   11/19/16 1553 11/19/16 1626  BP: (!) 157/93 (!) 149/99  Pulse: 73 99        Subjective Assessment - 11/19/16 1536    Subjective No falls or other issues; still having a little bit of dizziness first thing in the am but resolves quickly.  Still using hospital bed.  Continues to take short walks each day but is limited due to back pain on hills.     Patient is accompained by: Family member   Limitations Sitting   Patient Stated Goals to get rid of headache & dizziness and be back to normal.    Currently in Pain? No/denies            [facility] PT Assessment - 11/19/16 1601      Ambulation/Gait   Ambulation/Gait Yes   Ambulation/Gait Assistance 6: Modified independent (Device/Increase time)   Ambulation/Gait Assistance Details mild veering and shuffling noted; pt reports this is premorbid; limited in distance by back pain and SOB   Ambulation Distance (Feet) 1000 Feet   Assistive device None    Gait Pattern Step-through pattern;Decreased step length - right;Decreased step length - left;Decreased stride length;Shuffle;Decreased trunk rotation;Poor foot clearance - left;Poor foot clearance - right   Ambulation Surface Unlevel;Outdoor;Paved;Grass     Standardized Balance Assessment   Standardized Balance Assessment Timed Up and Go Test     Timed Up and Go Test   TUG Normal TUG;Cognitive TUG   Normal TUG (seconds) 9.88   Cognitive TUG (seconds) 18.01   TUG Comments multiple attempts for cognitive TUG (9 second diffierence with difficulty with naming hockey teams)            Vestibular Assessment - 11/19/16 1543      Positional Sensitivities   Sit to Supine No dizziness   Supine to Left Side No dizziness   Supine to Right Side No dizziness   Supine to Sitting No dizziness   Nose to Right Knee No dizziness   Right Knee to Sitting No dizziness   Nose to Left Knee No dizziness   Left Knee to Sitting No dizziness   Head Turning x 5 No dizziness   Head Nodding x 5 No dizziness   Pivot Right in Standing No dizziness   Pivot Left in Standing No dizziness   Rolling Right No dizziness   Rolling Left No dizziness  PT Education - 11/19/16 1627    Education provided Yes   Education Details plan for D/C at next visit and plan to discuss HEP/community wellness    Person(s) Educated Patient;Child(ren)   Methods Explanation   Comprehension Verbalized understanding          PT Short Term Goals - 09/27/16 1524      PT SHORT TERM GOAL #1   Title Patient demonstrates initial HEP.  (Target Date: 09/28/2016)   Status Achieved     PT SHORT TERM GOAL #2   Title Berg Balance Test >/= 45/56 to indicate lower fall risk.  (Target Date: 09/28/2016)   Baseline 51/56   Status Achieved     PT SHORT TERM GOAL #3   Title Cognitive Timed Up-Go with cane <24sec.    Baseline 23 seconds with naming task   Status Achieved     PT SHORT TERM GOAL #4    Title Pt will report 1/10 dizziness with rolling and supine<>sit    Baseline 1/10   Status Achieved     PT SHORT TERM GOAL #5   Title Patient ambulates with cane scanning environment without balance loss.   (Target Date: 09/28/2016)   Baseline ambulates without cane with head turns without LOB but with slowed gait velocity   Status Achieved           PT Long Term Goals - 11/19/16 1632      PT LONG TERM GOAL #1   Title Patient demonstrates understanding of ongoing fitness plan / HEP.  (Target Date: 10/26/2016) (NEW Target Date: 11/23/2016)    Status On-going     PT LONG TERM GOAL #2   Title Patient has negative testing (0/10) for positional vertigo / dizziness during bed mobility (rolling and supine <> sit) and when bending down to retrieve items from low surfaces/floor.  (NEW Target Date: 11/23/2016)   Baseline met 7/30   Status Achieved     PT LONG TERM GOAL #4   Title Cognitive TUG <20 sec to indicate lower fall risk during dual tasking.      Baseline TUG 9 seconds, Cog TUG 16-18 seconds   Status Achieved     PT LONG TERM GOAL #5   Title Patient ambulates 1000' over uneven pavement/curb and negotiates 3 sets of 12 stairs with rail at MOD I  (NEW Target Date: 11/23/2016)    Baseline 1000 outside over uneven pavement Mod I   Status Achieved               Plan - 11/19/16 1629    Clinical Impression Statement Treatment session focused on beginning re-assessment of vestibular system and LTG.  Pt demonstrating no positional vertigo and no motion sensitivity.  Pt continues to demonstrate low falls risk on regular TUG but with dual cognitive task pt continues to be at increased falls risk during dual tasks.  Pt continues to demonstrate decreased endurance and limited tolerance to walking outside over pavement due to SOB and low back pain.  Will likely include walking program into HEP.  Plan to finish assessing LTG and D/C next visit.   Rehab Potential Good   PT Frequency 2x / week    PT Duration Other (comment)  additional 4 weeks added to original certification   PT Treatment/Interventions ADLs/Self Care Home Management;DME Instruction;Gait training;Stair training;Functional mobility training;Therapeutic activities;Therapeutic exercise;Balance training;Neuromuscular re-education;Patient/family education;Vestibular;Canalith Repostioning   PT Next Visit Plan monitor BP-take with manual cuff; review and adjust final HEP, walking program, stairs, FOTO-D/C!  Consulted and Agree with Plan of Care Patient;Family member/caregiver   Family Member Consulted daughter in law      Patient will benefit from skilled therapeutic intervention in order to improve the following deficits and impairments:  Abnormal gait, Decreased activity tolerance, Decreased balance, Cardiopulmonary status limiting activity, Decreased endurance, Decreased mobility, Decreased strength, Dizziness  Visit Diagnosis: Unsteadiness on feet  Dizziness and giddiness  Other abnormalities of gait and mobility  History of falling  Muscle weakness (generalized)     Problem List There are no active problems to display for this patient.  Raylene Everts, PT, [name] 11/19/16    4:35 PM    [facility] [address] [place], [place], [address] Phone: [phone]   Fax:  [phone]  Name: PAULENE TAYAG MRN: [id number] Date of Birth: 1926/01/13

## 2016-11-22 ENCOUNTER — Ambulatory Visit: Payer: Medicare Other | Attending: Internal Medicine | Admitting: Physical Therapy

## 2016-11-22 ENCOUNTER — Encounter: Payer: Self-pay | Admitting: Physical Therapy

## 2016-11-22 VITALS — BP 105/81 | HR 67

## 2016-11-22 DIAGNOSIS — Z9181 History of falling: Secondary | ICD-10-CM | POA: Diagnosis not present

## 2016-11-22 DIAGNOSIS — M6281 Muscle weakness (generalized): Secondary | ICD-10-CM | POA: Diagnosis not present

## 2016-11-22 DIAGNOSIS — R2681 Unsteadiness on feet: Secondary | ICD-10-CM

## 2016-11-22 DIAGNOSIS — R42 Dizziness and giddiness: Secondary | ICD-10-CM | POA: Diagnosis not present

## 2016-11-22 DIAGNOSIS — H8111 Benign paroxysmal vertigo, right ear: Secondary | ICD-10-CM | POA: Diagnosis not present

## 2016-11-22 DIAGNOSIS — R2689 Other abnormalities of gait and mobility: Secondary | ICD-10-CM | POA: Diagnosis not present

## 2016-11-22 NOTE — Therapy (Signed)
[facility] [address] [place], [place], [address] Phone: [phone]   Fax:  [phone]  Physical Therapy Treatment and D/C Summary  Patient Details  Name: Kerry Carlson MRN: [id number] Date of Birth: Aug 18, 1925 Referring Provider: Domenick Gong, MD  Encounter Date: 11/22/2016      PT End of Session - 11/22/16 1702    Visit Number 25   Number of Visits 26   Date for PT Re-Evaluation 11/26/16  D/C today   Authorization Type Medicare G-Code every 10th visit   PT Start Time 1450   PT Stop Time 1533   PT Time Calculation (min) 43 min   Activity Tolerance Patient tolerated treatment well   Behavior During Therapy [facility] for tasks assessed/performed      Past Medical History:  Diagnosis Date  . Diabetes mellitus without complication (HCC)    Type 2  . Hyperlipidemia   . Hypertension   . Thyroid disease    Hypothyroid    Past Surgical History:  Procedure Laterality Date  . ABDOMINAL HYSTERECTOMY    . PACEMAKER INSERTION      Vitals:   11/22/16 1458  BP: 105/81  Pulse: 67        Subjective Assessment - 11/22/16 1457    Subjective Pt has been driving locally without any dizziness or issues; continues to walk every morning but not in the evening due to weather.   Patient is accompained by: Family member   Limitations Sitting   Patient Stated Goals to get rid of headache & dizziness and be back to normal.    Currently in Pain? No/denies     Reviewed and had pt return demonstrate each exercise below for HEP:  Single Leg - Eyes Open    Holding support with one hand, lift right leg while maintaining balance over other leg.  Hold_10-12___ seconds.  Repeat lifting left leg. Repeat _2___ times per leg. Do __2__ sessions per day.  Feet Heel-Toe "Tandem"    One hand touching counter top, walk a straight line bringing one foot directly in front of the other. Repeat for 4 laps along counter top per  session. Do _2___ sessions per day.    WALK FORWARDS AND THEN BACKWARDS ALONG COUNTER TOP WITH EYES CLOSED-HAVE ONE HAND TOUCHING COUNTER TOP.   REPEAT 4 LAPS 2 TIMES A DAY   Feet Together- Eyes Closed      With eyes closed and feet together, stand for 10-12 seconds keeping your balance right in the middle Repeat 2 times per session. Do 2 sessions per day.    Feet Partial Heel-Toe, Head Motion - Eyes Open    With eyes open, right foot partially in front of the other, move head slowly: up and down 10 times, side to side 10 times Repeat with left foot forwards Repeat 2 times per session. Do 2 sessions per day.                           PT Education - 11/22/16 1701    Education provided Yes   Education Details HEP for maintenance at D/C; walking plan   Person(s) Educated Patient;Child(ren)   Methods Explanation;Demonstration;Handout   Comprehension Verbalized understanding;Returned demonstration          PT Short Term Goals - 09/27/16 1524      PT SHORT TERM GOAL #1   Title Patient demonstrates initial HEP.  (Target Date: 09/28/2016)   Status Achieved  PT SHORT TERM GOAL #2   Title Berg Balance Test >/= 45/56 to indicate lower fall risk.  (Target Date: 09/28/2016)   Baseline 51/56   Status Achieved     PT SHORT TERM GOAL #3   Title Cognitive Timed Up-Go with cane <24sec.    Baseline 23 seconds with naming task   Status Achieved     PT SHORT TERM GOAL #4   Title Pt will report 1/10 dizziness with rolling and supine<>sit    Baseline 1/10   Status Achieved     PT SHORT TERM GOAL #5   Title Patient ambulates with cane scanning environment without balance loss.   (Target Date: 09/28/2016)   Baseline ambulates without cane with head turns without LOB but with slowed gait velocity   Status Achieved           PT Long Term Goals - 11/22/16 1702      PT LONG TERM GOAL #1   Title Patient demonstrates understanding of ongoing fitness plan /  HEP.  (Target Date: 10/26/2016) (NEW Target Date: 11/23/2016)    Status Achieved     PT LONG TERM GOAL #2   Title Patient has negative testing (0/10) for positional vertigo / dizziness during bed mobility (rolling and supine <> sit) and when bending down to retrieve items from low surfaces/floor.  (NEW Target Date: 11/23/2016)   Baseline met 7/30   Status Achieved     PT LONG TERM GOAL #4   Title Cognitive TUG <20 sec to indicate lower fall risk during dual tasking.      Baseline TUG 9 seconds, Cog TUG 16-18 seconds   Status Achieved     PT LONG TERM GOAL #5   Title Patient ambulates 1000' over uneven pavement/curb and negotiates 3 sets of 12 stairs with rail at MOD I  (NEW Target Date: 11/23/2016)    Baseline 1000 outside over uneven pavement Mod I   Status Achieved               Plan - 11/22/16 1702    Clinical Impression Statement Treatment session today focused on revising HEP to incorporate more standing balance, strengthening and vestibular exercises to maintain gains made during therapy.  Reviewed each exercise with pt, with son observing, and pt return demonstrated all exercises safely.  Discussed plan for returning to regular flat bed; advised son to first lower hospital bed to flat for 1-2 weeks (to keep rail to keep pt from rolling around in bed) and then transition to flat bed if pt remains symptom free.  Also discussed ways to slowly increase walking distance in neighborhood to prevent increased back pain and risk for falling.  Pt has made excellent progress and has met all LTG.  Pt is safe to D/C from therapy.   PT Treatment/Interventions ADLs/Self Care Home Management;DME Instruction;Gait training;Stair training;Functional mobility training;Therapeutic activities;Therapeutic exercise;Balance training;Neuromuscular re-education;Patient/family education;Vestibular;Canalith Repostioning   PT Next Visit Plan D/C today   Consulted and Agree with Plan of Care Patient;Family  member/caregiver   Family Member Consulted son      Patient will benefit from skilled therapeutic intervention in order to improve the following deficits and impairments:  Abnormal gait, Decreased activity tolerance, Decreased balance, Cardiopulmonary status limiting activity, Decreased endurance, Decreased mobility, Decreased strength, Dizziness  Visit Diagnosis: Unsteadiness on feet  Dizziness and giddiness  Other abnormalities of gait and mobility  History of falling  Muscle weakness (generalized)  BPPV (benign paroxysmal positional vertigo), right  G-Codes - 11/22/16 1707    Functional Assessment Tool Used (Outpatient Only) TUG and Cog TUG   Functional Limitation Mobility: Walking and moving around   Mobility: Walking and Moving Around Goal Status [phone]) At least 1 percent but less than 20 percent impaired, limited or restricted   Mobility: Walking and Moving Around Discharge Status [phone]) At least 1 percent but less than 20 percent impaired, limited or restricted      Problem List There are no active problems to display for this patient.  PHYSICAL THERAPY DISCHARGE SUMMARY  Visits from Start of Care: 25  Current functional level related to goals / functional outcomes: See LTG above; pt met all LTG   Remaining deficits: Back pain, impaired activity tolerance/endurance, impaired balance   Education / Equipment: HEP  Plan: Patient agrees to discharge.  Patient goals were met. Patient is being discharged due to meeting the stated rehab goals.  ?????    Raylene Everts, PT, [name] 11/22/16    5:09 PM    [facility] [address] [place], [place], [address] Phone: [phone]   Fax:  [phone]  Name: DIERDRA SALAMEH MRN: [id number] Date of Birth: Jun 05, 1925

## 2016-11-22 NOTE — Patient Instructions (Signed)
Single Leg - Eyes Open    Holding support with one hand, lift right leg while maintaining balance over other leg.  Hold_10-12___ seconds.  Repeat lifting left leg. Repeat _2___ times per leg. Do __2__ sessions per day.  Feet Heel-Toe "Tandem"    One hand touching counter top, walk a straight line bringing one foot directly in front of the other. Repeat for 4 laps along counter top per session. Do _2___ sessions per day.    WALK FORWARDS AND THEN BACKWARDS ALONG COUNTER TOP WITH EYES CLOSED-HAVE ONE HAND TOUCHING COUNTER TOP.   REPEAT 4 LAPS 2 TIMES A DAY   Feet Together- Eyes Closed      With eyes closed and feet together, stand for 10-12 seconds keeping your balance right in the middle Repeat 2 times per session. Do 2 sessions per day.    Feet Partial Heel-Toe, Head Motion - Eyes Open    With eyes open, right foot partially in front of the other, move head slowly: up and down 10 times, side to side 10 times Repeat with left foot forwards Repeat 2 times per session. Do 2 sessions per day.

## 2016-11-26 ENCOUNTER — Ambulatory Visit: Payer: PRIVATE HEALTH INSURANCE | Admitting: Physical Therapy

## 2016-11-29 ENCOUNTER — Ambulatory Visit: Payer: PRIVATE HEALTH INSURANCE | Admitting: Physical Therapy

## 2016-12-07 DIAGNOSIS — H01001 Unspecified blepharitis right upper eyelid: Secondary | ICD-10-CM | POA: Diagnosis not present

## 2016-12-07 DIAGNOSIS — H01002 Unspecified blepharitis right lower eyelid: Secondary | ICD-10-CM | POA: Diagnosis not present

## 2016-12-07 DIAGNOSIS — H5711 Ocular pain, right eye: Secondary | ICD-10-CM | POA: Diagnosis not present

## 2016-12-07 DIAGNOSIS — H0011 Chalazion right upper eyelid: Secondary | ICD-10-CM | POA: Diagnosis not present

## 2016-12-19 DIAGNOSIS — H04123 Dry eye syndrome of bilateral lacrimal glands: Secondary | ICD-10-CM | POA: Diagnosis not present

## 2016-12-19 DIAGNOSIS — Z961 Presence of intraocular lens: Secondary | ICD-10-CM | POA: Diagnosis not present

## 2016-12-19 DIAGNOSIS — H52203 Unspecified astigmatism, bilateral: Secondary | ICD-10-CM | POA: Diagnosis not present

## 2016-12-19 DIAGNOSIS — E119 Type 2 diabetes mellitus without complications: Secondary | ICD-10-CM | POA: Diagnosis not present

## 2017-01-21 DIAGNOSIS — I495 Sick sinus syndrome: Secondary | ICD-10-CM | POA: Diagnosis not present

## 2017-01-21 DIAGNOSIS — Z45010 Encounter for checking and testing of cardiac pacemaker pulse generator [battery]: Secondary | ICD-10-CM | POA: Diagnosis not present

## 2017-01-21 DIAGNOSIS — Z950 Presence of cardiac pacemaker: Secondary | ICD-10-CM | POA: Diagnosis not present

## 2017-01-22 DIAGNOSIS — I10 Essential (primary) hypertension: Secondary | ICD-10-CM | POA: Diagnosis not present

## 2017-01-22 DIAGNOSIS — E785 Hyperlipidemia, unspecified: Secondary | ICD-10-CM | POA: Diagnosis not present

## 2017-01-22 DIAGNOSIS — I2729 Other secondary pulmonary hypertension: Secondary | ICD-10-CM | POA: Diagnosis not present

## 2017-01-22 DIAGNOSIS — I340 Nonrheumatic mitral (valve) insufficiency: Secondary | ICD-10-CM | POA: Diagnosis not present

## 2017-01-22 DIAGNOSIS — I482 Chronic atrial fibrillation: Secondary | ICD-10-CM | POA: Diagnosis not present

## 2017-01-22 DIAGNOSIS — I361 Nonrheumatic tricuspid (valve) insufficiency: Secondary | ICD-10-CM | POA: Diagnosis not present

## 2017-02-05 DIAGNOSIS — I10 Essential (primary) hypertension: Secondary | ICD-10-CM | POA: Diagnosis not present

## 2017-02-05 DIAGNOSIS — R42 Dizziness and giddiness: Secondary | ICD-10-CM | POA: Diagnosis not present

## 2017-02-05 DIAGNOSIS — E785 Hyperlipidemia, unspecified: Secondary | ICD-10-CM | POA: Diagnosis not present

## 2017-02-05 DIAGNOSIS — Z950 Presence of cardiac pacemaker: Secondary | ICD-10-CM | POA: Diagnosis not present

## 2017-02-05 DIAGNOSIS — E1142 Type 2 diabetes mellitus with diabetic polyneuropathy: Secondary | ICD-10-CM | POA: Diagnosis not present

## 2017-02-05 DIAGNOSIS — E039 Hypothyroidism, unspecified: Secondary | ICD-10-CM | POA: Diagnosis not present

## 2017-02-05 DIAGNOSIS — I482 Chronic atrial fibrillation: Secondary | ICD-10-CM | POA: Diagnosis not present

## 2017-02-07 DIAGNOSIS — H8112 Benign paroxysmal vertigo, left ear: Secondary | ICD-10-CM | POA: Diagnosis not present

## 2017-02-07 DIAGNOSIS — R42 Dizziness and giddiness: Secondary | ICD-10-CM | POA: Diagnosis not present

## 2017-02-08 DIAGNOSIS — I482 Chronic atrial fibrillation: Secondary | ICD-10-CM | POA: Diagnosis not present

## 2017-02-08 DIAGNOSIS — E1142 Type 2 diabetes mellitus with diabetic polyneuropathy: Secondary | ICD-10-CM | POA: Diagnosis not present

## 2017-02-08 DIAGNOSIS — E785 Hyperlipidemia, unspecified: Secondary | ICD-10-CM | POA: Diagnosis not present

## 2017-02-08 DIAGNOSIS — I10 Essential (primary) hypertension: Secondary | ICD-10-CM | POA: Diagnosis not present

## 2017-02-12 DIAGNOSIS — I340 Nonrheumatic mitral (valve) insufficiency: Secondary | ICD-10-CM | POA: Diagnosis not present

## 2017-02-12 DIAGNOSIS — I482 Chronic atrial fibrillation: Secondary | ICD-10-CM | POA: Diagnosis not present

## 2017-02-12 DIAGNOSIS — I361 Nonrheumatic tricuspid (valve) insufficiency: Secondary | ICD-10-CM | POA: Diagnosis not present

## 2017-02-12 DIAGNOSIS — E785 Hyperlipidemia, unspecified: Secondary | ICD-10-CM | POA: Diagnosis not present

## 2017-02-12 DIAGNOSIS — I2729 Other secondary pulmonary hypertension: Secondary | ICD-10-CM | POA: Diagnosis not present

## 2017-02-12 DIAGNOSIS — I10 Essential (primary) hypertension: Secondary | ICD-10-CM | POA: Diagnosis not present

## 2017-02-13 DIAGNOSIS — R42 Dizziness and giddiness: Secondary | ICD-10-CM | POA: Diagnosis not present

## 2017-02-13 DIAGNOSIS — H8112 Benign paroxysmal vertigo, left ear: Secondary | ICD-10-CM | POA: Diagnosis not present

## 2017-02-14 DIAGNOSIS — Z45010 Encounter for checking and testing of cardiac pacemaker pulse generator [battery]: Secondary | ICD-10-CM | POA: Diagnosis not present

## 2017-02-19 DIAGNOSIS — I10 Essential (primary) hypertension: Secondary | ICD-10-CM | POA: Diagnosis not present

## 2017-03-06 DIAGNOSIS — I10 Essential (primary) hypertension: Secondary | ICD-10-CM | POA: Diagnosis not present

## 2017-03-06 DIAGNOSIS — I482 Chronic atrial fibrillation: Secondary | ICD-10-CM | POA: Diagnosis not present

## 2017-03-06 DIAGNOSIS — Z950 Presence of cardiac pacemaker: Secondary | ICD-10-CM | POA: Diagnosis not present

## 2017-03-06 DIAGNOSIS — E039 Hypothyroidism, unspecified: Secondary | ICD-10-CM | POA: Diagnosis not present

## 2017-03-06 DIAGNOSIS — I272 Pulmonary hypertension, unspecified: Secondary | ICD-10-CM | POA: Diagnosis not present

## 2017-03-06 DIAGNOSIS — E785 Hyperlipidemia, unspecified: Secondary | ICD-10-CM | POA: Diagnosis not present

## 2017-03-06 DIAGNOSIS — I340 Nonrheumatic mitral (valve) insufficiency: Secondary | ICD-10-CM | POA: Diagnosis not present

## 2017-03-06 DIAGNOSIS — I071 Rheumatic tricuspid insufficiency: Secondary | ICD-10-CM | POA: Diagnosis not present

## 2017-03-06 DIAGNOSIS — Z23 Encounter for immunization: Secondary | ICD-10-CM | POA: Diagnosis not present

## 2017-03-06 DIAGNOSIS — E1142 Type 2 diabetes mellitus with diabetic polyneuropathy: Secondary | ICD-10-CM | POA: Diagnosis not present

## 2018-11-18 ENCOUNTER — Telehealth: Payer: Self-pay | Admitting: Nurse Practitioner

## 2018-11-18 ENCOUNTER — Other Ambulatory Visit: Payer: Self-pay | Admitting: Orthopedic Surgery

## 2018-11-18 DIAGNOSIS — G8929 Other chronic pain: Secondary | ICD-10-CM

## 2018-11-18 NOTE — Telephone Encounter (Signed)
Phone call to patient to verify medication list and allergies for myelogram procedure. Pt instructed to hold Xarelto for 24hrs (pending approval and further recommendation from cardiologist Dr. Randolm Idol) prior to myelogram appointment time. Pt verbalized understanding. Pre and post procedure instructions reviewed with pt. Thinner hold request faxed to Dr. Shirlean Schlein, awaiting reply and further instruction.

## 2018-12-10 ENCOUNTER — Other Ambulatory Visit: Payer: PRIVATE HEALTH INSURANCE

## 2019-11-06 ENCOUNTER — Encounter: Payer: Self-pay | Admitting: Cardiology

## 2019-11-06 ENCOUNTER — Ambulatory Visit: Payer: Medicare Other | Admitting: Cardiology

## 2019-11-06 ENCOUNTER — Other Ambulatory Visit: Payer: Self-pay

## 2019-11-06 VITALS — BP 136/84 | HR 96 | Resp 16 | Ht 65.0 in | Wt 152.8 lb

## 2019-11-06 DIAGNOSIS — I495 Sick sinus syndrome: Secondary | ICD-10-CM

## 2019-11-06 DIAGNOSIS — N1832 Chronic kidney disease, stage 3b: Secondary | ICD-10-CM

## 2019-11-06 DIAGNOSIS — I4821 Permanent atrial fibrillation: Secondary | ICD-10-CM

## 2019-11-06 DIAGNOSIS — I10 Essential (primary) hypertension: Secondary | ICD-10-CM

## 2019-11-06 NOTE — Progress Notes (Signed)
Primary Physician/Referring:  Gaspar Garbe, MD  Patient ID: Kerry Carlson, female    DOB: Apr 04, 1926, 86 y.o.   MRN: [id number]  Chief Complaint  Patient presents with  . New Patient (Initial Visit)    Re-establish Care   HPI:    Kerry Carlson  is a 86 y.o. permanent atrial fibrillation, hypertension, diabetes with diabetic polyneuropathy, hyperlipidemia and has a MRI compatible Medtronic single-chamber pacemaker implanted 07/20/2016 while in [place]. She presented with marked bradycardia and syncope leading to pacemaker implantation.  Past medical history significant for hypertension, hyperlipidemia, controlled diabetes mellitus and hypothyroidism.  Patient had moved back to [place] and then eventually moved to [place] now she is back in [place] with her son, wants to reestablish care.  She has no specific complaints today.  Past Medical History:  Diagnosis Date  . Diabetes mellitus without complication (HCC)    Type 2  . Hyperlipidemia   . Hypertension   . Thyroid disease    Hypothyroid   Past Surgical History:  Procedure Laterality Date  . ABDOMINAL HYSTERECTOMY    . PACEMAKER INSERTION  10/11/2016   Medtronic Azure MRI single-chamber pacemaker in [facility]   Family History  Problem Relation Age of Onset  . Stroke Mother   . Stroke Father     Social History   Tobacco Use  . Smoking status: Never Smoker  . Smokeless tobacco: Never Used  Substance Use Topics  . Alcohol use: No   Marital Status: Widowed  ROS  Review of Systems  Cardiovascular: Negative for chest pain, dyspnea on exertion and leg swelling.  Musculoskeletal: Positive for arthritis and back pain. Negative for falls.  Gastrointestinal: Negative for melena.  Neurological: Positive for dizziness.   Objective  Blood pressure 136/84, pulse 96, resp. rate 16, height 5\' 5"  (1.651 m), weight 152 lb 12.8 oz (69.3 kg).  Vitals with BMI 11/06/2019 11/22/2016 11/19/2016  Height 5\' 5"  - -  Weight 152 lbs 13  oz - -  BMI 25.43 - -  Systolic 136 105 11/21/2016  Diastolic 84 81 99  Pulse 96 67 99     Physical Exam Cardiovascular:     Rate and Rhythm: Normal rate and regular rhythm.     Pulses: Intact distal pulses.     Heart sounds: Normal heart sounds. No murmur heard.  No gallop.      Comments: No leg edema, no JVD. Pulmonary:     Effort: Pulmonary effort is normal.     Breath sounds: Normal breath sounds.  Abdominal:     General: Bowel sounds are normal.     Palpations: Abdomen is soft.    Laboratory examination:   External labs:   Cholesterol, total 129.000 m 10/08/2019 HDL 44 MG/DL 782 LDL 10/10/2019 mg 10/08/2019 Triglycerides 86.000 10/08/2019  A1C 5.9% % 10/08/2019 TSH 0.820 10/08/2019   Hemoglobin 13.400 g/d 10/08/2019  Creatinine, Serum 1.600 mg/ 10/08/2019 Potassium 3.800 08/21/2016 Magnesium N/D ALT (SGPT) 6.000 units 10/08/2019  Medications and allergies   Allergies  Allergen Reactions  . Aleve [Naproxen] Hives  . Aspirin Nausea And Vomiting  . Penicillins Hives    Had a really long time ago, caused hives Has patient had a PCN reaction causing immediate rash, facial/tongue/throat swelling, SOB or lightheadedness with hypotension: unknown Has patient had a PCN reaction causing severe rash involving mucus membranes or skin necrosis: unknown Has patient had a PCN reaction that required hospitalization unknown Has patient had a PCN reaction occurring within the last 10  years:no If all of the above answers are "NO", then may proceed with Cephalosporin use.     Current Outpatient Medications  Medication Instructions  . acetaminophen (TYLENOL) 1,000 mg, Oral, Every 6 hours PRN  . atenolol (TENORMIN) 100 mg, Oral, Daily  . famotidine (PEPCID) 20 MG tablet No dose, route, or frequency recorded.  Marland Kitchen levothyroxine (SYNTHROID) 100 mcg, Oral, Daily before breakfast  . meclizine (ANTIVERT) 25 mg, Oral, 3 times daily PRN  . pravastatin (PRAVACHOL) 10 mg, Oral, Daily  .  Rivaroxaban (XARELTO) 15 mg, Oral, Daily with supper  . spironolactone (ALDACTONE) 25 mg, Oral, Daily    Radiology:   No results found.  Cardiac Studies:   Echocardiogram done in [place] 6 months ago, report not available.  EKG  EKG 11/06/2018: Probably atrial fibrillation and ventricularly paced rhythm.  No further analysis.  Assessment     ICD-10-CM   1. Essential hypertension  I10 EKG 12-Lead  2. Permanent atrial fibrillation (HCC)  I48.21   3. Sick sinus syndrome (HCC)  I49.5   4. Stage 3b chronic kidney disease  N18.32     Medications Discontinued During This Encounter  Medication Reason  . docusate sodium (COLACE) 100 MG capsule No longer needed (for PRN medications)  . lisinopril (ZESTRIL) 20 MG tablet Discontinued by provider  . metFORMIN (GLUCOPHAGE) 500 MG tablet Discontinued by provider    CHA2DS2-VASc Score is 5.  Yearly risk of stroke: 7.2% (F, A, HTN, DM).  Score of 1=0.6; 2=2.2; 3=3.2; 4=4.8; 5=7.2; 6=9.8; 7=>9.8) -(CHF; HTN; vasc disease DM,  Female = 1; Age <65 =0; 65-74 = 1,  >75 =2; stroke/embolism= 2).    Recommendations:   Kerry Carlson  is a 86 y.o. permanent atrial fibrillation, hypertension, diabetes with diabetic polyneuropathy, diabetic nephropathy stage III, no diet controlled diabetes, hyperlipidemia and has a MRI compatible Medtronic single-chamber pacemaker implanted 07/20/2016 while in [place]. She presented with marked bradycardia and syncope leading to pacemaker implantation.  Past medical history significant for hypertension, hyperlipidemia, controlled diabetes mellitus and hypothyroidism.  She has no specific complaints today, on EKG today appears to be in underlying atrial fibrillation with V paced rhythm.  Blood pressure is well controlled.  I reviewed her external labs, in 2018 her renal function was normal.  She needs close monitoring of renal function and will continue to follow-up with her PCP.  She is appropriately on Xarelto 15 mg after  supper daily.  Lipids are also well controlled.  I will see her back for follow-up in 6 months, we will also take over her pacemaker management including remote pacemaker transmissions.   Yates Decamp, MD, [facility] 11/06/2019, 6:15 PM Office: [phone]

## 2020-02-03 ENCOUNTER — Other Ambulatory Visit: Payer: Self-pay | Admitting: Internal Medicine

## 2020-02-03 ENCOUNTER — Ambulatory Visit
Admission: RE | Admit: 2020-02-03 | Discharge: 2020-02-03 | Disposition: A | Discharge status: Home / Self Care | Payer: Medicare Other | Source: Ambulatory Visit | Attending: Internal Medicine | Admitting: Internal Medicine

## 2020-02-03 DIAGNOSIS — W19XXXA Unspecified fall, initial encounter: Secondary | ICD-10-CM

## 2020-02-03 DIAGNOSIS — R42 Dizziness and giddiness: Secondary | ICD-10-CM

## 2020-03-16 ENCOUNTER — Other Ambulatory Visit: Payer: Self-pay

## 2020-03-16 MED ORDER — ATENOLOL 100 MG PO TABS: 100.0000 mg | ORAL_TABLET | Freq: Every day | ORAL | 0 refills | Status: DC

## 2020-04-07 ENCOUNTER — Other Ambulatory Visit: Payer: Self-pay | Admitting: Internal Medicine

## 2020-04-07 DIAGNOSIS — R1310 Dysphagia, unspecified: Secondary | ICD-10-CM

## 2020-04-12 ENCOUNTER — Ambulatory Visit
Admission: RE | Admit: 2020-04-12 | Discharge: 2020-04-12 | Disposition: A | Discharge status: Home / Self Care | Payer: Medicare Other | Source: Ambulatory Visit | Attending: Internal Medicine | Admitting: Internal Medicine

## 2020-04-12 DIAGNOSIS — R1310 Dysphagia, unspecified: Secondary | ICD-10-CM

## 2020-05-12 ENCOUNTER — Ambulatory Visit: Payer: PRIVATE HEALTH INSURANCE | Admitting: Cardiology

## 2020-05-16 ENCOUNTER — Telehealth: Payer: Self-pay | Admitting: Cardiology

## 2020-05-18 NOTE — Telephone Encounter (Signed)
Appointment has been made

## 2020-05-19 ENCOUNTER — Emergency Department (HOSPITAL_COMMUNITY)
Admission: EM | Admit: 2020-05-19 | Discharge: 2020-05-19 | Disposition: A | Discharge status: Home / Self Care | Payer: Medicare Other | Attending: Emergency Medicine | Admitting: Emergency Medicine

## 2020-05-19 ENCOUNTER — Encounter (HOSPITAL_COMMUNITY): Payer: Self-pay | Admitting: Emergency Medicine

## 2020-05-19 ENCOUNTER — Other Ambulatory Visit: Payer: Self-pay

## 2020-05-19 ENCOUNTER — Emergency Department (HOSPITAL_COMMUNITY): Payer: Medicare Other

## 2020-05-19 DIAGNOSIS — Z950 Presence of cardiac pacemaker: Secondary | ICD-10-CM | POA: Diagnosis not present

## 2020-05-19 DIAGNOSIS — W01198A Fall on same level from slipping, tripping and stumbling with subsequent striking against other object, initial encounter: Secondary | ICD-10-CM | POA: Insufficient documentation

## 2020-05-19 DIAGNOSIS — S022XXA Fracture of nasal bones, initial encounter for closed fracture: Secondary | ICD-10-CM | POA: Insufficient documentation

## 2020-05-19 DIAGNOSIS — S0992XA Unspecified injury of nose, initial encounter: Secondary | ICD-10-CM | POA: Diagnosis present

## 2020-05-19 DIAGNOSIS — S61412A Laceration without foreign body of left hand, initial encounter: Secondary | ICD-10-CM | POA: Diagnosis not present

## 2020-05-19 DIAGNOSIS — Z7901 Long term (current) use of anticoagulants: Secondary | ICD-10-CM | POA: Diagnosis not present

## 2020-05-19 DIAGNOSIS — E119 Type 2 diabetes mellitus without complications: Secondary | ICD-10-CM | POA: Diagnosis not present

## 2020-05-19 DIAGNOSIS — Z79899 Other long term (current) drug therapy: Secondary | ICD-10-CM | POA: Insufficient documentation

## 2020-05-19 DIAGNOSIS — I10 Essential (primary) hypertension: Secondary | ICD-10-CM | POA: Insufficient documentation

## 2020-05-19 DIAGNOSIS — S0121XA Laceration without foreign body of nose, initial encounter: Secondary | ICD-10-CM

## 2020-05-19 DIAGNOSIS — Z23 Encounter for immunization: Secondary | ICD-10-CM | POA: Diagnosis not present

## 2020-05-19 MED ORDER — BACITRACIN ZINC 500 UNIT/GM EX OINT: 1.0000 "application " | TOPICAL_OINTMENT | Freq: Two times a day (BID) | CUTANEOUS | 0 refills | Status: AC

## 2020-05-19 MED ORDER — DOUBLE ANTIBIOTIC 500-10000 UNIT/GM EX OINT
TOPICAL_OINTMENT | Freq: Once | CUTANEOUS | Status: AC
  Administered 2020-05-19: 1 via TOPICAL
  Filled 2020-05-19: qty 1

## 2020-05-19 MED ORDER — LIDOCAINE-EPINEPHRINE (PF) 2 %-1:200000 IJ SOLN
INTRAMUSCULAR | Status: AC
  Administered 2020-05-19: 10 mL
  Filled 2020-05-19: qty 20

## 2020-05-19 MED ORDER — LIDOCAINE-EPINEPHRINE 2 %-1:200000 IJ SOLN
10.0000 mL | Freq: Once | INTRAMUSCULAR | Status: AC
Start: 2020-05-19 — End: 2020-05-19

## 2020-05-19 MED ORDER — TETANUS-DIPHTH-ACELL PERTUSSIS 5-2.5-18.5 LF-MCG/0.5 IM SUSY
0.5000 mL | PREFILLED_SYRINGE | Freq: Once | INTRAMUSCULAR | Status: AC
  Administered 2020-05-19: 0.5 mL via INTRAMUSCULAR
  Filled 2020-05-19: qty 0.5

## 2020-05-19 MED ORDER — LIDOCAINE-EPINEPHRINE-TETRACAINE (LET) TOPICAL GEL
3.0000 mL | Freq: Once | TOPICAL | Status: AC
  Administered 2020-05-19: 3 mL via TOPICAL
  Filled 2020-05-19: qty 3

## 2020-05-19 NOTE — ED Provider Notes (Signed)
[facility] EMERGENCY DEPARTMENT Provider Note   CSN: [id number] Arrival date & time: 05/19/20  4332     History Chief Complaint  Patient presents with  . Fall    Kerry Carlson is a 87 y.o. female.  HPI 87 year old female presents after a slip and fall.  She was try to get out of her bed and slipped falling face first.  Daughter states she seemed a little disoriented at first but is better.  She hit her face and scraped her hand and right knee.  Unknown last tetanus immunization.  Patient feels like she is breathing okay and denies any headache or neck pain.  She is on Xarelto.  Her hand and knee do not hurt to though have some abrasion/skin tears. She was able to ambulate after the injury.  Past Medical History:  Diagnosis Date  . Diabetes mellitus without complication (HCC)    Type 2  . Hyperlipidemia   . Hypertension   . Thyroid disease    Hypothyroid    There are no problems to display for this patient.   Past Surgical History:  Procedure Laterality Date  . ABDOMINAL HYSTERECTOMY    . PACEMAKER INSERTION  10/11/2016   Medtronic Azure MRI single-chamber pacemaker in [place]     OB History   No obstetric history on file.     Family History  Problem Relation Age of Onset  . Stroke Mother   . Stroke Father     Social History   Tobacco Use  . Smoking status: Never Smoker  . Smokeless tobacco: Never Used  Vaping Use  . Vaping Use: Never used  Substance Use Topics  . Alcohol use: No  . Drug use: Never    Home Medications Prior to Admission medications   Medication Sig Start Date End Date Taking? Authorizing Provider  bacitracin ointment Apply 1 application topically 2 (two) times daily for 5 days. 05/19/20 05/24/20 Yes Pricilla Loveless, MD  acetaminophen (TYLENOL) 500 MG tablet Take 1,000 mg by mouth every 6 (six) hours as needed.    [provider]  atenolol (TENORMIN) 100 MG tablet Take 1 tablet (100 mg total) by mouth daily. 03/16/20   Yates Decamp,  MD  famotidine (PEPCID) 20 MG tablet  10/08/19   [provider]  levothyroxine (SYNTHROID) 100 MCG tablet Take 100 mcg by mouth daily before breakfast.     [provider]  meclizine (ANTIVERT) 25 MG tablet Take 25 mg by mouth 3 (three) times daily as needed for dizziness.    [provider]  pravastatin (PRAVACHOL) 10 MG tablet Take 10 mg by mouth daily.    [provider]  Rivaroxaban (XARELTO) 15 MG TABS tablet Take 15 mg by mouth daily with supper.    [provider]  spironolactone (ALDACTONE) 25 MG tablet Take 25 mg by mouth daily.    [provider]    Allergies    Aleve [naproxen], Aspirin, and Penicillins  Review of Systems   Review of Systems  HENT: Positive for facial swelling and nosebleeds.   Musculoskeletal: Negative for neck pain.  Skin: Positive for wound.  Neurological: Negative for weakness, numbness and headaches.  All other systems reviewed and are negative.   Physical Exam Updated Vital Signs BP 133/72   Pulse 74   Resp 16   Ht 5\' 5"  (1.651 m)   Wt 69.3 kg   SpO2 98%   BMI 25.42 kg/m   Physical Exam Vitals and nursing note reviewed.  Constitutional:      Appearance: She is well-developed and well-nourished.  HENT:     Head: Normocephalic.     Right Ear: External ear normal.     Left Ear: External ear normal.     Nose:     Right Nostril: Epistaxis present.      Comments: Nasal abrasion Inferior to nose there is a superficial laceration    Mouth/Throat:     Comments: No obvious intra-oral injury Eyes:     General:        Right eye: No discharge.        Left eye: No discharge.  Cardiovascular:     Rate and Rhythm: Normal rate and regular rhythm.     Pulses:          Radial pulses are 2+ on the left side.       Dorsalis pedis pulses are 2+ on the right side.     Heart sounds: Normal heart sounds.  Pulmonary:     Effort: Pulmonary effort is normal.     Breath sounds: Normal breath sounds.   Abdominal:     Palpations: Abdomen is soft.     Tenderness: There is no abdominal tenderness.  Musculoskeletal:     Left wrist: No tenderness. Normal range of motion.     Left hand: Laceration present. No tenderness.       Arms:     Right knee: Normal range of motion. No tenderness.       Legs:     Comments: Small skin tear at 5th MCP  Skin:    General: Skin is warm and dry.  Neurological:     Mental Status: She is alert.  Psychiatric:        Mood and Affect: Mood is not anxious.     ED Results / Procedures / Treatments   Labs (all labs ordered are listed, but only abnormal results are displayed) Labs Reviewed - No data to display  EKG None  Radiology CT Head Wo Contrast  Result Date: 05/19/2020 CLINICAL DATA:  Head trauma and facial trauma.  Fall today EXAM: CT HEAD WITHOUT CONTRAST CT MAXILLOFACIAL WITHOUT CONTRAST TECHNIQUE: Multidetector CT imaging of the head and maxillofacial structures were performed using the standard protocol without intravenous contrast. Multiplanar CT image reconstructions of the maxillofacial structures were also generated. COMPARISON:  CT head 02/03/2020 FINDINGS: CT HEAD FINDINGS Brain: Generalized atrophy and mild white matter changes. Negative for acute infarct, hemorrhage, mass. Vascular: Negative for hyperdense vessel Skull: Negative Other: None CT MAXILLOFACIAL FINDINGS Osseous: Nasal bone fracture with minimal displacement to the right. Nondisplaced fracture nasal septum. Orbits: Negative for orbital fracture. Sinuses: Mucosal edema and air-fluid level in the maxillary sinus bilaterally. Fluid is hyperdense bilaterally. Remaining sinuses clear. Soft tissues: No significant soft tissue swelling or mass. Negative orbit bilaterally. IMPRESSION: 1. No acute intracranial abnormality 2. Nasal bone fracture. Nasal septum fracture. Negative for orbital fracture 3. Air-fluid levels in both maxillary sinuses. This is relatively hyperdense and likely is  blood. Electronically Signed   By: Marlan Palau M.D.   On: 05/19/2020 08:56   CT Maxillofacial Wo Contrast  Result Date: 05/19/2020 CLINICAL DATA:  Head trauma and facial trauma.  Fall today EXAM: CT HEAD WITHOUT CONTRAST CT MAXILLOFACIAL WITHOUT CONTRAST TECHNIQUE: Multidetector CT imaging of the head and maxillofacial structures were performed using the standard protocol without intravenous contrast. Multiplanar CT image reconstructions of the maxillofacial structures were also generated. COMPARISON:  CT head 02/03/2020 FINDINGS: CT  HEAD FINDINGS Brain: Generalized atrophy and mild white matter changes. Negative for acute infarct, hemorrhage, mass. Vascular: Negative for hyperdense vessel Skull: Negative Other: None CT MAXILLOFACIAL FINDINGS Osseous: Nasal bone fracture with minimal displacement to the right. Nondisplaced fracture nasal septum. Orbits: Negative for orbital fracture. Sinuses: Mucosal edema and air-fluid level in the maxillary sinus bilaterally. Fluid is hyperdense bilaterally. Remaining sinuses clear. Soft tissues: No significant soft tissue swelling or mass. Negative orbit bilaterally. IMPRESSION: 1. No acute intracranial abnormality 2. Nasal bone fracture. Nasal septum fracture. Negative for orbital fracture 3. Air-fluid levels in both maxillary sinuses. This is relatively hyperdense and likely is blood. Electronically Signed   By: Marlan Palau M.D.   On: 05/19/2020 08:56    Procedures .Marland KitchenLaceration Repair  Date/Time: 05/19/2020 9:25 AM Performed by: Pricilla Loveless, MD Authorized by: Pricilla Loveless, MD   Consent:    Consent obtained:  Verbal   Consent given by:  Patient Anesthesia:    Anesthesia method:  Local infiltration and topical application   Topical anesthetic:  LET   Local anesthetic:  Lidocaine 2% WITH epi Laceration details:    Location:  Face   Face location:  Nose   Length (cm):  1.5 Pre-procedure details:    Preparation:  Patient was prepped and draped  in usual sterile fashion and imaging obtained to evaluate for foreign bodies Exploration:    Limited defect created (wound extended): no     Hemostasis achieved with:  Direct pressure   Imaging outcome: foreign body not noted     Contaminated: no   Treatment:    Area cleansed with:  Saline   Amount of cleaning:  Standard   Irrigation solution:  Sterile saline   Irrigation method:  Syringe   Debridement:  None   Undermining:  None Skin repair:    Repair method:  Sutures   Suture size:  5-0   Suture material:  Fast-absorbing gut   Suture technique:  Simple interrupted   Number of sutures:  3 Approximation:    Approximation:  Close Repair type:    Repair type:  Intermediate Post-procedure details:    Dressing:  Antibiotic ointment   Procedure completion:  Tolerated well, no immediate complications     Medications Ordered in ED Medications  lidocaine-EPINEPHrine (XYLOCAINE W/EPI) 2 %-1:200000 (PF) injection 10 mL (has no administration in time range)  polymixin-bacitracin (POLYSPORIN) ointment (has no administration in time range)  Tdap (BOOSTRIX) injection 0.5 mL (0.5 mLs Intramuscular Given 05/19/20 0804)  lidocaine-EPINEPHrine-tetracaine (LET) topical gel (3 mLs Topical Given 05/19/20 1610)    ED Course  I have reviewed the triage vital signs and the nursing notes.  Pertinent labs & imaging results that were available during my care of the patient were reviewed by me and considered in my medical decision making (see chart for details).    MDM Rules/Calculators/A&P                          Patient presents after a fall out of bed. This is a recurring problem according to daughter. Her tetanus was updated. She has a nasal abrasion and just inferior to her nose and going into her nose she has a superficial laceration. This was repaired as above. The hand skin tears were Steri-Stripped by the nurse. Head CT and face CT have been personally reviewed. She does have a nasal bone  fracture. There is no obvious septal hematoma on exam. She declines anything for pain. She  appears stable for discharge home with ENT follow-up. Final Clinical Impression(s) / ED Diagnoses Final diagnoses:  Closed fracture of nasal bone, initial encounter  Laceration of nose, initial encounter  Skin tear of left hand without complication, initial encounter    Rx / DC Orders ED Discharge Orders         Ordered    bacitracin ointment  2 times daily        05/19/20 0929           Pricilla Loveless, MD 05/19/20 [phone]

## 2020-05-19 NOTE — ED Triage Notes (Signed)
Pt reports standing up from her bed and her feet sliding out from underneath her. Pt reports falling face first onto hardwood floors. Pt reports she does take eliquis.

## 2020-05-19 NOTE — Discharge Instructions (Signed)
If you develop new or worsening headache, vomiting, confusion, or any other new/concerning symptoms then return to the ER for evaluation of head injury.  You had 3 sutures placed just below your nose and these will dissolve on their own.  Do not blow your nose.  Follow-up with the ear nose throat specialist in about 1 week.

## 2020-05-23 ENCOUNTER — Ambulatory Visit: Payer: Medicare Other | Admitting: Cardiology

## 2020-05-23 ENCOUNTER — Other Ambulatory Visit: Payer: Self-pay

## 2020-05-23 ENCOUNTER — Encounter: Payer: Self-pay | Admitting: Cardiology

## 2020-05-23 VITALS — BP 90/62 | HR 83 | Temp 98.2°F | Resp 16 | Ht 65.0 in | Wt 157.2 lb

## 2020-05-23 DIAGNOSIS — Z950 Presence of cardiac pacemaker: Secondary | ICD-10-CM | POA: Insufficient documentation

## 2020-05-23 DIAGNOSIS — I4821 Permanent atrial fibrillation: Secondary | ICD-10-CM | POA: Insufficient documentation

## 2020-05-23 DIAGNOSIS — I441 Atrioventricular block, second degree: Secondary | ICD-10-CM | POA: Insufficient documentation

## 2020-05-23 DIAGNOSIS — Z45018 Encounter for adjustment and management of other part of cardiac pacemaker: Secondary | ICD-10-CM

## 2020-05-23 HISTORY — DX: Atrioventricular block, second degree: I44.1

## 2020-05-23 HISTORY — DX: Presence of cardiac pacemaker: Z95.0

## 2020-05-23 HISTORY — DX: Encounter for adjustment and management of other part of cardiac pacemaker: Z45.018

## 2020-05-23 NOTE — Progress Notes (Signed)
Primary Physician/Referring:  Gaspar Garbe, MD  Patient ID: Kerry Carlson, female    DOB: 1925/12/06, 87 y.o.   MRN: [id number]  Chief Complaint  Patient presents with  . Hypertension  . Pacemaker Check  . Follow-up    6 month   HPI:    Kerry Carlson  is a 87 y.o. permanent atrial fibrillation, hypertension, diabetes with diabetic polyneuropathy, hyperlipidemia and has a MRI compatible Medtronic single-chamber pacemaker implanted 07/20/2016 while in [place] when she presented with marked bradycardia, AV block and syncope leading to pacemaker implantation.  Past medical history significant for hypertension, hyperlipidemia, controlled diabetes mellitus and hypothyroidism.  Patient had moved back to [place] and then eventually moved to [place] now she is back in [place] with her son, I had seen her 6 months ago, she now presents for pacemaker check and follow-up of hypertension.  She is tolerating all medications well, recently had a fall and had to be taken to [facility] and received sutures on her nose.  She has not had any syncope, denies chest pain or dyspnea.  Past Medical History:  Diagnosis Date  . Diabetes mellitus without complication (HCC)    Type 2  . Encounter for care of pacemaker 05/23/2020  . Hyperlipidemia   . Hypertension   . Pacemaker - Medtronic Azure single chamber pacemaker 07/12/2016. 05/23/2020  . Second degree Mobitz II AV block 05/23/2020  . Thyroid disease    Hypothyroid   Past Surgical History:  Procedure Laterality Date  . ABDOMINAL HYSTERECTOMY    . PACEMAKER INSERTION  10/11/2016   Medtronic Azure MRI single-chamber pacemaker in [facility]   Family History  Problem Relation Age of Onset  . Stroke Mother   . Stroke Father     Social History   Tobacco Use  . Smoking status: Never Smoker  . Smokeless tobacco: Never Used  Substance Use Topics  . Alcohol use: No   Marital Status: Widowed  ROS  Review of Systems  Cardiovascular:  Negative for chest pain, dyspnea on exertion and leg swelling.  Musculoskeletal: Positive for arthritis and back pain. Negative for falls.  Gastrointestinal: Negative for melena.  Neurological: Positive for dizziness.   Objective  Blood pressure 90/62, pulse 83, temperature 98.2 F (36.8 C), temperature source Temporal, resp. rate 16, height 5\' 5"  (1.651 m), weight 157 lb 3.2 oz (71.3 kg), SpO2 96 %.  Vitals with BMI 05/23/2020 05/19/2020 05/19/2020  Height 5\' 5"  - -  Weight 157 lbs 3 oz - -  BMI 26.16 - -  Systolic 90 131 -  Diastolic 62 71 -  Pulse 83 75 74     Physical Exam Cardiovascular:     Rate and Rhythm: Normal rate and regular rhythm.     Pulses: Intact distal pulses.     Heart sounds: Normal heart sounds. No murmur heard. No gallop.      Comments: No leg edema, no JVD. Pulmonary:     Effort: Pulmonary effort is normal.     Breath sounds: Normal breath sounds.  Abdominal:     General: Bowel sounds are normal.     Palpations: Abdomen is soft.    Laboratory examination:   CMP Latest Ref Rng & Units 08/21/2016  Glucose 65 - 99 mg/dL )  BUN 6 - 20 mg/dL 13  Creatinine 10/21/2016 - 937(D mg/dL 4.28  Sodium 7.68 - 1.15 mmol/L 141  Potassium 3.5 - 5.1 mmol/L 3.8  Chloride 101 - 111 mmol/L 106  CO2  22 - 32 mmol/L 27  Calcium 8.9 - 10.3 mg/dL 9.1   CBC Latest Ref Rng & Units 08/21/2016  WBC 4.0 - 10.5 K/uL 5.9  Hemoglobin 12.0 - 15.0 g/dL 11.0(L)  Hematocrit 36.0 - 46.0 % 33.6(L)  Platelets 150 - 400 K/uL 239    External labs:   Cholesterol, total 129.000 m 10/08/2019 HDL 44 MG/DL 1/61/0960 LDL 45.409 mg 10/08/2019 Triglycerides 86.000 10/08/2019  A1C 5.9% % 10/08/2019 TSH 0.820 10/08/2019   Hemoglobin 13.400 g/d 10/08/2019  Creatinine, Serum 1.600 mg/ 10/08/2019 Potassium 3.800 08/21/2016 Magnesium N/D ALT (SGPT) 6.000 units 10/08/2019  Medications and allergies   Allergies  Allergen Reactions  . Aleve [Naproxen] Hives  . Aspirin Nausea And Vomiting  .  Penicillins Hives    Had a really long time ago, caused hives Has patient had a PCN reaction causing immediate rash, facial/tongue/throat swelling, SOB or lightheadedness with hypotension: unknown Has patient had a PCN reaction causing severe rash involving mucus membranes or skin necrosis: unknown Has patient had a PCN reaction that required hospitalization unknown Has patient had a PCN reaction occurring within the last 10 years:no If all of the above answers are "NO", then may proceed with Cephalosporin use.     Current Outpatient Medications  Medication Instructions  . acetaminophen (TYLENOL) 1,000 mg, Oral, Every 6 hours PRN  . atenolol (TENORMIN) 100 mg, Oral, Daily  . bacitracin ointment 1 application, Topical, 2 times daily  . famotidine (PEPCID) 20 MG tablet No dose, route, or frequency recorded.  Marland Kitchen levothyroxine (SYNTHROID) 100 mcg, Oral, Daily before breakfast  . meclizine (ANTIVERT) 25 mg, Oral, Daily PRN  . pravastatin (PRAVACHOL) 10 mg, Oral, Daily  . Rivaroxaban (XARELTO) 15 mg, Oral, Daily with supper    Device Clinic: Medtronic Azure MRI single chamber pacemaker 07/12/2016   Scheduled  In office pacemaker check 05/23/20  Single (S)/Dual (D)/BV: S.  Presenting VVIR - 70  AF, VS @ 68/min. Pacemaker dependant:  No. Underlying VS. AP NA, VP 58%.. AMS Episodes 0.. HVR 0. Longevity 10.7 Years. Magnet rate: >85%. Lead measurements: Stable.Marland Kitchen Histogram: Low (L)/normal (N)/high (H)  Normal. Patient activity Low <1 hour/day.   Observations: Normal pacemaker function. Changes: Reduce resting Ventricular rate to VVIR 60/min.    Cardiac Studies:   Echocardiogram done in [place] 6 months ago, report not available.  EKG  EKG 11/06/2018: Probably atrial fibrillation and ventricularly paced rhythm.  No further analysis.  Assessment     ICD-10-CM   1. Encounter for care of pacemaker  Z45.018   2. Pacemaker - Medtronic Azure single chamber pacemaker 07/12/2016.  Z95.0   3.  Second degree Mobitz II AV block  I44.1   4. Permanent atrial fibrillation (HCC)  I48.21     Medications Discontinued During This Encounter  Medication Reason  . spironolactone (ALDACTONE) 25 MG tablet Discontinued by provider   LOW BP  CHA2DS2-VASc Score is 5.  Yearly risk of stroke: 7.2% (F, A, HTN, DM).  Score of 1=0.6; 2=2.2; 3=3.2; 4=4.8; 5=7.2; 6=9.8; 7=>9.8) -(CHF; HTN; vasc disease DM,  Female = 1; Age <65 =0; 65-74 = 1,  >75 =2; stroke/embolism= 2).    Recommendations:   Kerry Carlson  is a 87 y.o. permanent atrial fibrillation, hypertension, diabetes with diabetic polyneuropathy, diabetic nephropathy stage III, no diet controlled diabetes, hyperlipidemia and has a MRI compatible Medtronic single-chamber pacemaker implanted 07/20/2016 while in [place]. She presented with marked bradycardia and syncope leading to pacemaker implantation.  Past medical history significant for hypertension,  hyperlipidemia, controlled diabetes mellitus and hypothyroidism.  Patient has moved from [place] to [place] no complaints.  To be closer to her son.  Pacemaker interrogation reveals normally functioning pacemaker.  I have reduced baseline heart rate to 60 bpm as she has underlying ventricular escape to reduce pacing.  Blood pressure is soft, she has recently had a fall when she missed her step, today the blood pressure is very soft and borderline, will discontinue spironolactone.  Continue high-dose atenolol that she is on, probably started due to atrial fibrillation with RVR.  She is tolerating anticoagulation well without bleeding diathesis.  Renal function and CBC has remained stable.  I will see her back on annual basis.  She needs a new home monitoring device and will be set up. Will continue to monitor her pacemaker remotely.   Yates Decamp, MD, [facility] 05/23/2020, 8:41 PM Office: [phone]

## 2020-08-31 ENCOUNTER — Other Ambulatory Visit: Payer: Self-pay

## 2020-08-31 ENCOUNTER — Ambulatory Visit: Admission: EM | Admit: 2020-08-31 | Discharge: 2020-08-31 | Payer: Medicare Other

## 2020-08-31 ENCOUNTER — Emergency Department (HOSPITAL_COMMUNITY)
Admission: EM | Admit: 2020-08-31 | Discharge: 2020-08-31 | Disposition: A | Discharge status: Home / Self Care | Payer: Medicare Other | Attending: Emergency Medicine | Admitting: Emergency Medicine

## 2020-08-31 ENCOUNTER — Encounter (HOSPITAL_COMMUNITY): Payer: Self-pay

## 2020-08-31 ENCOUNTER — Encounter: Payer: Self-pay | Admitting: Emergency Medicine

## 2020-08-31 DIAGNOSIS — W01198A Fall on same level from slipping, tripping and stumbling with subsequent striking against other object, initial encounter: Secondary | ICD-10-CM | POA: Diagnosis not present

## 2020-08-31 DIAGNOSIS — I4821 Permanent atrial fibrillation: Secondary | ICD-10-CM | POA: Insufficient documentation

## 2020-08-31 DIAGNOSIS — E039 Hypothyroidism, unspecified: Secondary | ICD-10-CM | POA: Insufficient documentation

## 2020-08-31 DIAGNOSIS — Z950 Presence of cardiac pacemaker: Secondary | ICD-10-CM | POA: Diagnosis not present

## 2020-08-31 DIAGNOSIS — I10 Essential (primary) hypertension: Secondary | ICD-10-CM | POA: Insufficient documentation

## 2020-08-31 DIAGNOSIS — Y92512 Supermarket, store or market as the place of occurrence of the external cause: Secondary | ICD-10-CM | POA: Insufficient documentation

## 2020-08-31 DIAGNOSIS — Z7901 Long term (current) use of anticoagulants: Secondary | ICD-10-CM | POA: Diagnosis not present

## 2020-08-31 DIAGNOSIS — S0990XA Unspecified injury of head, initial encounter: Secondary | ICD-10-CM

## 2020-08-31 DIAGNOSIS — S0181XA Laceration without foreign body of other part of head, initial encounter: Secondary | ICD-10-CM

## 2020-08-31 DIAGNOSIS — E119 Type 2 diabetes mellitus without complications: Secondary | ICD-10-CM | POA: Diagnosis not present

## 2020-08-31 DIAGNOSIS — S01511A Laceration without foreign body of lip, initial encounter: Secondary | ICD-10-CM | POA: Insufficient documentation

## 2020-08-31 DIAGNOSIS — Z79899 Other long term (current) drug therapy: Secondary | ICD-10-CM | POA: Insufficient documentation

## 2020-08-31 DIAGNOSIS — S0993XA Unspecified injury of face, initial encounter: Secondary | ICD-10-CM | POA: Diagnosis present

## 2020-08-31 MED ORDER — LIDOCAINE-EPINEPHRINE (PF) 1 %-1:200000 IJ SOLN: 10.0000 mL | Freq: Once | INTRAMUSCULAR | Status: DC

## 2020-08-31 MED ORDER — LIDOCAINE-EPINEPHRINE (PF) 2 %-1:200000 IJ SOLN
INTRAMUSCULAR | Status: AC
  Filled 2020-08-31: qty 20

## 2020-08-31 NOTE — Discharge Instructions (Addendum)
Keep the wounds clean.  Use ice on sore spots today and tomorrow.  Return here or see your doctor for problems.  Watch for signs of intracranial bleeding or increasing pain.

## 2020-08-31 NOTE — ED Notes (Signed)
Provider examined patient and advised that patient needs to go to the ED.  Unsure if laceration goes all the way through lip and patient is currently taking blood thinners.

## 2020-08-31 NOTE — ED Triage Notes (Signed)
Fell today at [employer]. Upper lip laceration on outside and inside.  Cannot determine if cut goes all the way through. Bruising noted to right side of face and fingers.  Patient is currently taking blood thinners.

## 2020-08-31 NOTE — ED Triage Notes (Signed)
Pt to er, family with pt, states that she caught her foot on the shopping cart and fell into the hand sanitizer dispenser, pt has lac to her upper lip, pt was seen at urgent care and told to come to the er because she is on blood thinners and they were concerned about her lip lac.

## 2020-08-31 NOTE — ED Provider Notes (Signed)
[facility] EMERGENCY DEPARTMENT Provider Note   CSN: [id number] Arrival date & time: 08/31/20  1805     History Chief Complaint  Patient presents with  . Fall    Kerry Carlson is a 87 y.o. female.  HPI Patient here for evaluation of a lip injury that occurred when she tripped on a cart at the drugstore, falling forward striking her lip.  She did not get up immediately until assisted by EMS.  She was unable to walk and came in by private vehicle with her son.  She thinks her tetanus status is up-to-date.  She denies headache, neck pain, back pain, nausea, vomiting, weakness or dizziness.  She states she only came here to see if her wound needed to be sutured.  She is on Xarelto.  No recent illnesses.  There are no other known modifying factors.    Past Medical History:  Diagnosis Date  . Diabetes mellitus without complication (HCC)    Type 2  . Encounter for care of pacemaker 05/23/2020  . Hyperlipidemia   . Hypertension   . Pacemaker - Medtronic Azure single chamber pacemaker 07/12/2016. 05/23/2020  . Second degree Mobitz II AV block 05/23/2020  . Thyroid disease    Hypothyroid    Patient Active Problem List   Diagnosis Date Noted  . Encounter for care of pacemaker 05/23/2020  . Pacemaker - Medtronic Azure single chamber pacemaker 07/12/2016. 05/23/2020  . Second degree Mobitz II AV block 05/23/2020  . Permanent atrial fibrillation (HCC) 05/23/2020    Past Surgical History:  Procedure Laterality Date  . ABDOMINAL HYSTERECTOMY    . PACEMAKER INSERTION  10/11/2016   Medtronic Azure MRI single-chamber pacemaker in [place]     OB History   No obstetric history on file.     Family History  Problem Relation Age of Onset  . Stroke Mother   . Stroke Father     Social History   Tobacco Use  . Smoking status: Never Smoker  . Smokeless tobacco: Never Used  Vaping Use  . Vaping Use: Never used  Substance Use Topics  . Alcohol use: No  . Drug use: Never    Home  Medications Prior to Admission medications   Medication Sig Start Date End Date Taking? Authorizing Provider  acetaminophen (TYLENOL) 500 MG tablet Take 1,000 mg by mouth every 6 (six) hours as needed.    [provider]  atenolol (TENORMIN) 100 MG tablet Take 1 tablet (100 mg total) by mouth daily. Patient taking differently: Take 100 mg by mouth 2 (two) times daily. 03/16/20   Yates Decamp, MD  famotidine (PEPCID) 20 MG tablet  10/08/19   [provider]  levothyroxine (SYNTHROID) 100 MCG tablet Take 100 mcg by mouth daily before breakfast.     [provider]  meclizine (ANTIVERT) 25 MG tablet Take 25 mg by mouth daily as needed for dizziness.    [provider]  pravastatin (PRAVACHOL) 10 MG tablet Take 10 mg by mouth daily.    [provider]  Rivaroxaban (XARELTO) 15 MG TABS tablet Take 15 mg by mouth daily with supper.    [provider]    Allergies    Aleve [naproxen], Aspirin, and Penicillins  Review of Systems   Review of Systems  All other systems reviewed and are negative.   Physical Exam Updated Vital Signs BP (!) 174/90   Pulse 89   Temp 98.1 F (36.7 C) (Oral)   Resp 18   Ht  5\' 4"  (1.626 m)   Wt 67.1 kg   SpO2 99%   BMI 25.40 kg/m   Physical Exam Vitals and nursing note reviewed.  Constitutional:      General: She is not in acute distress.    Appearance: She is well-developed. She is not ill-appearing, toxic-appearing or diaphoretic.  HENT:     Head: Normocephalic.     Comments: Contusion upper lip, with laceration on outer skin, and maceration of mucosal tissue beneath.  She is wearing upper and lower dentures, they do not appear to be injured.  No trismus.  No tenderness swelling, abrasion, contusion of scalp or cranium.  Contusion right cheek with mild swelling and ecchymosis, no deformity or crepitation.    Right Ear: External ear normal.     Left Ear: External ear normal.  Eyes:      Conjunctiva/sclera: Conjunctivae normal.     Pupils: Pupils are equal, round, and reactive to light.  Neck:     Trachea: Phonation normal.  Cardiovascular:     Rate and Rhythm: Normal rate and regular rhythm.     Heart sounds: Normal heart sounds.  Pulmonary:     Effort: Pulmonary effort is normal.     Breath sounds: Normal breath sounds.  Abdominal:     Palpations: Abdomen is soft.     Tenderness: There is no abdominal tenderness.  Musculoskeletal:        General: Normal range of motion.     Cervical back: Normal range of motion and neck supple.     Comments: Normal range of motion, neck, back, arms and legs.  Skin:    General: Skin is warm and dry.  Neurological:     Mental Status: She is alert and oriented to person, place, and time.     Cranial Nerves: No cranial nerve deficit.     Sensory: No sensory deficit.     Motor: No abnormal muscle tone.     Coordination: Coordination normal.  Psychiatric:        Mood and Affect: Mood normal.        Behavior: Behavior normal.        Thought Content: Thought content normal.        Judgment: Judgment normal.     ED Results / Procedures / Treatments   Labs (all labs ordered are listed, but only abnormal results are displayed) Labs Reviewed - No data to display  EKG None  Radiology No results found.  Procedures .Marland Kitchen.Laceration Repair  Date/Time: 08/31/2020 10:04 PM Performed by: Mancel BaleWentz, Kaleeya Hancock, MD Authorized by: Mancel BaleWentz, Lexani Corona, MD   Consent:    Consent obtained:  Verbal   Consent given by:  Patient   Risks discussed:  Infection, pain and poor cosmetic result Universal protocol:    Procedure explained and questions answered to patient or proxy's satisfaction: yes     Immediately prior to procedure, a time out was called: yes     Patient identity confirmed:  Verbally with patient Anesthesia:    Anesthesia method:  Local infiltration   Local anesthetic:  Lidocaine 2% WITH epi Laceration details:    Location:  Lip   Lip  location:  Upper exterior lip   Length (cm):  2.2   Depth (mm):  8 Pre-procedure details:    Preparation:  Patient was prepped and draped in usual sterile fashion Exploration:    Limited defect created (wound extended): no     Hemostasis achieved with:  Direct pressure   Wound exploration: wound  explored through full range of motion     Wound extent: no areolar tissue violation noted, no fascia violation noted, no foreign bodies/material noted, no muscle damage noted, no underlying fracture noted and no vascular damage noted     Contaminated: no   Treatment:    Area cleansed with:  Chlorhexidine   Amount of cleaning:  Standard   Irrigation solution:  Sterile saline   Debridement:  None   Undermining:  None   Scar revision: no   Skin repair:    Repair method:  Sutures   Suture size:  5-0   Suture material:  Prolene   Suture technique:  Simple interrupted   Number of sutures:  3 Approximation:    Approximation:  Loose   Vermilion border well-aligned: no   Repair type:    Repair type:  Simple Post-procedure details:    Dressing:  Non-adherent dressing   Procedure completion:  Tolerated well, no immediate complications     Medications Ordered in ED Medications  lidocaine-EPINEPHrine (XYLOCAINE-EPINEPHrine) 1 %-1:200000 (PF) injection 10 mL (10 mLs Infiltration Not Given 08/31/20 2127)  lidocaine-EPINEPHrine (XYLOCAINE W/EPI) 2 %-1:200000 (PF) injection (  Given by Other 08/31/20 2126)    ED Course  I have reviewed the triage vital signs and the nursing notes.  Pertinent labs & imaging results that were available during my care of the patient were reviewed by me and considered in my medical decision making (see chart for details).  Clinical Course as of 08/31/20 2210  Wed Aug 31, 2020  2101 Discussed possible imaging with patient and her son, relative to possible intracranial injury, while on anticoagulants.  They both prefer to watch expectantly knowing that there is a risk  of bleeding.  Patient son states his wife who is a Publishing rights manager will watch the patient closely. [EW]    Clinical Course User Index [EW] Mancel Bale, MD   MDM Rules/Calculators/A&P                           Patient Vitals for the past 24 hrs:  BP Temp Temp src Pulse Resp SpO2 Height Weight  08/31/20 2145 -- -- -- 89 -- 99 % -- --  08/31/20 2130 (!) 174/90 -- -- 91 -- 96 % -- --  08/31/20 2115 -- -- -- 72 -- 96 % -- --  08/31/20 2100 (!) 158/92 -- -- 82 18 91 % -- --  08/31/20 1812 (!) 179/111 98.1 F (36.7 C) Oral 80 16 98 % -- --  08/31/20 1812 -- -- -- -- -- -- 5\' 4"  (1.626 m) 67.1 kg    10:06 PM Reevaluation with update and discussion. After initial assessment and treatment, an updated evaluation reveals no change in status, findings discussed and all questions answered.  I again offered CT imaging however patient and son declined.   Medical Decision Making:  This patient is presenting for evaluation of injury to upper lip from fall, which does require a range of treatment options, and is a complaint that involves a moderate risk of morbidity and mortality. The differential diagnoses include contusion, fracture, intracranial injury. I decided to review old records, and in summary elderly female sustained injury to upper lip when she excellently tripped and fell while walking in a drugstore.  I obtained additional historical information from son at bedside.    Critical Interventions-clinical evaluation, discussion with patient and son, offered CT imaging, patient and son declined stating they  think she is at her baseline and that she has had falls before without significant injury.  They were strongly counseled to return for worsening symptoms.  After These Interventions, the Patient was reevaluated and was found stable for discharge.  Laceration has been repaired by me.  Discussion about potential worsening condition, especially related to anticoagulation.   Son states family members will watch her and they will return for problems.  CRITICAL CARE-no Performed by: Mancel Bale  Nursing Notes Reviewed/ Care Coordinated Applicable Imaging Reviewed Interpretation of Laboratory Data incorporated into ED treatment  The patient appears reasonably screened and/or stabilized for discharge and I doubt any other medical condition or other [facility] requiring further screening, evaluation, or treatment in the ED at this time prior to discharge.  Plan: Home Medications-continue usual, Tylenol for pain; Home Treatments-wound care; return here if the recommended treatment, does not improve the symptoms; Recommended follow up-PCP 5 days and as needed     Final Clinical Impression(s) / ED Diagnoses Final diagnoses:  Injury of head, initial encounter  Facial laceration, initial encounter    Rx / DC Orders ED Discharge Orders    None       Mancel Bale, MD 08/31/20 2210

## 2020-08-31 NOTE — ED Notes (Signed)
Pt refused bandaid to lac.

## 2020-08-31 NOTE — ED Notes (Signed)
Report given to Feliz Beam, RN at [facility] ED

## 2021-04-21 ENCOUNTER — Other Ambulatory Visit: Payer: Self-pay

## 2021-04-21 ENCOUNTER — Encounter (HOSPITAL_COMMUNITY): Payer: Self-pay | Admitting: Family Medicine

## 2021-04-21 ENCOUNTER — Inpatient Hospital Stay (HOSPITAL_COMMUNITY): Payer: Medicare Other | Admitting: Anesthesiology

## 2021-04-21 ENCOUNTER — Emergency Department (HOSPITAL_COMMUNITY): Payer: Medicare Other

## 2021-04-21 ENCOUNTER — Inpatient Hospital Stay (HOSPITAL_COMMUNITY)
Admission: EM | Admit: 2021-04-21 | Discharge: 2021-04-26 | DRG: 521 | Disposition: A | Discharge status: Skilled Nursing Facility | Payer: Medicare Other | Attending: Student | Admitting: Student

## 2021-04-21 DIAGNOSIS — N390 Urinary tract infection, site not specified: Secondary | ICD-10-CM | POA: Diagnosis present

## 2021-04-21 DIAGNOSIS — Z823 Family history of stroke: Secondary | ICD-10-CM | POA: Diagnosis not present

## 2021-04-21 DIAGNOSIS — S72011A Unspecified intracapsular fracture of right femur, initial encounter for closed fracture: Secondary | ICD-10-CM | POA: Diagnosis present

## 2021-04-21 DIAGNOSIS — I10 Essential (primary) hypertension: Secondary | ICD-10-CM | POA: Diagnosis present

## 2021-04-21 DIAGNOSIS — D62 Acute posthemorrhagic anemia: Secondary | ICD-10-CM | POA: Diagnosis not present

## 2021-04-21 DIAGNOSIS — R682 Dry mouth, unspecified: Secondary | ICD-10-CM | POA: Diagnosis present

## 2021-04-21 DIAGNOSIS — Z20822 Contact with and (suspected) exposure to covid-19: Secondary | ICD-10-CM | POA: Diagnosis present

## 2021-04-21 DIAGNOSIS — Z79899 Other long term (current) drug therapy: Secondary | ICD-10-CM

## 2021-04-21 DIAGNOSIS — E039 Hypothyroidism, unspecified: Secondary | ICD-10-CM | POA: Diagnosis present

## 2021-04-21 DIAGNOSIS — Z7901 Long term (current) use of anticoagulants: Secondary | ICD-10-CM

## 2021-04-21 DIAGNOSIS — F03A0 Unspecified dementia, mild, without behavioral disturbance, psychotic disturbance, mood disturbance, and anxiety: Secondary | ICD-10-CM | POA: Diagnosis present

## 2021-04-21 DIAGNOSIS — W19XXXA Unspecified fall, initial encounter: Secondary | ICD-10-CM

## 2021-04-21 DIAGNOSIS — B9620 Unspecified Escherichia coli [E. coli] as the cause of diseases classified elsewhere: Secondary | ICD-10-CM | POA: Diagnosis present

## 2021-04-21 DIAGNOSIS — Z886 Allergy status to analgesic agent status: Secondary | ICD-10-CM | POA: Diagnosis not present

## 2021-04-21 DIAGNOSIS — E1165 Type 2 diabetes mellitus with hyperglycemia: Secondary | ICD-10-CM | POA: Diagnosis present

## 2021-04-21 DIAGNOSIS — N1832 Chronic kidney disease, stage 3b: Secondary | ICD-10-CM | POA: Diagnosis present

## 2021-04-21 DIAGNOSIS — Z880 Allergy status to penicillin: Secondary | ICD-10-CM | POA: Diagnosis not present

## 2021-04-21 DIAGNOSIS — Z79890 Hormone replacement therapy (postmenopausal): Secondary | ICD-10-CM | POA: Diagnosis not present

## 2021-04-21 DIAGNOSIS — Z90710 Acquired absence of both cervix and uterus: Secondary | ICD-10-CM

## 2021-04-21 DIAGNOSIS — E079 Disorder of thyroid, unspecified: Secondary | ICD-10-CM | POA: Diagnosis present

## 2021-04-21 DIAGNOSIS — G9341 Metabolic encephalopathy: Secondary | ICD-10-CM | POA: Diagnosis not present

## 2021-04-21 DIAGNOSIS — I4821 Permanent atrial fibrillation: Secondary | ICD-10-CM | POA: Diagnosis present

## 2021-04-21 DIAGNOSIS — Z9181 History of falling: Secondary | ICD-10-CM

## 2021-04-21 DIAGNOSIS — Z950 Presence of cardiac pacemaker: Secondary | ICD-10-CM | POA: Diagnosis present

## 2021-04-21 DIAGNOSIS — N179 Acute kidney failure, unspecified: Secondary | ICD-10-CM | POA: Diagnosis present

## 2021-04-21 DIAGNOSIS — S72031A Displaced midcervical fracture of right femur, initial encounter for closed fracture: Principal | ICD-10-CM | POA: Diagnosis present

## 2021-04-21 DIAGNOSIS — W010XXA Fall on same level from slipping, tripping and stumbling without subsequent striking against object, initial encounter: Secondary | ICD-10-CM | POA: Diagnosis present

## 2021-04-21 DIAGNOSIS — I129 Hypertensive chronic kidney disease with stage 1 through stage 4 chronic kidney disease, or unspecified chronic kidney disease: Secondary | ICD-10-CM | POA: Diagnosis present

## 2021-04-21 DIAGNOSIS — Y92009 Unspecified place in unspecified non-institutional (private) residence as the place of occurrence of the external cause: Secondary | ICD-10-CM

## 2021-04-21 DIAGNOSIS — E785 Hyperlipidemia, unspecified: Secondary | ICD-10-CM | POA: Diagnosis present

## 2021-04-21 DIAGNOSIS — I441 Atrioventricular block, second degree: Secondary | ICD-10-CM | POA: Diagnosis present

## 2021-04-21 DIAGNOSIS — E1122 Type 2 diabetes mellitus with diabetic chronic kidney disease: Secondary | ICD-10-CM | POA: Diagnosis present

## 2021-04-21 DIAGNOSIS — H9190 Unspecified hearing loss, unspecified ear: Secondary | ICD-10-CM | POA: Diagnosis present

## 2021-04-21 DIAGNOSIS — Z419 Encounter for procedure for purposes other than remedying health state, unspecified: Secondary | ICD-10-CM

## 2021-04-21 DIAGNOSIS — E119 Type 2 diabetes mellitus without complications: Secondary | ICD-10-CM

## 2021-04-21 DIAGNOSIS — S72001A Fracture of unspecified part of neck of right femur, initial encounter for closed fracture: Secondary | ICD-10-CM

## 2021-04-21 DIAGNOSIS — Z96641 Presence of right artificial hip joint: Secondary | ICD-10-CM

## 2021-04-21 DIAGNOSIS — Z66 Do not resuscitate: Secondary | ICD-10-CM | POA: Diagnosis present

## 2021-04-21 HISTORY — DX: Unspecified atrial fibrillation: I48.91

## 2021-04-21 LAB — TYPE AND SCREEN
ABO/RH(D): O POS
Antibody Screen: NEGATIVE

## 2021-04-21 LAB — CBC WITH DIFFERENTIAL/PLATELET
Abs Immature Granulocytes: 0.06 10*3/uL (ref 0.00–0.07)
Basophils Absolute: 0 10*3/uL (ref 0.0–0.1)
Basophils Relative: 0 %
Eosinophils Absolute: 0.2 10*3/uL (ref 0.0–0.5)
Eosinophils Relative: 1 %
HCT: 36.9 % (ref 36.0–46.0)
Hemoglobin: 11.7 g/dL — ABNORMAL LOW (ref 12.0–15.0)
Immature Granulocytes: 1 %
Lymphocytes Relative: 13 %
Lymphs Abs: 1.5 10*3/uL (ref 0.7–4.0)
MCH: 30.9 pg (ref 26.0–34.0)
MCHC: 31.7 g/dL (ref 30.0–36.0)
MCV: 97.4 fL (ref 80.0–100.0)
Monocytes Absolute: 0.5 10*3/uL (ref 0.1–1.0)
Monocytes Relative: 4 %
Neutro Abs: 9.4 10*3/uL — ABNORMAL HIGH (ref 1.7–7.7)
Neutrophils Relative %: 81 %
Platelets: 219 10*3/uL (ref 150–400)
RBC: 3.79 MIL/uL — ABNORMAL LOW (ref 3.87–5.11)
RDW: 13.7 % (ref 11.5–15.5)
WBC: 11.6 10*3/uL — ABNORMAL HIGH (ref 4.0–10.5)
nRBC: 0 % (ref 0.0–0.2)

## 2021-04-21 LAB — BASIC METABOLIC PANEL
Anion gap: 6 (ref 5–15)
BUN: 13 mg/dL (ref 8–23)
CO2: 24 mmol/L (ref 22–32)
Calcium: 8.5 mg/dL — ABNORMAL LOW (ref 8.9–10.3)
Chloride: 107 mmol/L (ref 98–111)
Creatinine, Ser: 1.45 mg/dL — ABNORMAL HIGH (ref 0.44–1.00)
GFR, Estimated: 34 mL/min — ABNORMAL LOW (ref >60)
Glucose, Bld: 165 mg/dL — ABNORMAL HIGH (ref 70–99)
Potassium: 4.3 mmol/L (ref 3.5–5.1)
Sodium: 137 mmol/L (ref 135–145)

## 2021-04-21 LAB — GLUCOSE, CAPILLARY: Glucose-Capillary: 150 mg/dL — ABNORMAL HIGH (ref 70–99)

## 2021-04-21 LAB — ABO/RH: ABO/RH(D): O POS

## 2021-04-21 LAB — RESP PANEL BY RT-PCR (FLU A&B, COVID) ARPGX2
Influenza A by PCR: NEGATIVE
Influenza B by PCR: NEGATIVE
SARS Coronavirus 2 by RT PCR: NEGATIVE

## 2021-04-21 LAB — PROTIME-INR
INR: 1.6 — ABNORMAL HIGH (ref 0.8–1.2)
Prothrombin Time: 19.3 seconds — ABNORMAL HIGH (ref 11.4–15.2)

## 2021-04-21 MED ORDER — PRAVASTATIN SODIUM 10 MG PO TABS
10.0000 mg | ORAL_TABLET | Freq: Every day | ORAL | Status: DC
  Administered 2021-04-22 – 2021-04-26 (×5): 10 mg via ORAL
  Filled 2021-04-21 (×5): qty 1

## 2021-04-21 MED ORDER — POLYETHYLENE GLYCOL 3350 17 G PO PACK: 17.0000 g | PACK | Freq: Every day | ORAL | Status: DC | PRN

## 2021-04-21 MED ORDER — DOCUSATE SODIUM 100 MG PO CAPS
100.0000 mg | ORAL_CAPSULE | Freq: Two times a day (BID) | ORAL | Status: DC
  Administered 2021-04-21 – 2021-04-26 (×8): 100 mg via ORAL
  Filled 2021-04-21 (×9): qty 1

## 2021-04-21 MED ORDER — ONDANSETRON HCL 4 MG/2ML IJ SOLN: 4.0000 mg | Freq: Once | INTRAMUSCULAR | Status: DC

## 2021-04-21 MED ORDER — METHOCARBAMOL 500 MG PO TABS: 500.0000 mg | ORAL_TABLET | Freq: Four times a day (QID) | ORAL | Status: DC | PRN

## 2021-04-21 MED ORDER — LEVOTHYROXINE SODIUM 100 MCG PO TABS
100.0000 ug | ORAL_TABLET | Freq: Every day | ORAL | Status: DC
  Administered 2021-04-22 – 2021-04-26 (×4): 100 ug via ORAL
  Filled 2021-04-21 (×5): qty 1

## 2021-04-21 MED ORDER — MORPHINE SULFATE (PF) 2 MG/ML IV SOLN: 2.0000 mg | INTRAVENOUS | Status: DC | PRN

## 2021-04-21 MED ORDER — METHOCARBAMOL 1000 MG/10ML IJ SOLN
500.0000 mg | Freq: Four times a day (QID) | INTRAVENOUS | Status: DC | PRN
  Filled 2021-04-21 (×2): qty 5

## 2021-04-21 MED ORDER — SODIUM CHLORIDE 0.9 % IV SOLN
2.0000 g | Freq: Once | INTRAVENOUS | Status: AC
  Administered 2021-04-21: 06:00:00 2 g via INTRAVENOUS
  Filled 2021-04-21: qty 20

## 2021-04-21 MED ORDER — FENTANYL CITRATE (PF) 100 MCG/2ML IJ SOLN
INTRAMUSCULAR | Status: AC
  Filled 2021-04-21: qty 2

## 2021-04-21 MED ORDER — DEXAMETHASONE SODIUM PHOSPHATE 4 MG/ML IJ SOLN
INTRAMUSCULAR | Status: DC | PRN
  Administered 2021-04-21: 5 mg via PERINEURAL

## 2021-04-21 MED ORDER — SODIUM CHLORIDE 0.9 % IV SOLN
2.0000 g | INTRAVENOUS | Status: DC
  Administered 2021-04-22 – 2021-04-24 (×3): 2 g via INTRAVENOUS
  Filled 2021-04-21 (×3): qty 20

## 2021-04-21 MED ORDER — FENTANYL CITRATE PF 50 MCG/ML IJ SOSY
50.0000 ug | PREFILLED_SYRINGE | INTRAMUSCULAR | Status: AC | PRN
  Administered 2021-04-21 (×2): 50 ug via INTRAVENOUS
  Filled 2021-04-21 (×2): qty 1

## 2021-04-21 MED ORDER — BISACODYL 5 MG PO TBEC: 5.0000 mg | DELAYED_RELEASE_TABLET | Freq: Every day | ORAL | Status: DC | PRN

## 2021-04-21 MED ORDER — OXYCODONE HCL 5 MG PO TABS
5.0000 mg | ORAL_TABLET | ORAL | Status: DC | PRN
  Administered 2021-04-23 (×2): 5 mg via ORAL
  Filled 2021-04-21 (×3): qty 1

## 2021-04-21 MED ORDER — BUPIVACAINE HCL (PF) 0.5 % IJ SOLN
INTRAMUSCULAR | Status: DC | PRN
  Administered 2021-04-21: 30 mL via PERINEURAL

## 2021-04-21 MED ORDER — INSULIN ASPART 100 UNIT/ML IJ SOLN
0.0000 [IU] | Freq: Three times a day (TID) | INTRAMUSCULAR | Status: DC
  Administered 2021-04-21: 19:00:00 1 [IU] via SUBCUTANEOUS
  Administered 2021-04-22 (×2): 2 [IU] via SUBCUTANEOUS
  Administered 2021-04-23: 1 [IU] via SUBCUTANEOUS
  Administered 2021-04-24: 2 [IU] via SUBCUTANEOUS
  Administered 2021-04-25 – 2021-04-26 (×3): 1 [IU] via SUBCUTANEOUS

## 2021-04-21 MED ORDER — FAMOTIDINE 20 MG PO TABS
20.0000 mg | ORAL_TABLET | Freq: Every day | ORAL | Status: DC
  Administered 2021-04-22 – 2021-04-26 (×5): 20 mg via ORAL
  Filled 2021-04-21 (×5): qty 1

## 2021-04-21 MED ORDER — ACETAMINOPHEN 325 MG PO TABS
650.0000 mg | ORAL_TABLET | Freq: Four times a day (QID) | ORAL | Status: DC | PRN
  Administered 2021-04-23 (×2): 650 mg via ORAL
  Filled 2021-04-21 (×2): qty 2

## 2021-04-21 MED ORDER — ATENOLOL 50 MG PO TABS
100.0000 mg | ORAL_TABLET | Freq: Two times a day (BID) | ORAL | Status: DC
  Administered 2021-04-21 – 2021-04-26 (×9): 100 mg via ORAL
  Filled 2021-04-21 (×8): qty 2

## 2021-04-21 MED ORDER — FENTANYL CITRATE (PF) 100 MCG/2ML IJ SOLN
INTRAMUSCULAR | Status: DC | PRN
  Administered 2021-04-21: 50 ug via INTRAVENOUS

## 2021-04-21 MED ORDER — ONDANSETRON HCL 4 MG/2ML IJ SOLN: 4.0000 mg | Freq: Four times a day (QID) | INTRAMUSCULAR | Status: DC | PRN

## 2021-04-21 NOTE — H&P (Addendum)
History and Physical    Patient: Kerry Carlson:[id number] DOB: Sep 23, 1925 DOA: 04/21/2021 DOS: the patient was seen and examined on 04/21/2021 PCP: Tisovec, Adelfa Koh, MD  Patient coming from: Home - lives with son, DIL, granddaughter and her husband; Jackey Loge: Shari Heritage, [phone]  Chief Complaint: Fall  HPI: Kerry Carlson is a 87 y.o. female with medical history significant of DM; HTN; HLD; hypothyroidism; and pacemaker placement presenting with a fall.  Tuesday she went to the doctor for confusion, sleeping a lot, arguing with family - different from her usual.  She was diagnosed with UTI and she was placed on Bactrim but culture results showed org was resistant.  They were supposed to go pick up a new rx today - but they didn't make it there.  Last night, [employer] sent the sheriff and fire dept to the house.  They had not heard anything.  She got up from bed to get her glasses to read.  She fell when she got up.  She has had falls in the past.  She is not complaining of urinary symptoms.  Family thought she was improved 24 hours after starting antibiotics.  She has been confused this AM - ?unrelated.  No fever.    ER Course:  Carryover, per Dr. Antionette Char:  87 yr old lady with atrial fib on Xarelto, symptomatic brady with pacer, HTN, and currently undergoing treatment for UTI who presents with right hip pain after a fall and found to have right hip fracture. ED workup also notable for mild renal insufficiency. UA and urine culture ordered and Rocephin started in ED. Dr. Magnus Ivan of ortho was consulted by ED.    Review of Systems: ROS Past Medical History:  Diagnosis Date   Atrial fibrillation (HCC)    Diabetes mellitus without complication (HCC)    Type 2   Hyperlipidemia    Hypertension    Pacemaker - Medtronic Azure single chamber pacemaker 07/12/2016. 05/23/2020   Second degree Mobitz II AV block 05/23/2020   Thyroid disease    Hypothyroid   Past Surgical History:  Procedure  Laterality Date   ABDOMINAL HYSTERECTOMY     PACEMAKER INSERTION  10/11/2016   Medtronic Azure MRI single-chamber pacemaker in [facility]   Social History:  reports that she has never smoked. She has never used smokeless tobacco. She reports that she does not drink alcohol and does not use drugs.  Allergies  Allergen Reactions   Aleve [Naproxen] Hives   Aspirin Nausea And Vomiting   Penicillins Hives    Had a really long time ago, caused hives Has patient had a PCN reaction causing immediate rash, facial/tongue/throat swelling, SOB or lightheadedness with hypotension: unknown Has patient had a PCN reaction causing severe rash involving mucus membranes or skin necrosis: unknown Has patient had a PCN reaction that required hospitalization unknown Has patient had a PCN reaction occurring within the last 10 years:no If all of the above answers are "NO", then may proceed with Cephalosporin use.    Family History  Problem Relation Age of Onset   Stroke Mother    Stroke Father     Prior to Admission medications   Medication Sig Start Date End Date Taking? Authorizing Provider  atenolol (TENORMIN) 100 MG tablet Take 1 tablet (100 mg total) by mouth daily. Patient taking differently: Take 100 mg by mouth 2 (two) times daily. 03/16/20  Yes  Decamp, MD  famotidine (PEPCID) 20 MG tablet Take 20 mg by mouth daily. 10/08/19  Yes  [provider]  levothyroxine (SYNTHROID) 100 MCG tablet Take 100 mcg by mouth daily before breakfast.    Yes [provider]  meclizine (ANTIVERT) 25 MG tablet Take 25 mg by mouth daily as needed for dizziness.   Yes [provider]  pravastatin (PRAVACHOL) 10 MG tablet Take 10 mg by mouth daily.   Yes [provider]  Rivaroxaban (XARELTO) 15 MG TABS tablet Take 15 mg by mouth daily with supper.   Yes [provider]  sulfamethoxazole-trimethoprim (BACTRIM DS) 800-160 MG tablet Take 1 tablet by mouth 2 (two) times daily. 04/18/21   Yes [provider]  acetaminophen (TYLENOL) 500 MG tablet Take 1,000 mg by mouth every 6 (six) hours as needed. Patient not taking: Reported on 04/21/2021    [provider]    Physical Exam: Vitals:   04/21/21 1500 04/21/21 1530 04/21/21 1545 04/21/21 1630  BP: 117/74 124/82 126/90 122/75  Pulse: 78 83 93 75  Resp: 15 15 17 16   Temp:      TempSrc:      SpO2: 97% 98% 98% 98%   General:  Appears calm and comfortable and is in NAD; hip pain with movement Eyes:  PERRLA, EOMI, normal lids, iris ENT: hard of hearing,  grossly normal lips & tongue, mmm Neck:  no LAD, masses or thyromegaly Cardiovascular:  RRR, no m/r/g. No LE edema.  Respiratory:   CTA bilaterally with no wheezes/rales/rhonchi.  Normal respiratory effort.   Abdomen:  soft, NT, ND Skin:  no rash or induration seen on limited exam Musculoskeletal:  R leg is mildly shortened  Lower extremity:  No LE edema.  Limited foot exam with no ulcerations.  2+ distal pulses. Psychiatric:  grossly normal mood and affect, speech fluent and appropriate, A&O x 3 Neurologic:  CN 2-12 grossly intact, moves all extremities in coordinated fashion other than RLE   Radiological Exams on Admission: Independently reviewed - see discussion in A/P where applicable  DG Chest 1 View  Result Date: 04/21/2021 CLINICAL DATA:  Fall with right hip pain. EXAM: CHEST  1 VIEW COMPARISON:  None. FINDINGS: The heart is moderately enlarged. The central vasculature is normal caliber. There are heavy calcifications in the aortic arch. Left chest single lead cardiac assist device and ventricular wire insertion. The lungs are clear. No pleural effusion is seen. There is eventration of the medial right hemidiaphragm. Osteopenia and mild thoracic degenerative features. No displaced rib fractures. IMPRESSION: Cardiomegaly with no acute chest findings. Electronically Signed   By: 04/23/2021 M.D.   On: 04/21/2021 04:46   DG Hip Unilat With  Pelvis 2-3 Views Left  Addendum Date: 04/21/2021   ADDENDUM REPORT: 04/21/2021 05:44 ADDENDUM: Additional images were subsequently performed on the left hip and demonstrate osteopenia without evidence of fractures and mild features of nonerosive degenerative arthrosis. Electronically Signed   By: 04/23/2021 M.D.   On: 04/21/2021 05:44   Result Date: 04/21/2021 CLINICAL DATA:  04/23/2021 with right hip pain. EXAM: DG HIP (WITH OR WITHOUT PELVIS) 2-3V LEFT COMPARISON:  None. FINDINGS: There is osteopenia with acute transverse subcapital proximal right femoral fracture, with the distal fragment cephalad displaced up to 2 cm and with mild impaction and internal rotation of the distal fragment. Both pelvic rings are intact. The visualized proximal left femur is intact. No further evidence of fractures. No widening of the SI joints and pubic symphysis. There are mild degenerative features at the SI joints and hips. IMPRESSION: Osteopenia with acute subcapital  transverse proximal right femoral fracture with impaction, cephalad displacement and internal rotation of the distal fragment. Electronically Signed: By: Almira Bar M.D. On: 04/21/2021 04:44   DG HIP UNILAT WITH PELVIS 2-3 VIEWS RIGHT  Result Date: 04/21/2021 CLINICAL DATA:  87 year old female with history of fall complaining of right-sided hip pain. EXAM: DG HIP (WITH OR WITHOUT PELVIS) 2-3V RIGHT COMPARISON:  No priors. FINDINGS: AP view of the bony pelvis and AP and lateral view of the right hip demonstrate a mildly displaced fracture of the right femoral neck which appears predominantly transcervical. There is approximately 1.4 cm of cephalad migration of the distal femur relative to the femoral head, likely with some mild impaction. Femoral head remains located in the right acetabulum. Bony pelvis itself appears intact, as do the visualized portions of the left proximal femur. There is joint space narrowing, subchondral sclerosis, subchondral cyst  formation and osteophyte formation in both hip joints, compatible with moderate osteoarthritis. IMPRESSION: 1. Mildly displaced transcervical right femoral neck fracture, as above. 2. Moderate bilateral hip joint osteoarthritis. Electronically Signed   By: Trudie Reed M.D.   On: 04/21/2021 05:45    EKG: Independently reviewed.  Afib with rate 103; nonspecific ST changes with no evidence of acute ischemia   Labs on Admission: I have personally reviewed the available labs and imaging studies at the time of the admission.  Pertinent labs:    Glucose 165 BUN 13/Creatinine 1.45/GFR 34; comparison from 2018 13/0.86/59 WBC 11.6 Hgb 11.7 INR 1.6  Urine culture report from Dr. Wylene Simmer:  F Urine Culture, Routine Final report A F Result 1 Escherichia coli A Cefazolin <=4 ug/mL Cefazolin with an MIC <=16 predicts susceptibility to the oral agents cefaclor, cefdinir, cefpodoxime, cefprozil, cefuroxime, cephalexin, and loracarbef when used for therapy of uncomplicated urinary tract infections due to E. coli, Klebsiella pneumoniae, and Proteus mirabilis. Greater than 100,000 colony forming units per mL F Antimicrobial Susceptibility Comment ** S = Susceptible; I = Intermediate; R = Resistant ** P = Positive; N = Negative MICS are expressed in micrograms per mL Antibiotic RSLT#1 RSLT#2 RSLT#3 RSLT#4 Amoxicillin/Clavulanic Acid S Ampicillin R Cefepime S Ceftriaxone S Cefuroxime Doralee, Kocak   1925-09-11   F Accession ID: [id number] NAME VALUE Ciprofloxacin S Ertapenem S Gentamicin S Imipenem S Levofloxacin S Meropenem S Nitrofurantoin S Piperacillin/Tazobactam S Tetracycline R Tobramycin S Trimethoprim/Sulfa R     Assessment/Plan * Closed right hip fracture, initial encounter (HCC)- (present on admission) -Mechanical fall resulting in hip fracture -Orthopedics consulted -She is on Xarelto so no surgery for 48-72 hours -SCDs for now, start Lovenox post-operatively (or as per ortho) -Pain control with  Robxain, Vicodin, and Morphine prn -TOC team consult for rehab placement -Will need PT consult post-operatively -Hip fracture order set utilized -Iliacus block ordered per order set  Pre-operative stratification -Orthopedic/spinal surgery is associated with an intermediate (1-5%) cardiovascular risk for cardiac death and nonfatal MI -Her revised cardiac index gives a risk estimate of 3.9% -Because of this risk, she is recommended to have pre-operative EKG testing prior to surgery; this was done in the ER -Her Detsky's Modified Cardiac Risk Index score is with a low cardiac risk -It is reasonable for her to go to the OR without additional evaluation  Acute lower UTI- (present on admission) -Patient was recently diagnosed with UTI; symptoms were mostly related to mood disturbance -Urine culture grew E coli -She was treated with Bactrim but urine culture showed resistance to this agent -WBC 11.6 - but  she is also likely stressed -Prior culture result from Dr. Wylene Simmer sent via Secure Chat -Will continue Rocephin as inpatient, likely can complete treatment  DNR (do not resuscitate)- (present on admission) -I have discussed code status with the patient and her son and  they are in agreement that the patient would not desire resuscitation and would prefer to die a natural death should that situation arise. -She will need a gold out of facility DNR form at the time of discharge  Thyroid disease -Continue Synthroid  Hypertension -Continue Atenolol  Hyperlipidemia -Continue Pravastatin  Diabetes mellitus without complication (HCC) -Diet controlled -Cover with sensitive-scale SSI  Permanent atrial fibrillation (HCC)- (present on admission) -Rate controlled with atenolol -Xarelto on hold for surgery  Pacemaker - Medtronic Azure single chamber pacemaker 07/12/2016.- (present on admission) -Noted    Advance Care Planning:   Code Status: DNR   Consults: Orthopedics; anesthesiology;  nutrition; TOC team; will need PT post-operatively  Family Communication: Son was present throughout evaluation  Severity of Illness: The appropriate patient status for this patient is INPATIENT. Inpatient status is judged to be reasonable and necessary in order to provide the required intensity of service to ensure the patient's safety. The patient's presenting symptoms, physical exam findings, and initial radiographic and laboratory data in the context of their chronic comorbidities is felt to place them at high risk for further clinical deterioration. Furthermore, it is not anticipated that the patient will be medically stable for discharge from the hospital within 2 midnights of admission.   * I certify that at the point of admission it is my clinical judgment that the patient will require inpatient hospital care spanning beyond 2 midnights from the point of admission due to high intensity of service, high risk for further deterioration and high frequency of surveillance required.*  Author: Jonah Blue 04/21/2021 4:42 PM  For on call review [email or web address].

## 2021-04-21 NOTE — Anesthesia Pain Management Evaluation Note (Signed)
°  Anesthesia Pain Consult Note  Patient: Kerry Carlson, 87 y.o., female  Consult Requested by: Briscoe Deutscher, MD  Reason for Consult: hip fracture  Level of Consciousness: alert  Pain: moderate   Last Vitals:  Vitals:   04/21/21 1215 04/21/21 1218  BP: 134/77   Pulse: (!) 40   Resp: (!) 21   Temp:  36.8 C  SpO2: 96%     Plan: Peripheral nerve block for pain control  Risks of wet tap, epidural hematoma and spinal cord injury explained to:   Consent:Risks of procedure as well as the alternatives and risks of each were explained to the (patient/caregiver).  Consent for procedure obtained.  Advance Directive:Patient named surrogate decision maker / provided an advance care plan.  Allergies  Allergen Reactions   Aleve [Naproxen] Hives   Aspirin Nausea And Vomiting   Penicillins Hives    Had a really long time ago, caused hives Has patient had a PCN reaction causing immediate rash, facial/tongue/throat swelling, SOB or lightheadedness with hypotension: unknown Has patient had a PCN reaction causing severe rash involving mucus membranes or skin necrosis: unknown Has patient had a PCN reaction that required hospitalization unknown Has patient had a PCN reaction occurring within the last 10 years:no If all of the above answers are "NO", then may proceed with Cephalosporin use.    Physical exam: PULM normal  CARDIO Rhythm: Irregular, tachycardic  OTHER    I have reviewed the patient's medications listed below.  atenolol  100 mg Oral BID   docusate sodium  100 mg Oral BID   famotidine  20 mg Oral Daily   [START ON 04/22/2021] levothyroxine  100 mcg Oral QAC breakfast   pravastatin  10 mg Oral Daily    methocarbamol (ROBAXIN) IV     acetaminophen, bisacodyl, methocarbamol **OR** methocarbamol (ROBAXIN) IV, morphine injection, ondansetron (ZOFRAN) IV, oxyCODONE, polyethylene glycol  Past Medical History:  Diagnosis Date   Atrial fibrillation (HCC)    Diabetes  mellitus without complication (HCC)    Type 2   Hyperlipidemia    Hypertension    Pacemaker - Medtronic Azure single chamber pacemaker 07/12/2016. 05/23/2020   Second degree Mobitz II AV block 05/23/2020   Thyroid disease    Hypothyroid   Past Surgical History:  Procedure Laterality Date   ABDOMINAL HYSTERECTOMY     PACEMAKER INSERTION  10/11/2016   Medtronic Azure MRI single-chamber pacemaker in [place]    reports that she has never smoked. She has never used smokeless tobacco. She reports that she does not drink alcohol and does not use drugs.    Lucretia Kern 04/21/2021

## 2021-04-21 NOTE — Anesthesia Procedure Notes (Signed)
Anesthesia Regional Block: Femoral nerve block   Pre-Anesthetic Checklist: , timeout performed,  Correct Patient, Correct Site, Correct Laterality,  Correct Procedure, Correct Position, site marked,  Risks and benefits discussed,  Surgical consent,  Pre-op evaluation,  At surgeon's request and post-op pain management  Laterality: Right  Prep: chloraprep       Needles:  Injection technique: Single-shot  Needle Type: Echogenic Stimulator Needle     Needle Length: 10cm  Needle Gauge: 20     Additional Needles:   Procedures:,,,, ultrasound used (permanent image in chart),,    Narrative:  Start time: 04/21/2021 12:44 PM End time: 04/21/2021 12:53 PM Injection made incrementally with aspirations every 5 mL.  Performed by: Personally  Anesthesiologist: Lucretia Kern, MD  Additional Notes: Standard monitors applied. Skin prepped. Good needle visualization with ultrasound. Injection made in 5cc increments with no resistance to injection. Patient tolerated the procedure well.

## 2021-04-21 NOTE — Progress Notes (Signed)
Pt seen in room with dtr in law, Pt alert/responsive  in no apparent distress. Pt orientated to room/equipments, menu provided, and hospital valuables policy has been discussed with dtr in law, and no complaints voiced.Hospital bed in lowest position with 3 side rails up, call bell/room phone within reach and all wheels locked.

## 2021-04-21 NOTE — Assessment & Plan Note (Addendum)
-  Rate controlled with atenolol -Xarelto resumed after surgery

## 2021-04-21 NOTE — Progress Notes (Signed)
Patient transported back to the emergency room from PACU following femoral nerve block, receiving RN Marisue Ivan at bedside, son at bedside, no questions or concerns from receiving RN or son.  Hermina Barters, RN

## 2021-04-21 NOTE — Assessment & Plan Note (Addendum)
Sliding scale Novolog. CBG's at goal.

## 2021-04-21 NOTE — ED Provider Notes (Signed)
[facility] EMERGENCY DEPARTMENT Provider Note   CSN: [id number] Arrival date & time: 04/21/21  0347     History Chief Complaint  Patient presents with   Fall    Mechanical fall at home, right hip pain with shortening, possible blood thinners    Kerry Carlson is a 87 y.o. female.  87 year old female who states that she was walking to the bathroom when she suddenly fell.  She states that she did not really remember slipping, tripping.  She did not pass out.  She did not hit her head.  She states that her right hip hurt severely.  EMS was called put her in a pelvic binder and brought her here for further evaluation.  Did give some fentanyl on the way here which seemed to help.  She still has some pain with range of motion of that right hip.   Fall      Past Medical History:  Diagnosis Date   Diabetes mellitus without complication ([place])    Type 2   Encounter for care of pacemaker 05/23/2020   Hyperlipidemia    Hypertension    Pacemaker - Medtronic Azure single chamber pacemaker 07/12/2016. 05/23/2020   Second degree Mobitz II AV block 05/23/2020   Thyroid disease    Hypothyroid    Patient Active Problem List   Diagnosis Date Noted   Encounter for care of pacemaker 05/23/2020   Pacemaker - Medtronic Azure single chamber pacemaker 07/12/2016. 05/23/2020   Second degree Mobitz II AV block 05/23/2020   Permanent atrial fibrillation ([place]) 05/23/2020    Past Surgical History:  Procedure Laterality Date   ABDOMINAL HYSTERECTOMY     PACEMAKER INSERTION  10/11/2016   Medtronic Azure MRI single-chamber pacemaker in [place]     OB History   No obstetric history on file.     Family History  Problem Relation Age of Onset   Stroke Mother    Stroke Father     Social History   Tobacco Use   Smoking status: Never   Smokeless tobacco: Never  Vaping Use   Vaping Use: Never used  Substance Use Topics   Alcohol use: No   Drug use: Never    Home  Medications Prior to Admission medications   Medication Sig Start Date End Date Taking? Authorizing Provider  acetaminophen (TYLENOL) 500 MG tablet Take 1,000 mg by mouth every 6 (six) hours as needed.    [provider]  atenolol (TENORMIN) 100 MG tablet Take 1 tablet (100 mg total) by mouth daily. Patient taking differently: Take 100 mg by mouth 2 (two) times daily. 03/16/20   Adrian Prows, MD  famotidine (PEPCID) 20 MG tablet  10/08/19   [provider]  levothyroxine (SYNTHROID) 100 MCG tablet Take 100 mcg by mouth daily before breakfast.     [provider]  meclizine (ANTIVERT) 25 MG tablet Take 25 mg by mouth daily as needed for dizziness.    [provider]  pravastatin (PRAVACHOL) 10 MG tablet Take 10 mg by mouth daily.    [provider]  Rivaroxaban (XARELTO) 15 MG TABS tablet Take 15 mg by mouth daily with supper.    [provider]    Allergies    Aleve [naproxen], Aspirin, and Penicillins  Review of Systems   Review of Systems  All other systems reviewed and are negative.  Physical Exam Updated Vital Signs BP (!) 164/105    Pulse (!) 102    Temp 98.2 F (36.8  C) (Oral)    Resp 18    SpO2 99%   Physical Exam Vitals and nursing note reviewed.  Constitutional:      Appearance: She is well-developed.  HENT:     Head: Normocephalic and atraumatic.     Nose: Nose normal. No congestion or rhinorrhea.     Mouth/Throat:     Mouth: Mucous membranes are moist.     Pharynx: Oropharynx is clear.  Eyes:     Pupils: Pupils are equal, round, and reactive to light.  Cardiovascular:     Rate and Rhythm: Normal rate and regular rhythm.  Pulmonary:     Effort: No respiratory distress.     Breath sounds: No stridor.  Abdominal:     General: Abdomen is flat. There is no distension.  Musculoskeletal:        General: Tenderness (right hip with ROm) and deformity (Right leg shortened. not rotated, but is in a binder.) present. No  swelling. Normal range of motion.     Cervical back: Normal range of motion.  Skin:    General: Skin is warm and dry.     Coloration: Skin is not jaundiced or pale.  Neurological:     General: No focal deficit present.     Mental Status: She is alert.    ED Results / Procedures / Treatments   Labs (all labs ordered are listed, but only abnormal results are displayed) Labs Reviewed  BASIC METABOLIC PANEL - Abnormal; Notable for the following components:      Result Value   Glucose, Bld 165 (*)    Creatinine, Ser 1.45 (*)    Calcium 8.5 (*)    GFR, Estimated 34 (*)    All other components within normal limits  CBC WITH DIFFERENTIAL/PLATELET - Abnormal; Notable for the following components:   WBC 11.6 (*)    RBC 3.79 (*)    Hemoglobin 11.7 (*)    Neutro Abs 9.4 (*)    All other components within normal limits  PROTIME-INR - Abnormal; Notable for the following components:   Prothrombin Time 19.3 (*)    INR 1.6 (*)    All other components within normal limits  CBC - Abnormal; Notable for the following components:   RBC 3.62 (*)    Hemoglobin 11.1 (*)    HCT 34.9 (*)    All other components within normal limits  BASIC METABOLIC PANEL - Abnormal; Notable for the following components:   Glucose, Bld 161 (*)    Creatinine, Ser 1.28 (*)    Calcium 8.6 (*)    GFR, Estimated 39 (*)    All other components within normal limits  GLUCOSE, CAPILLARY - Abnormal; Notable for the following components:   Glucose-Capillary 150 (*)    All other components within normal limits  GLUCOSE, CAPILLARY - Abnormal; Notable for the following components:   Glucose-Capillary 181 (*)    All other components within normal limits  GLUCOSE, CAPILLARY - Abnormal; Notable for the following components:   Glucose-Capillary 165 (*)    All other components within normal limits  GLUCOSE, CAPILLARY - Abnormal; Notable for the following components:   Glucose-Capillary 113 (*)    All other components within  normal limits  GLUCOSE, CAPILLARY - Abnormal; Notable for the following components:   Glucose-Capillary 119 (*)    All other components within normal limits  RESP PANEL BY RT-PCR (FLU A&B, COVID) ARPGX2  GLUCOSE, CAPILLARY  BASIC METABOLIC PANEL  CBC  TYPE AND SCREEN  ABO/RH    EKG None  Radiology DG Chest 1 View  Result Date: 04/21/2021 CLINICAL DATA:  Fall with right hip pain. EXAM: CHEST  1 VIEW COMPARISON:  None. FINDINGS: The heart is moderately enlarged. The central vasculature is normal caliber. There are heavy calcifications in the aortic arch. Left chest single lead cardiac assist device and ventricular wire insertion. The lungs are clear. No pleural effusion is seen. There is eventration of the medial right hemidiaphragm. Osteopenia and mild thoracic degenerative features. No displaced rib fractures. IMPRESSION: Cardiomegaly with no acute chest findings. Electronically Signed   By: Almira Bar M.D.   On: 04/21/2021 04:46   DG Hip Unilat With Pelvis 2-3 Views Left  Addendum Date: 04/21/2021   ADDENDUM REPORT: 04/21/2021 05:44 ADDENDUM: Additional images were subsequently performed on the left hip and demonstrate osteopenia without evidence of fractures and mild features of nonerosive degenerative arthrosis. Electronically Signed   By: Almira Bar M.D.   On: 04/21/2021 05:44   Result Date: 04/21/2021 CLINICAL DATA:  Larey Seat with right hip pain. EXAM: DG HIP (WITH OR WITHOUT PELVIS) 2-3V LEFT COMPARISON:  None. FINDINGS: There is osteopenia with acute transverse subcapital proximal right femoral fracture, with the distal fragment cephalad displaced up to 2 cm and with mild impaction and internal rotation of the distal fragment. Both pelvic rings are intact. The visualized proximal left femur is intact. No further evidence of fractures. No widening of the SI joints and pubic symphysis. There are mild degenerative features at the SI joints and hips. IMPRESSION: Osteopenia with  acute subcapital transverse proximal right femoral fracture with impaction, cephalad displacement and internal rotation of the distal fragment. Electronically Signed: By: Almira Bar M.D. On: 04/21/2021 04:44   DG HIP UNILAT WITH PELVIS 2-3 VIEWS RIGHT  Result Date: 04/21/2021 CLINICAL DATA:  87 year old female with history of fall complaining of right-sided hip pain. EXAM: DG HIP (WITH OR WITHOUT PELVIS) 2-3V RIGHT COMPARISON:  No priors. FINDINGS: AP view of the bony pelvis and AP and lateral view of the right hip demonstrate a mildly displaced fracture of the right femoral neck which appears predominantly transcervical. There is approximately 1.4 cm of cephalad migration of the distal femur relative to the femoral head, likely with some mild impaction. Femoral head remains located in the right acetabulum. Bony pelvis itself appears intact, as do the visualized portions of the left proximal femur. There is joint space narrowing, subchondral sclerosis, subchondral cyst formation and osteophyte formation in both hip joints, compatible with moderate osteoarthritis. IMPRESSION: 1. Mildly displaced transcervical right femoral neck fracture, as above. 2. Moderate bilateral hip joint osteoarthritis. Electronically Signed   By: Trudie Reed M.D.   On: 04/21/2021 05:45    Procedures Procedures   Medications Ordered in ED Medications  atenolol (TENORMIN) tablet 100 mg (100 mg Oral Given 04/22/21 2231)  pravastatin (PRAVACHOL) tablet 10 mg (10 mg Oral Given 04/22/21 0903)  levothyroxine (SYNTHROID) tablet 100 mcg (100 mcg Oral Given 04/22/21 0549)  famotidine (PEPCID) tablet 20 mg (20 mg Oral Given 04/22/21 0903)  morphine 2 MG/ML injection 2 mg (has no administration in time range)  methocarbamol (ROBAXIN) tablet 500 mg (has no administration in time range)    Or  methocarbamol (ROBAXIN) 500 mg in dextrose 5 % 50 mL IVPB (has no administration in time range)  docusate sodium (COLACE) capsule 100  mg (100 mg Oral Given 04/22/21 2231)  polyethylene glycol (MIRALAX / GLYCOLAX) packet 17 g (has no administration in time range)  bisacodyl (DULCOLAX) EC tablet 5 mg (has no administration in time range)  oxyCODONE (Oxy IR/ROXICODONE) immediate release tablet 5-10 mg (has no administration in time range)  acetaminophen (TYLENOL) tablet 650 mg (has no administration in time range)  ondansetron (ZOFRAN) injection 4 mg (has no administration in time range)  insulin aspart (novoLOG) injection 0-9 Units (0 Units Subcutaneous Not Given 04/22/21 1703)  cefTRIAXone (ROCEPHIN) 2 g in sodium chloride 0.9 % 100 mL IVPB (2 g Intravenous New Bag/Given 04/22/21 0549)  feeding supplement (ENSURE ENLIVE / ENSURE PLUS) liquid 237 mL (237 mLs Oral Given 04/22/21 1703)  multivitamin with minerals tablet 1 tablet (1 tablet Oral Given 04/22/21 1706)  fentaNYL (SUBLIMAZE) injection 50 mcg (50 mcg Intravenous Given 04/21/21 1036)  cefTRIAXone (ROCEPHIN) 2 g in sodium chloride 0.9 % 100 mL IVPB (0 g Intravenous Stopped 04/21/21 0634)  fentaNYL (SUBLIMAZE) 100 MCG/2ML injection (  Override pull for Anesthesia 04/21/21 1301)    ED Course  I have reviewed the triage vital signs and the nursing notes.  Pertinent labs & imaging results that were available during my care of the patient were reviewed by me and considered in my medical decision making (see chart for details).    MDM Rules/Calculators/A&P                         Suspect hip fracture. On xarelto as well, but did not hit head will forego head imaging at this time.  Discussed with Dr. Ninfa Linden, will plan for OR possibly Sunday/Monday depending on medicine recommendations based on xarelto clearing.  Discussed with hospitalist for admission.    Final Clinical Impression(s) / ED Diagnoses Final diagnoses:  None    Rx / DC Orders ED Discharge Orders     None        Breanne Olvera, Corene Cornea, MD 04/22/21 2236

## 2021-04-21 NOTE — Assessment & Plan Note (Addendum)
BP's elevated today, suspect due to pain -Continue Atenolol -Adequate pain control --PRN oral hydralazine for SBP>160

## 2021-04-21 NOTE — Assessment & Plan Note (Signed)
Continue Synthroid °

## 2021-04-21 NOTE — Progress Notes (Signed)
Orthopedic Tech Progress Note Patient Details:  KAYTLYNN KOCHAN 09-Aug-1925 [id number]  Patient ID: Enedina Finner, female   DOB: [birth year], 87 y.o.   MRN: [id number] No OHF; pt over 72.  Darleen Crocker 04/21/2021, 8:58 PM

## 2021-04-21 NOTE — Assessment & Plan Note (Addendum)
Orthopedic surgery following. Xarelto held on admission, resumed post-op. Status post right hip hemiarthroplasty on 04/24/21. --resume Xarelto when ok w/ ortho --PT/OT evaluations --anticipate need for rehab  --pain control and bowel regimen --scheduled Tylenol to minimize use of opioids given her confusion and somnolence today

## 2021-04-21 NOTE — Addendum Note (Signed)
Addendum  created 04/21/21 1448 by Lucretia Kern, MD   Intraprocedure Meds edited

## 2021-04-21 NOTE — Progress Notes (Signed)
Patient ID: Kerry Carlson, female   DOB: Aug 10, 1925, 87 y.o.   MRN: [id number] I have reviewed the patient's chart and her x-rays.  She does have a right hip femoral neck fracture.  This will require surgical intervention such as a right hip hemiarthroplasty.  However, she is on Xarelto and did have Xarelto yesterday (Thursday).  Surgery will then need to be delayed for 48 to 72 hours due to bleeding risk and the chance for having spinal anesthesia for this surgery.  She can eat today and tomorrow.  I will see her at some point today to discuss with family and for surgical planning purposes and timing of surgery purposes.

## 2021-04-21 NOTE — ED Notes (Signed)
Purewick was not properly placed, RN and NT changed pt and rolled out EMS pad from under pt. Premedicated for pain. Pt tolerated well. Pt resting in stretcher, no distress noted, son at bedside, call light in reach. Fresh blankets provided.

## 2021-04-21 NOTE — Assessment & Plan Note (Addendum)
Code status was discussed on admission with the patient and her son and  they are in agreement that the patient would not desire resuscitation and would prefer to die a natural death should that situation arise. --will need a gold out of facility DNR form at the time of discharge

## 2021-04-21 NOTE — Consult Note (Signed)
Reason for Consult:Right hip fx Referring Physician: Mitzi Hansen Time called: [id number] Time at bedside: 0922   Kerry Carlson is an 87 y.o. female.  HPI: Kerry Carlson was at home, lost her balance, and fell. She had immediate right hip pain and could not get up. She lay there for about 30 minutes until she could summon help. She was brought to the ED where x-rays showed a right hip fx and orthopedic surgery was consulted. She lives at home with family and does not use any assistive devices to ambulate.  Past Medical History:  Diagnosis Date   Atrial fibrillation ([place])    Diabetes mellitus without complication ([place])    Type 2   Hyperlipidemia    Hypertension    Pacemaker - Medtronic Azure single chamber pacemaker 07/12/2016. 05/23/2020   Second degree Mobitz II AV block 05/23/2020   Thyroid disease    Hypothyroid    Past Surgical History:  Procedure Laterality Date   ABDOMINAL HYSTERECTOMY     PACEMAKER INSERTION  10/11/2016   Medtronic Azure MRI single-chamber pacemaker in [place]    Family History  Problem Relation Age of Onset   Stroke Mother    Stroke Father     Social History:  reports that she has never smoked. She has never used smokeless tobacco. She reports that she does not drink alcohol and does not use drugs.  Allergies:  Allergies  Allergen Reactions   Aleve [Naproxen] Hives   Aspirin Nausea And Vomiting   Penicillins Hives    Had a really long time ago, caused hives Has patient had a PCN reaction causing immediate rash, facial/tongue/throat swelling, SOB or lightheadedness with hypotension: unknown Has patient had a PCN reaction causing severe rash involving mucus membranes or skin necrosis: unknown Has patient had a PCN reaction that required hospitalization unknown Has patient had a PCN reaction occurring within the last 10 years:no If all of the above answers are "NO", then may proceed with Cephalosporin use.    Medications: I have reviewed the patient's current  medications.  Results for orders placed or performed during the hospital encounter of 04/21/21 (from the past 48 hour(s))  Basic metabolic panel     Status: Abnormal   Collection Time: 04/21/21  4:03 AM  Result Value Ref Range   Sodium 137 135 - 145 mmol/L   Potassium 4.3 3.5 - 5.1 mmol/L   Chloride 107 98 - 111 mmol/L   CO2 24 22 - 32 mmol/L   Glucose, Bld 165 (H) 70 - 99 mg/dL    Comment: Glucose reference range applies only to samples taken after fasting for at least 8 hours.   BUN 13 8 - 23 mg/dL   Creatinine, Ser 1.45 (H) 0.44 - 1.00 mg/dL   Calcium 8.5 (L) 8.9 - 10.3 mg/dL   GFR, Estimated 34 (L) >60 mL/min    Comment: (NOTE) Calculated using the CKD-EPI Creatinine Equation (2021)    Anion gap 6 5 - 15    Comment: Performed at [facility] [address] [place], [place] [address]  CBC WITH DIFFERENTIAL     Status: Abnormal   Collection Time: 04/21/21  4:03 AM  Result Value Ref Range   WBC 11.6 (H) 4.0 - 10.5 K/uL   RBC 3.79 (L) 3.87 - 5.11 MIL/uL   Hemoglobin 11.7 (L) 12.0 - 15.0 g/dL   HCT 36.9 36.0 - 46.0 %   MCV 97.4 80.0 - 100.0 fL   MCH 30.9 26.0 - 34.0 pg  MCHC 31.7 30.0 - 36.0 g/dL   RDW 13.7 11.5 - 15.5 %   Platelets 219 150 - 400 K/uL   nRBC 0.0 0.0 - 0.2 %   Neutrophils Relative % 81 %   Neutro Abs 9.4 (H) 1.7 - 7.7 K/uL   Lymphocytes Relative 13 %   Lymphs Abs 1.5 0.7 - 4.0 K/uL   Monocytes Relative 4 %   Monocytes Absolute 0.5 0.1 - 1.0 K/uL   Eosinophils Relative 1 %   Eosinophils Absolute 0.2 0.0 - 0.5 K/uL   Basophils Relative 0 %   Basophils Absolute 0.0 0.0 - 0.1 K/uL   Immature Granulocytes 1 %   Abs Immature Granulocytes 0.06 0.00 - 0.07 K/uL    Comment: Performed at [facility] [address], [place], [place] [address]  Protime-INR     Status: Abnormal   Collection Time: 04/21/21  4:03 AM  Result Value Ref Range   Prothrombin Time 19.3 (H) 11.4 - 15.2 seconds   INR 1.6 (H) 0.8 - 1.2    Comment: (NOTE) INR goal varies  based on device and disease states. Performed at [facility], [place] [address] [place], [place] [address]   ABO/Rh     Status: None   Collection Time: 04/21/21  4:30 AM  Result Value Ref Range   ABO/RH(D)      O POS Performed at [facility] [address], [place], [place] [address]   Type and screen [place]     Status: None   Collection Time: 04/21/21  4:45 AM  Result Value Ref Range   ABO/RH(D) O POS    Antibody Screen NEG    Sample Expiration      04/24/2021,2359 Performed at [facility], [place] [address] [place], [place] [address]     DG Chest 1 View  Result Date: 04/21/2021 CLINICAL DATA:  Fall with right hip pain. EXAM: CHEST  1 VIEW COMPARISON:  None. FINDINGS: The heart is moderately enlarged. The central vasculature is normal caliber. There are heavy calcifications in the aortic arch. Left chest single lead cardiac assist device and ventricular wire insertion. The lungs are clear. No pleural effusion is seen. There is eventration of the medial right hemidiaphragm. Osteopenia and mild thoracic degenerative features. No displaced rib fractures. IMPRESSION: Cardiomegaly with no acute chest findings. Electronically Signed   By: Telford Nab M.D.   On: 04/21/2021 04:46   DG Hip Unilat With Pelvis 2-3 Views Left  Addendum Date: 04/21/2021   ADDENDUM REPORT: 04/21/2021 05:44 ADDENDUM: Additional images were subsequently performed on the left hip and demonstrate osteopenia without evidence of fractures and mild features of nonerosive degenerative arthrosis. Electronically Signed   By: Telford Nab M.D.   On: 04/21/2021 05:44   Result Date: 04/21/2021 CLINICAL DATA:  [place] with right hip pain. EXAM: DG HIP (WITH OR WITHOUT PELVIS) 2-3V LEFT COMPARISON:  None. FINDINGS: There is osteopenia with acute transverse subcapital proximal right femoral fracture, with the distal fragment cephalad displaced up to 2 cm and with mild impaction and  internal rotation of the distal fragment. Both pelvic rings are intact. The visualized proximal left femur is intact. No further evidence of fractures. No widening of the SI joints and pubic symphysis. There are mild degenerative features at the SI joints and hips. IMPRESSION: Osteopenia with acute subcapital transverse proximal right femoral fracture with impaction, cephalad displacement and internal rotation of the distal fragment. Electronically Signed: By:  Almira Bar M.D. On: 04/21/2021 04:44   DG HIP UNILAT WITH PELVIS 2-3 VIEWS RIGHT  Result Date: 04/21/2021 CLINICAL DATA:  87 year old female with history of fall complaining of right-sided hip pain. EXAM: DG HIP (WITH OR WITHOUT PELVIS) 2-3V RIGHT COMPARISON:  No priors. FINDINGS: AP view of the bony pelvis and AP and lateral view of the right hip demonstrate a mildly displaced fracture of the right femoral neck which appears predominantly transcervical. There is approximately 1.4 cm of cephalad migration of the distal femur relative to the femoral head, likely with some mild impaction. Femoral head remains located in the right acetabulum. Bony pelvis itself appears intact, as do the visualized portions of the left proximal femur. There is joint space narrowing, subchondral sclerosis, subchondral cyst formation and osteophyte formation in both hip joints, compatible with moderate osteoarthritis. IMPRESSION: 1. Mildly displaced transcervical right femoral neck fracture, as above. 2. Moderate bilateral hip joint osteoarthritis. Electronically Signed   By: Trudie Reed M.D.   On: 04/21/2021 05:45    Review of Systems  HENT:  Negative for ear discharge, ear pain, hearing loss and tinnitus.   Eyes:  Negative for photophobia and pain.  Respiratory:  Negative for cough and shortness of breath.   Cardiovascular:  Negative for chest pain.  Gastrointestinal:  Negative for abdominal pain, nausea and vomiting.  Genitourinary:  Negative for dysuria,  flank pain, frequency and urgency.  Musculoskeletal:  Positive for arthralgias (Right hip). Negative for back pain, myalgias and neck pain.  Neurological:  Negative for dizziness and headaches.  Hematological:  Does not bruise/bleed easily.  Psychiatric/Behavioral:  The patient is not nervous/anxious.   Blood pressure (!) 144/90, pulse (!) 56, temperature 98.2 F (36.8 C), temperature source Oral, resp. rate 16, SpO2 99 %. Physical Exam Constitutional:      General: She is not in acute distress.    Appearance: She is well-developed. She is not diaphoretic.  HENT:     Head: Normocephalic and atraumatic.  Eyes:     General: No scleral icterus.       Right eye: No discharge.        Left eye: No discharge.     Conjunctiva/sclera: Conjunctivae normal.  Cardiovascular:     Rate and Rhythm: Normal rate. Rhythm irregular.  Pulmonary:     Effort: Pulmonary effort is normal. No respiratory distress.  Musculoskeletal:     Cervical back: Normal range of motion.     Comments: RLE No traumatic wounds, ecchymosis, or rash  Mild TTP hip  No knee or ankle effusion  Knee stable to varus/ valgus and anterior/posterior stress  Sens DPN, SPN, TN intact  Motor EHL, ext, flex, evers 5/5  DP 1+, PT 0, No significant edema  Skin:    General: Skin is warm and dry.  Neurological:     Mental Status: She is alert.  Psychiatric:        Mood and Affect: Mood normal.        Behavior: Behavior normal.    Assessment/Plan: Right hip fx -- Plan hip hemi Sunday or Monday with Dr. Magnus Ivan once Xarelto has had time to wash out.    Freeman Caldron, PA-C Orthopedic Surgery [phone] 04/21/2021, 9:34 AM

## 2021-04-21 NOTE — Assessment & Plan Note (Addendum)
Completed IV Rocephin x 3 days.   Urine cultures appear not sent.   WBC spiked to 13 on 1/1, but normalized.   No fevers.  Repeat urinalysis unremarkable.   --CBC in AM

## 2021-04-21 NOTE — ED Triage Notes (Signed)
Pt biba from home s/p mechanical fall with right hip pain. Pt on possible blood thinners, pelvic binder placed by ems. Pt given fentanyl PTA with relief. No loc

## 2021-04-21 NOTE — Assessment & Plan Note (Addendum)
-  Continue Pravastatin ?

## 2021-04-21 NOTE — Assessment & Plan Note (Deleted)
Noted  

## 2021-04-21 NOTE — ED Notes (Signed)
Patient is resting comfortably. Family at bedside.  

## 2021-04-22 ENCOUNTER — Encounter (HOSPITAL_COMMUNITY): Payer: Self-pay | Admitting: Internal Medicine

## 2021-04-22 DIAGNOSIS — N179 Acute kidney failure, unspecified: Secondary | ICD-10-CM | POA: Diagnosis present

## 2021-04-22 DIAGNOSIS — E119 Type 2 diabetes mellitus without complications: Secondary | ICD-10-CM

## 2021-04-22 DIAGNOSIS — N390 Urinary tract infection, site not specified: Secondary | ICD-10-CM

## 2021-04-22 DIAGNOSIS — I4821 Permanent atrial fibrillation: Secondary | ICD-10-CM

## 2021-04-22 LAB — BASIC METABOLIC PANEL
Anion gap: 9 (ref 5–15)
BUN: 15 mg/dL (ref 8–23)
CO2: 24 mmol/L (ref 22–32)
Calcium: 8.6 mg/dL — ABNORMAL LOW (ref 8.9–10.3)
Chloride: 103 mmol/L (ref 98–111)
Creatinine, Ser: 1.28 mg/dL — ABNORMAL HIGH (ref 0.44–1.00)
GFR, Estimated: 39 mL/min — ABNORMAL LOW (ref >60)
Glucose, Bld: 161 mg/dL — ABNORMAL HIGH (ref 70–99)
Potassium: 4.7 mmol/L (ref 3.5–5.1)
Sodium: 136 mmol/L (ref 135–145)

## 2021-04-22 LAB — GLUCOSE, CAPILLARY
Glucose-Capillary: 181 mg/dL — ABNORMAL HIGH (ref 70–99)
Glucose-Capillary: 165 mg/dL — ABNORMAL HIGH (ref 70–99)
Glucose-Capillary: 113 mg/dL — ABNORMAL HIGH (ref 70–99)
Glucose-Capillary: 88 mg/dL (ref 70–99)
Glucose-Capillary: 119 mg/dL — ABNORMAL HIGH (ref 70–99)

## 2021-04-22 LAB — CBC
HCT: 34.9 % — ABNORMAL LOW (ref 36.0–46.0)
Hemoglobin: 11.1 g/dL — ABNORMAL LOW (ref 12.0–15.0)
MCH: 30.7 pg (ref 26.0–34.0)
MCHC: 31.8 g/dL (ref 30.0–36.0)
MCV: 96.4 fL (ref 80.0–100.0)
Platelets: 194 10*3/uL (ref 150–400)
RBC: 3.62 MIL/uL — ABNORMAL LOW (ref 3.87–5.11)
RDW: 13.8 % (ref 11.5–15.5)
WBC: 6.3 10*3/uL (ref 4.0–10.5)
nRBC: 0 % (ref 0.0–0.2)

## 2021-04-22 MED ORDER — ADULT MULTIVITAMIN W/MINERALS CH
1.0000 | ORAL_TABLET | Freq: Every day | ORAL | Status: DC
  Administered 2021-04-22 – 2021-04-26 (×5): 1 via ORAL
  Filled 2021-04-22 (×5): qty 1

## 2021-04-22 MED ORDER — ENSURE ENLIVE PO LIQD
237.0000 mL | Freq: Two times a day (BID) | ORAL | Status: DC
  Administered 2021-04-22 – 2021-04-26 (×7): 237 mL via ORAL
  Filled 2021-04-22: qty 237

## 2021-04-22 NOTE — Assessment & Plan Note (Addendum)
Continue home medications.  Blood pressure stable, although a little elevated this morning.

## 2021-04-22 NOTE — Progress Notes (Addendum)
Initial Nutrition Assessment  DOCUMENTATION CODES:   Not applicable  INTERVENTION:   Ensure Enlive po BID, each supplement provides 350 kcal and 20 grams of protein MVI with minerals daily  NUTRITION DIAGNOSIS:   Increased nutrient needs related to hip fracture as evidenced by estimated needs.  GOAL:   Patient will meet greater than or equal to 90% of their needs  MONITOR:   PO intake, Supplement acceptance, Labs  REASON FOR ASSESSMENT:        ASSESSMENT:   87 yo female admitted with closed R hip fracture s/p mechanical fall at home. PMH includes DM, HTN, HLD, hypothyroidism, pacemaker, recent UTI.  Plans for hip surgery Monday.   RD working remotely. Unable to complete NFPE, but spoke with patient's granddaughter on the phone. Patient is typically a good eater. She eats small amounts, but lots of snacks throughout the day. Her usual weight is ~155 lbs. She had gained 4 lbs at a recent doctors appointment when she was diagnosed with a UTI. She likes Ensure mixed with ice cream.   Labs reviewed.  CBG: 181-165  Medications reviewed and include Colace, Novolog, IV antibiotics.  No recent weight available. Per granddaughter, usual weight is ~155 lbs.   NUTRITION - FOCUSED PHYSICAL EXAM:  Unable to complete  Diet Order:   Diet Order             Diet regular Room service appropriate? Yes; Fluid consistency: Thin  Diet effective now                   EDUCATION NEEDS:   No education needs have been identified at this time  Skin:  Skin Assessment: Reviewed RN Assessment  Last BM:  12/31 type 2  Height:   Ht Readings from Last 1 Encounters:  04/22/21 5\' 6"  (1.676 m)    Weight:   Wt Readings from Last 1 Encounters:  08/31/20 67.1 kg    BMI:  Body mass index is 23.89 kg/m.  Estimated Nutritional Needs:   Kcal:  1700-1900  Protein:  85-100 gm  Fluid:  >/= 1.9 L    10/31/20, RD, LDN, CNSC Please refer to Amion for contact information.

## 2021-04-22 NOTE — Assessment & Plan Note (Signed)
History of pacemaker.  Normally on Xarelto, which has been held.  Rate controlled.

## 2021-04-22 NOTE — Progress Notes (Signed)
Patient ID: Kerry Carlson, female   DOB: 1926/01/08, 87 y.o.   MRN: [id number] I spoke with the patient's family at the bedside late yesterday.  I let them know that the plan for surgery will be on Monday for a right hip hemiarthroplasty to treat her acute right hip femoral neck fracture.  This will give time for Xarelto to be out of her system and for antibiotics to treat her UTI prior to placing implants in her body.  For now, she needs to remain nonweightbearing on her right lower extremity.

## 2021-04-22 NOTE — Assessment & Plan Note (Addendum)
Continue IV Rocephin x5 days.  Urine cultures pending.  White blood cell count slightly increased to 13 today.  Unclear why, no fever.  No new signs of infection.  Repeat urinalysis unremarkable.  Rechecking CBC in the morning.

## 2021-04-22 NOTE — Hospital Course (Signed)
87 year old female with past medical history significant for diabetes mellitus, hypothyroidism and hypertension and recent UTI (started on oral antibiotics but then cultures showed to be resistant) presented to the emergency room on 12/30 after mechanical fall and unable to stand.  Found to have a right-sided hip fracture and admitted to hospitalist service.  Started on IV Rocephin for UTI and given fact that she was on Xarelto, admission plan by orthopedic surgery to operate on Monday, 1/2.

## 2021-04-22 NOTE — Progress Notes (Signed)
[facility] Progress Note  Patient: Kerry Carlson    HYW:[id number]  DOA: 04/21/2021    Date of Service: the patient was seen and examined on 04/22/2021  Brief hospital course: 87 year old female with past medical history significant for diabetes mellitus, hypothyroidism and hypertension and recent UTI (started on oral antibiotics but then cultures showed to be resistant) presented to the emergency room on 12/30 after mechanical fall and unable to stand.  Found to have a right-sided hip fracture and admitted to hospitalist service.  Started on IV Rocephin for UTI and given fact that she was on Xarelto, admission plan by orthopedic surgery to operate on Monday, 1/2.  Assessment and Plan: Cardiovascular and Mediastinum Hypertension Assessment & Plan Continue home medications.  Blood pressure stable.  Permanent atrial fibrillation [facility]) Assessment & Plan History of pacemaker.  Normally on Xarelto, which has been held.  Rate controlled.  Endocrine Diabetes mellitus without complication (HCC) Assessment & Plan Sliding scale.  Thyroid disease Assessment & Plan Continue Synthroid  Musculoskeletal and Integument * Closed right hip fracture, initial encounter [facility]) Assessment & Plan Xarelto held.  Seen by orthopedic surgery with plans for right hip hemiarthroplasty versus total hip replacement on Monday, 1/2.  Genitourinary AKI (acute kidney injury) (HCC) Assessment & Plan No previous history of renal disease although with advanced age, diabetes and hypertension, imagine she must have some chronic renal insufficiency.  Creatinine today at 1.28 with GFR of 39.  We will see about getting hospital records.  Labs from 2018 noted normal renal function.  Recheck labs in the morning.  Acute lower UTI Assessment & Plan Continue IV Rocephin x5 days.  Urine cultures pending.    Body mass index is 23.89 kg/m.  Nutrition Problem: Increased nutrient needs Etiology: hip fracture      Consultants: Orthopedic surgery  Procedures: Planned right hip hemiarthroplasty  Antimicrobials: IV Rocephin 12/30-present  Code Status: DNR   Subjective: Patient doing okay.  Does not have any complaints.  No real pain.  Objective: Vitals overall stable although at times, heart rate jumps up to above 100. Vitals:   04/22/21 0552 04/22/21 1355  BP: 129/81 114/63  Pulse: 92 (!) 113  Resp: 19   Temp: 98 F (36.7 C) 97.7 F (36.5 C)  SpO2: 100% 97%    Intake/Output Summary (Last 24 hours) at 04/22/2021 1413 Last data filed at 04/22/2021 0630 Gross per 24 hour  Intake 100 ml  Output 400 ml  Net -300 ml   There were no vitals filed for this visit. Body mass index is 23.89 kg/m.  Exam:  General: Alert and oriented x2, no acute distress HEENT: Normocephalic and atraumatic, mucous membranes are slightly dry Cardiovascular: Irregular rhythm, rate controlled Respiratory: Clear to auscultation bilaterally Abdomen: Soft, nontender, nondistended, positive bowel sounds Musculoskeletal: No clubbing or cyanosis or edema Skin: No skin breaks, tears or lesions Psychiatry: Appropriate, no evidence of psychoses Neurology: No focal deficits  Data Reviewed: My review of labs, imaging, notes and other tests shows no new significant findings.  Noted creatinine is 1.28 down from previous day.  White count has normalized.  Disposition:  Status is: Inpatient  Remains inpatient appropriate because: Plans for surgery on Monday, 1/2        Family Communication: Granddaughter at the bedside DVT Prophylaxis: SCDs Start: 04/21/21 1118   Xarelto on hold   Author: Hollice Espy ,MD 04/22/2021 2:13 PM  To reach On-call, see care teams to locate the attending and reach out via [email or web address].  Between 7PM-7AM, please contact night-coverage If you still have difficulty reaching the attending provider, please page the [facility] (Director on Call) for [facility] on amion  for assistance.

## 2021-04-22 NOTE — Assessment & Plan Note (Addendum)
No previous history of renal disease although with advanced age, diabetes and hypertension, imagine she must have some chronic renal insufficiency.  Creatinine up from previous day, although lower than admission.  Last set of renal function labs are from 2018.  I suspect that she likely has stage IIIb chronic kidney disease.  Continue to follow.

## 2021-04-22 NOTE — Assessment & Plan Note (Addendum)
Sliding scale.  CBGs have been stable.

## 2021-04-22 NOTE — Assessment & Plan Note (Signed)
Continue Synthroid °

## 2021-04-22 NOTE — Assessment & Plan Note (Signed)
Xarelto held.  Seen by orthopedic surgery with plans for right hip hemiarthroplasty versus total hip replacement on Monday, 1/2.

## 2021-04-23 LAB — URINALYSIS, ROUTINE W REFLEX MICROSCOPIC
Bilirubin Urine: NEGATIVE
Glucose, UA: NEGATIVE mg/dL
Hgb urine dipstick: NEGATIVE
Ketones, ur: NEGATIVE mg/dL
Nitrite: NEGATIVE
Protein, ur: NEGATIVE mg/dL
Specific Gravity, Urine: 1.013 (ref 1.005–1.030)
pH: 5 (ref 5.0–8.0)

## 2021-04-23 LAB — CBC
HCT: 38 % (ref 36.0–46.0)
Hemoglobin: 12.2 g/dL (ref 12.0–15.0)
MCH: 30.7 pg (ref 26.0–34.0)
MCHC: 32.1 g/dL (ref 30.0–36.0)
MCV: 95.5 fL (ref 80.0–100.0)
Platelets: 230 K/uL (ref 150–400)
RBC: 3.98 MIL/uL (ref 3.87–5.11)
RDW: 13.9 % (ref 11.5–15.5)
WBC: 13.1 K/uL — ABNORMAL HIGH (ref 4.0–10.5)
nRBC: 0 % (ref 0.0–0.2)

## 2021-04-23 LAB — BASIC METABOLIC PANEL WITH GFR
Anion gap: 12 (ref 5–15)
BUN: 22 mg/dL (ref 8–23)
CO2: 22 mmol/L (ref 22–32)
Calcium: 8.9 mg/dL (ref 8.9–10.3)
Chloride: 104 mmol/L (ref 98–111)
Creatinine, Ser: 1.37 mg/dL — ABNORMAL HIGH (ref 0.44–1.00)
GFR, Estimated: 36 mL/min — ABNORMAL LOW (ref >60)
Glucose, Bld: 103 mg/dL — ABNORMAL HIGH (ref 70–99)
Potassium: 4.3 mmol/L (ref 3.5–5.1)
Sodium: 138 mmol/L (ref 135–145)

## 2021-04-23 LAB — GLUCOSE, CAPILLARY
Glucose-Capillary: 104 mg/dL — ABNORMAL HIGH (ref 70–99)
Glucose-Capillary: 108 mg/dL — ABNORMAL HIGH (ref 70–99)
Glucose-Capillary: 127 mg/dL — ABNORMAL HIGH (ref 70–99)
Glucose-Capillary: 140 mg/dL — ABNORMAL HIGH (ref 70–99)

## 2021-04-23 MED ORDER — SODIUM CHLORIDE 0.9 % IV SOLN: INTRAVENOUS | Status: DC

## 2021-04-23 NOTE — Progress Notes (Signed)
[facility] Progress Note  Patient: Kerry Carlson    [id number]  DOA: 04/21/2021    Date of Service: the patient was seen and examined on 04/23/2021  Brief hospital course: 88 year old female with past medical history significant for diabetes mellitus, hypothyroidism and hypertension and recent UTI (started on oral antibiotics but then cultures showed to be resistant) presented to the emergency room on 12/30 after mechanical fall and unable to stand.  Found to have a right-sided hip fracture and admitted to hospitalist service.  Started on IV Rocephin for UTI and given fact that she was on Xarelto, admission plan by orthopedic surgery to operate on Monday, 1/2.  Assessment and Plan: Cardiovascular and Mediastinum Hypertension Assessment & Plan Continue home medications.  Blood pressure stable, although a little elevated this morning.  Permanent atrial fibrillation [facility]) Assessment & Plan History of pacemaker.  Normally on Xarelto, which has been held.  Rate controlled.  Endocrine Diabetes mellitus without complication ([place]) Assessment & Plan Sliding scale.  CBGs have been stable.  Thyroid disease Assessment & Plan Continue Synthroid  Musculoskeletal and Integument * Closed right hip fracture, initial encounter [facility]) Assessment & Plan Xarelto held.  Seen by orthopedic surgery with plans for right hip hemiarthroplasty versus total hip replacement on Monday, 1/2.  Genitourinary AKI (acute kidney injury) ([place]) Assessment & Plan No previous history of renal disease although with advanced age, diabetes and hypertension, imagine she must have some chronic renal insufficiency.  Creatinine up from previous day, although lower than admission.  Last set of renal function labs are from 2018.  I suspect that she likely has stage IIIb chronic kidney disease.  Continue to follow.  Acute lower UTI Assessment & Plan Continue IV Rocephin x5 days.  Urine cultures pending.  White blood  cell count slightly increased to 13 today.  Unclear why, no fever.  No new signs of infection.  Repeat urinalysis unremarkable.  Rechecking CBC in the morning.    Body mass index is 23.89 kg/m.  Nutrition Problem: Increased nutrient needs Etiology: hip fracture     Consultants: Orthopedic surgery  Procedures: Planned right hip hemiarthroplasty  Antimicrobials: IV Rocephin 12/30-present  Code Status: DNR   Subjective: Patient with no complaints.  Doing well.  Objective: Vitals overall stable although at times, heart rate jumps up to above 100. Vitals:   04/23/21 0908 04/23/21 1121  BP: (!) 151/93 126/88  Pulse: 70 (!) 102  Resp: 16 18  Temp: 97.7 F (36.5 C) 98.3 F (36.8 C)  SpO2: 99% 90%    Intake/Output Summary (Last 24 hours) at 04/23/2021 2023 Last data filed at 04/23/2021 1800 Gross per 24 hour  Intake 798.58 ml  Output 1350 ml  Net -551.42 ml    There were no vitals filed for this visit. Body mass index is 23.89 kg/m.  Exam:  General: Alert and oriented x2, no acute distress HEENT: Normocephalic and atraumatic, mucous membranes are slightly dry Cardiovascular: Irregular rhythm, rate controlled Respiratory: Clear to auscultation bilaterally Abdomen: Soft, nontender, nondistended, positive bowel sounds Musculoskeletal: No clubbing or cyanosis or edema Skin: No skin breaks, tears or lesions Psychiatry: Appropriate, no evidence of psychoses Neurology: No focal deficits  Data Reviewed: My review of labs, imaging, notes and other tests shows no new significant findings.  Noted creatinine slightly up from previous day although still lower than on admission.  Also white count at 13.1 and yesterday was normal.  Disposition:  Status is: Inpatient  Remains inpatient appropriate because: Plans for surgery  on Monday, 1/2        Family Communication: Son and daughter-in-law at the bedside DVT Prophylaxis: SCDs Start: 04/21/21 1118   Xarelto on  hold   Author: Annita Brod ,MD 04/23/2021 8:23 PM  To reach On-call, see care teams to locate the attending and reach out via [email or web address]. Between 7PM-7AM, please contact night-coverage If you still have difficulty reaching the attending provider, please page the [facility] (Director on Call) for [facility] on amion for assistance.

## 2021-04-23 NOTE — Progress Notes (Signed)
Scanned meds and they didn't cross over. Had to manually input them.

## 2021-04-23 NOTE — Progress Notes (Signed)
Patient ID: Kerry Carlson, female   DOB: 1925/08/30, 88 y.o.   MRN: [id number] Pending surgery for hip fx. Was on Xarelto.  Surgery Monday by Dr. Ninfa Linden.  Family at bedside. Pt comfortable.

## 2021-04-24 ENCOUNTER — Encounter (HOSPITAL_COMMUNITY)
Admission: EM | Disposition: A | Discharge status: Skilled Nursing Facility | Payer: Self-pay | Source: Home / Self Care | Attending: Internal Medicine

## 2021-04-24 ENCOUNTER — Inpatient Hospital Stay (HOSPITAL_COMMUNITY): Payer: Medicare Other

## 2021-04-24 ENCOUNTER — Inpatient Hospital Stay (HOSPITAL_COMMUNITY): Payer: Medicare Other | Admitting: Anesthesiology

## 2021-04-24 DIAGNOSIS — S72001A Fracture of unspecified part of neck of right femur, initial encounter for closed fracture: Secondary | ICD-10-CM | POA: Diagnosis not present

## 2021-04-24 HISTORY — PX: TOTAL HIP ARTHROPLASTY: SHX124

## 2021-04-24 LAB — CBC
HCT: 36.4 % (ref 36.0–46.0)
Hemoglobin: 11.6 g/dL — ABNORMAL LOW (ref 12.0–15.0)
MCH: 30.3 pg (ref 26.0–34.0)
MCHC: 31.9 g/dL (ref 30.0–36.0)
MCV: 95 fL (ref 80.0–100.0)
Platelets: 218 10*3/uL (ref 150–400)
RBC: 3.83 MIL/uL — ABNORMAL LOW (ref 3.87–5.11)
RDW: 13.9 % (ref 11.5–15.5)
WBC: 9.1 10*3/uL (ref 4.0–10.5)
nRBC: 0 % (ref 0.0–0.2)

## 2021-04-24 LAB — GLUCOSE, CAPILLARY
Glucose-Capillary: 112 mg/dL — ABNORMAL HIGH (ref 70–99)
Glucose-Capillary: 157 mg/dL — ABNORMAL HIGH (ref 70–99)
Glucose-Capillary: 169 mg/dL — ABNORMAL HIGH (ref 70–99)

## 2021-04-24 LAB — BASIC METABOLIC PANEL
Anion gap: 9 (ref 5–15)
BUN: 19 mg/dL (ref 8–23)
CO2: 25 mmol/L (ref 22–32)
Calcium: 8.6 mg/dL — ABNORMAL LOW (ref 8.9–10.3)
Chloride: 104 mmol/L (ref 98–111)
Creatinine, Ser: 1.18 mg/dL — ABNORMAL HIGH (ref 0.44–1.00)
GFR, Estimated: 43 mL/min — ABNORMAL LOW (ref >60)
Glucose, Bld: 149 mg/dL — ABNORMAL HIGH (ref 70–99)
Potassium: 4.2 mmol/L (ref 3.5–5.1)
Sodium: 138 mmol/L (ref 135–145)

## 2021-04-24 SURGERY — ARTHROPLASTY, HIP, TOTAL, ANTERIOR APPROACH
Anesthesia: Monitor Anesthesia Care | Site: Hip | Laterality: Right

## 2021-04-24 MED ORDER — ACETAMINOPHEN 160 MG/5ML PO SOLN: 1000.0000 mg | Freq: Once | ORAL | Status: DC | PRN

## 2021-04-24 MED ORDER — METHOCARBAMOL 500 MG PO TABS: 500.0000 mg | ORAL_TABLET | Freq: Four times a day (QID) | ORAL | Status: DC | PRN

## 2021-04-24 MED ORDER — PROPOFOL 500 MG/50ML IV EMUL
INTRAVENOUS | Status: DC | PRN
  Administered 2021-04-24: 50 ug/kg/min via INTRAVENOUS

## 2021-04-24 MED ORDER — OXYCODONE HCL 5 MG PO TABS: 5.0000 mg | ORAL_TABLET | Freq: Once | ORAL | Status: DC | PRN

## 2021-04-24 MED ORDER — FENTANYL CITRATE (PF) 100 MCG/2ML IJ SOLN: 25.0000 ug | INTRAMUSCULAR | Status: DC | PRN

## 2021-04-24 MED ORDER — TRANEXAMIC ACID-NACL 1000-0.7 MG/100ML-% IV SOLN
INTRAVENOUS | Status: AC
  Filled 2021-04-24: qty 100

## 2021-04-24 MED ORDER — ACETAMINOPHEN 10 MG/ML IV SOLN: 1000.0000 mg | Freq: Once | INTRAVENOUS | Status: DC | PRN

## 2021-04-24 MED ORDER — ONDANSETRON HCL 4 MG PO TABS: 4.0000 mg | ORAL_TABLET | Freq: Four times a day (QID) | ORAL | Status: DC | PRN

## 2021-04-24 MED ORDER — TRAMADOL HCL 50 MG PO TABS
50.0000 mg | ORAL_TABLET | Freq: Four times a day (QID) | ORAL | Status: DC | PRN
  Administered 2021-04-24: 20:00:00 50 mg via ORAL
  Administered 2021-04-25 – 2021-04-26 (×2): 100 mg via ORAL
  Filled 2021-04-24: qty 2
  Filled 2021-04-24: qty 1
  Filled 2021-04-24: qty 2
  Filled 2021-04-24: qty 1

## 2021-04-24 MED ORDER — ACETAMINOPHEN 325 MG PO TABS: 325.0000 mg | ORAL_TABLET | Freq: Four times a day (QID) | ORAL | Status: DC | PRN

## 2021-04-24 MED ORDER — FENTANYL CITRATE (PF) 250 MCG/5ML IJ SOLN
INTRAMUSCULAR | Status: AC
  Filled 2021-04-24: qty 5

## 2021-04-24 MED ORDER — METHOCARBAMOL 1000 MG/10ML IJ SOLN: 500.0000 mg | Freq: Four times a day (QID) | INTRAVENOUS | Status: DC | PRN

## 2021-04-24 MED ORDER — SODIUM CHLORIDE 0.9 % IR SOLN
Status: DC | PRN
  Administered 2021-04-24: 1000 mL

## 2021-04-24 MED ORDER — SODIUM CHLORIDE 0.9 % IV SOLN: INTRAVENOUS | Status: DC

## 2021-04-24 MED ORDER — PHENYLEPHRINE 40 MCG/ML (10ML) SYRINGE FOR IV PUSH (FOR BLOOD PRESSURE SUPPORT)
PREFILLED_SYRINGE | INTRAVENOUS | Status: DC | PRN
Start: 2021-04-24 — End: 2021-04-24
  Administered 2021-04-24 (×2): 80 ug via INTRAVENOUS
  Administered 2021-04-24: 160 ug via INTRAVENOUS

## 2021-04-24 MED ORDER — DOCUSATE SODIUM 100 MG PO CAPS: 100.0000 mg | ORAL_CAPSULE | Freq: Two times a day (BID) | ORAL | Status: DC

## 2021-04-24 MED ORDER — CEFAZOLIN SODIUM-DEXTROSE 1-4 GM/50ML-% IV SOLN
1.0000 g | Freq: Four times a day (QID) | INTRAVENOUS | Status: AC
  Administered 2021-04-24 (×2): 1 g via INTRAVENOUS
  Filled 2021-04-24 (×2): qty 50

## 2021-04-24 MED ORDER — BUPIVACAINE IN DEXTROSE 0.75-8.25 % IT SOLN
INTRATHECAL | Status: DC | PRN
  Administered 2021-04-24: 1.8 mL via INTRATHECAL

## 2021-04-24 MED ORDER — TRANEXAMIC ACID-NACL 1000-0.7 MG/100ML-% IV SOLN
INTRAVENOUS | Status: DC | PRN
  Administered 2021-04-24: 1000 mg via INTRAVENOUS

## 2021-04-24 MED ORDER — METOCLOPRAMIDE HCL 5 MG PO TABS: 5.0000 mg | ORAL_TABLET | Freq: Three times a day (TID) | ORAL | Status: DC | PRN

## 2021-04-24 MED ORDER — RIVAROXABAN 15 MG PO TABS
15.0000 mg | ORAL_TABLET | Freq: Every day | ORAL | Status: DC
  Administered 2021-04-25 – 2021-04-26 (×2): 15 mg via ORAL
  Filled 2021-04-24 (×2): qty 1

## 2021-04-24 MED ORDER — 0.9 % SODIUM CHLORIDE (POUR BTL) OPTIME
TOPICAL | Status: DC | PRN
  Administered 2021-04-24: 1000 mL

## 2021-04-24 MED ORDER — PHENYLEPHRINE HCL-NACL 20-0.9 MG/250ML-% IV SOLN
INTRAVENOUS | Status: DC | PRN
  Administered 2021-04-24: 20 ug/min via INTRAVENOUS

## 2021-04-24 MED ORDER — CEFAZOLIN SODIUM-DEXTROSE 2-4 GM/100ML-% IV SOLN
INTRAVENOUS | Status: AC
  Filled 2021-04-24: qty 100

## 2021-04-24 MED ORDER — MORPHINE SULFATE (PF) 2 MG/ML IV SOLN
0.5000 mg | INTRAVENOUS | Status: DC | PRN
  Administered 2021-04-24 – 2021-04-25 (×2): 1 mg via INTRAVENOUS
  Filled 2021-04-24 (×2): qty 1

## 2021-04-24 MED ORDER — FENTANYL CITRATE (PF) 250 MCG/5ML IJ SOLN
INTRAMUSCULAR | Status: DC | PRN
Start: 2021-04-24 — End: 2021-04-24
  Administered 2021-04-24: 25 ug via INTRAVENOUS

## 2021-04-24 MED ORDER — HYDROCODONE-ACETAMINOPHEN 7.5-325 MG PO TABS
1.0000 | ORAL_TABLET | ORAL | Status: DC | PRN
  Filled 2021-04-24: qty 2

## 2021-04-24 MED ORDER — PROPOFOL 10 MG/ML IV BOLUS
INTRAVENOUS | Status: DC | PRN
  Administered 2021-04-24: 20 mg via INTRAVENOUS

## 2021-04-24 MED ORDER — METOCLOPRAMIDE HCL 5 MG/ML IJ SOLN
5.0000 mg | Freq: Three times a day (TID) | INTRAMUSCULAR | Status: DC | PRN
  Filled 2021-04-24: qty 2

## 2021-04-24 MED ORDER — ACETAMINOPHEN 500 MG PO TABS: 1000.0000 mg | ORAL_TABLET | Freq: Once | ORAL | Status: DC | PRN

## 2021-04-24 MED ORDER — HYDROCODONE-ACETAMINOPHEN 5-325 MG PO TABS
1.0000 | ORAL_TABLET | ORAL | Status: DC | PRN
  Administered 2021-04-24 (×2): 2 via ORAL
  Filled 2021-04-24 (×2): qty 2

## 2021-04-24 MED ORDER — MENTHOL 3 MG MT LOZG: 1.0000 | LOZENGE | OROMUCOSAL | Status: DC | PRN

## 2021-04-24 MED ORDER — FENTANYL CITRATE (PF) 100 MCG/2ML IJ SOLN
INTRAMUSCULAR | Status: AC
  Administered 2021-04-24: 25 ug via INTRAVENOUS
  Filled 2021-04-24: qty 2

## 2021-04-24 MED ORDER — DIPHENHYDRAMINE HCL 12.5 MG/5ML PO ELIX: 12.5000 mg | ORAL_SOLUTION | ORAL | Status: DC | PRN

## 2021-04-24 MED ORDER — PHENOL 1.4 % MT LIQD: 1.0000 | OROMUCOSAL | Status: DC | PRN

## 2021-04-24 MED ORDER — LACTATED RINGERS IV SOLN
INTRAVENOUS | Status: DC | PRN
Start: 2021-04-24 — End: 2021-04-24

## 2021-04-24 MED ORDER — CEFAZOLIN SODIUM-DEXTROSE 2-3 GM-%(50ML) IV SOLR
INTRAVENOUS | Status: DC | PRN
Start: 2021-04-24 — End: 2021-04-24
  Administered 2021-04-24: 2 g via INTRAVENOUS

## 2021-04-24 MED ORDER — OXYCODONE HCL 5 MG/5ML PO SOLN: 5.0000 mg | Freq: Once | ORAL | Status: DC | PRN

## 2021-04-24 MED ORDER — CHLORHEXIDINE GLUCONATE CLOTH 2 % EX PADS
6.0000 | MEDICATED_PAD | Freq: Every day | CUTANEOUS | Status: DC
  Administered 2021-04-25 – 2021-04-26 (×2): 6 via TOPICAL

## 2021-04-24 MED ORDER — ONDANSETRON HCL 4 MG/2ML IJ SOLN: 4.0000 mg | Freq: Four times a day (QID) | INTRAMUSCULAR | Status: DC | PRN

## 2021-04-24 SURGICAL SUPPLY — 55 items
APL SKNCLS STERI-STRIP NONHPOA (GAUZE/BANDAGES/DRESSINGS) ×1
BAG COUNTER SPONGE SURGICOUNT (BAG) ×2 IMPLANT
BAG SPNG CNTER NS LX DISP (BAG) ×1
BENZOIN TINCTURE PRP APPL 2/3 (GAUZE/BANDAGES/DRESSINGS) ×2 IMPLANT
BIPOLAR PROS AML 46 (Hips) ×2 IMPLANT
BLADE CLIPPER SURG (BLADE) IMPLANT
BLADE SAW SGTL 18X1.27X75 (BLADE) ×2 IMPLANT
COVER SURGICAL LIGHT HANDLE (MISCELLANEOUS) ×2 IMPLANT
DRAPE C-ARM 42X72 X-RAY (DRAPES) ×2 IMPLANT
DRAPE STERI IOBAN 125X83 (DRAPES) ×2 IMPLANT
DRAPE U-SHAPE 47X51 STRL (DRAPES) ×6 IMPLANT
DRSG AQUACEL AG ADV 3.5X10 (GAUZE/BANDAGES/DRESSINGS) ×2 IMPLANT
DRSG MEPILEX BORDER 4X8 (GAUZE/BANDAGES/DRESSINGS) ×1 IMPLANT
DURAPREP 26ML APPLICATOR (WOUND CARE) ×2 IMPLANT
ELECT BLADE 4.0 EZ CLEAN MEGAD (MISCELLANEOUS) ×2
ELECT BLADE 6.5 EXT (BLADE) IMPLANT
ELECT REM PT RETURN 9FT ADLT (ELECTROSURGICAL) ×2
ELECTRODE BLDE 4.0 EZ CLN MEGD (MISCELLANEOUS) ×1 IMPLANT
ELECTRODE REM PT RTRN 9FT ADLT (ELECTROSURGICAL) ×1 IMPLANT
FACESHIELD WRAPAROUND (MASK) ×6 IMPLANT
FACESHIELD WRAPAROUND OR TEAM (MASK) ×2 IMPLANT
GLOVE SRG 8 PF TXTR STRL LF DI (GLOVE) ×2 IMPLANT
GLOVE SURG LTX SZ8 (GLOVE) ×2 IMPLANT
GLOVE SURG ORTHO LTX SZ7.5 (GLOVE) ×4 IMPLANT
GLOVE SURG UNDER POLY LF SZ8 (GLOVE) ×4
GOWN STRL REUS W/ TWL LRG LVL3 (GOWN DISPOSABLE) ×2 IMPLANT
GOWN STRL REUS W/ TWL XL LVL3 (GOWN DISPOSABLE) ×2 IMPLANT
GOWN STRL REUS W/TWL LRG LVL3 (GOWN DISPOSABLE) ×4
GOWN STRL REUS W/TWL XL LVL3 (GOWN DISPOSABLE) ×4
HANDPIECE INTERPULSE COAX TIP (DISPOSABLE) ×4
HEAD BIPOLAR PROS AML 46 (Hips) IMPLANT
HEAD FEM STD 28X+1.5 STRL (Hips) ×1 IMPLANT
KIT BASIN OR (CUSTOM PROCEDURE TRAY) ×2 IMPLANT
KIT TURNOVER KIT B (KITS) ×2 IMPLANT
MANIFOLD NEPTUNE II (INSTRUMENTS) ×2 IMPLANT
NS IRRIG 1000ML POUR BTL (IV SOLUTION) ×2 IMPLANT
PACK TOTAL JOINT (CUSTOM PROCEDURE TRAY) ×2 IMPLANT
PAD ARMBOARD 7.5X6 YLW CONV (MISCELLANEOUS) ×2 IMPLANT
SET HNDPC FAN SPRY TIP SCT (DISPOSABLE) ×1 IMPLANT
STAPLER VISISTAT 35W (STAPLE) IMPLANT
STEM CORAIL KA10 (Stem) ×1 IMPLANT
SUT ETHIBOND NAB CT1 #1 30IN (SUTURE) ×3 IMPLANT
SUT MNCRL AB 4-0 PS2 18 (SUTURE) ×1 IMPLANT
SUT VIC AB 0 CT1 27 (SUTURE) ×2
SUT VIC AB 0 CT1 27XBRD ANBCTR (SUTURE) ×1 IMPLANT
SUT VIC AB 1 CT1 27 (SUTURE) ×4
SUT VIC AB 1 CT1 27XBRD ANBCTR (SUTURE) ×1 IMPLANT
SUT VIC AB 2-0 CT1 27 (SUTURE) ×4
SUT VIC AB 2-0 CT1 TAPERPNT 27 (SUTURE) ×1 IMPLANT
TOWEL GREEN STERILE (TOWEL DISPOSABLE) ×2 IMPLANT
TOWEL GREEN STERILE FF (TOWEL DISPOSABLE) ×2 IMPLANT
TRAY CATH 16FR W/PLASTIC CATH (SET/KITS/TRAYS/PACK) IMPLANT
TRAY FOLEY W/BAG SLVR 16FR (SET/KITS/TRAYS/PACK)
TRAY FOLEY W/BAG SLVR 16FR ST (SET/KITS/TRAYS/PACK) IMPLANT
WATER STERILE IRR 1000ML POUR (IV SOLUTION) ×4 IMPLANT

## 2021-04-24 NOTE — Progress Notes (Signed)
Patient ID: Kerry Carlson, female   DOB: 08/02/1925, 88 y.o.   MRN: [id number] The patient is scheduled this morning for a right partial versus total hip replacement to treat her right hip acute femoral neck fracture.  Her last dose of Xarelto was on Thursday so she has been off of a blood thinning medication for a total of 3 days.  Her vital signs are stable and her labs are stable.  Informed consent has been obtained and the right operative hip has been marked.

## 2021-04-24 NOTE — Plan of Care (Signed)

## 2021-04-24 NOTE — Hospital Course (Addendum)
88 year old female with past medical history significant for diabetes mellitus, hypothyroidism and hypertension and recent UTI (started on oral antibiotics but then cultures showed to be resistant) presented to the emergency room on 12/30 after mechanical fall and unable to stand.  Found to have a right-sided hip fracture and admitted to hospitalist service.  Started on IV Rocephin for UTI.  Xarelto held with plan by orthopedic surgery to operate on Monday, 1/2.

## 2021-04-24 NOTE — Plan of Care (Signed)
  Problem: Education: Goal: Knowledge of General Education information will improve Description: Including pain rating scale, medication(s)/side effects and non-pharmacologic comfort measures Outcome: Progressing   Problem: Health Behavior/Discharge Planning: Goal: Ability to manage health-related needs will improve Outcome: Progressing   Problem: Clinical Measurements: Goal: Ability to maintain clinical measurements within normal limits will improve Outcome: Progressing Goal: Will remain free from infection Outcome: Progressing Goal: Diagnostic test results will improve Outcome: Progressing Goal: Respiratory complications will improve Outcome: Progressing   Problem: Activity: Goal: Risk for activity intolerance will decrease Outcome: Progressing   Problem: Nutrition: Goal: Adequate nutrition will be maintained Outcome: Progressing   Problem: Coping: Goal: Level of anxiety will decrease Outcome: Progressing   Problem: Elimination: Goal: Will not experience complications related to bowel motility Outcome: Progressing   Problem: Pain Managment: Goal: General experience of comfort will improve Outcome: Progressing   Problem: Safety: Goal: Ability to remain free from injury will improve Outcome: Progressing   Problem: Skin Integrity: Goal: Risk for impaired skin integrity will decrease Outcome: Progressing   

## 2021-04-24 NOTE — Transfer of Care (Signed)
Immediate Anesthesia Transfer of Care Note  Patient: Kerry Carlson  Procedure(s) Performed: PARTIAL vs. TOTAL HIP ARTHROPLASTY ANTERIOR APPROACH (Right: Hip)  Patient Location: PACU  Anesthesia Type:Spinal  Level of Consciousness: drowsy and patient cooperative  Airway & Oxygen Therapy: Patient Spontanous Breathing  Post-op Assessment: Report given to RN and Post -op Vital signs reviewed and stable  Post vital signs: Reviewed and stable  Last Vitals:  Vitals Value Taken Time  BP 127/67 04/24/21 0941  Temp    Pulse 86   Resp 16 04/24/21 0941  SpO2 99   Vitals shown include unvalidated device data.  Last Pain:  Vitals:   04/24/21 0542  TempSrc: Oral  PainSc:          Complications: No notable events documented.

## 2021-04-24 NOTE — TOC CAGE-AID Note (Signed)
Transition of Care [facility]) - CAGE-AID Screening   Patient Details  Name: Kerry Carlson MRN: [id number] Date of Birth: 1925-10-06  Clinical Narrative:  Patient denies alcohol or drug use, no need for resources at this time.  CAGE-AID Screening:    Have You Ever Felt You Ought to Cut Down on Your Drinking or Drug Use?: No Have People Annoyed You By Critizing Your Drinking Or Drug Use?: No Have You Felt Bad Or Guilty About Your Drinking Or Drug Use?: No Have You Ever Had a Drink or Used Drugs First Thing In The Morning to Steady Your Nerves or to Get Rid of a Hangover?: No CAGE-AID Score: 0  Substance Abuse Education Offered: No

## 2021-04-24 NOTE — Assessment & Plan Note (Addendum)
Resolved.  ?some degree of CKD given advanced age, diabetes and hypertension Creatinine improved 1.37>>1.18>>1.07 today.   Last renal function labs were from 2018, no recent baseline.   --Monitor BMP --Continue gentle IV hydration until PO intake improves

## 2021-04-24 NOTE — Anesthesia Procedure Notes (Signed)
Spinal  Patient location during procedure: OR Start time: 04/24/2021 7:54 AM End time: 04/24/2021 8:01 AM Reason for block: surgical anesthesia Staffing Performed: anesthesiologist  Anesthesiologist: Val Eagle, MD Preanesthetic Checklist Completed: patient identified, IV checked, risks and benefits discussed, surgical consent, monitors and equipment checked, pre-op evaluation and timeout performed Spinal Block Patient position: right lateral decubitus Prep: DuraPrep Patient monitoring: heart rate, cardiac monitor, continuous pulse ox and blood pressure Approach: midline Location: L4-5 Injection technique: single-shot Needle Needle type: Pencan  Needle gauge: 24 G Needle length: 9 cm Assessment Sensory level: T6 Events: CSF return

## 2021-04-24 NOTE — Progress Notes (Signed)
Spoke w/ Jinny Blossom about surgery time.   OR called and stated surgery will be @0730  and transport should be here about 630-645. Family is aware of times.

## 2021-04-24 NOTE — Anesthesia Preprocedure Evaluation (Addendum)
Anesthesia Evaluation  Patient identified by MRN, date of birth, ID band Patient awake    Reviewed: Allergy & Precautions, NPO status , Patient's Chart, lab work & pertinent test results, reviewed documented beta blocker date and time   History of Anesthesia Complications Negative for: history of anesthetic complications  Airway Mallampati: III  TM Distance: <3 FB Neck ROM: Full    Dental  (+) Upper Dentures, Lower Dentures   Pulmonary neg pulmonary ROS,    breath sounds clear to auscultation       Cardiovascular hypertension, Pt. on medications and Pt. on home beta blockers + dysrhythmias Atrial Fibrillation + pacemaker  Rhythm:Irregular     Neuro/Psych negative neurological ROS  negative psych ROS   GI/Hepatic negative GI ROS, Neg liver ROS,   Endo/Other  diabetesHypothyroidism   Renal/GU Renal InsufficiencyRenal disease     Musculoskeletal  Right Hip Fx   Abdominal   Peds  Hematology xarelto last dose 12/29   Anesthesia Other Findings   Reproductive/Obstetrics                             Anesthesia Physical Anesthesia Plan  ASA: 3  Anesthesia Plan: MAC and Spinal   Post-op Pain Management:    Induction: Intravenous  PONV Risk Score and Plan: 2 and Propofol infusion and Treatment may vary due to age or medical condition  Airway Management Planned: Nasal Cannula  Additional Equipment: None  Intra-op Plan:   Post-operative Plan:   Informed Consent: I have reviewed the patients History and Physical, chart, labs and discussed the procedure including the risks, benefits and alternatives for the proposed anesthesia with the patient or authorized representative who has indicated his/her understanding and acceptance.   Patient has DNR.  Discussed DNR with power of attorney and Suspend DNR.   Dental advisory given  Plan Discussed with: CRNA and Anesthesiologist  Anesthesia Plan  Comments:        Anesthesia Quick Evaluation

## 2021-04-24 NOTE — Progress Notes (Signed)
°  Progress Note   Patient: Kerry Carlson [id number] DOB: [birth year] DOA: 04/21/2021     3 DOS: the patient was seen and examined on 04/24/2021   Brief hospital course: 88 year old female with past medical history significant for diabetes mellitus, hypothyroidism and hypertension and recent UTI (started on oral antibiotics but then cultures showed to be resistant) presented to the emergency room on 12/30 after mechanical fall and unable to stand.  Found to have a right-sided hip fracture and admitted to hospitalist service.  Started on IV Rocephin for UTI.  Xarelto held with plan by orthopedic surgery to operate on Monday, 1/2.  Assessment and Plan * Closed right hip fracture, initial encounter [facility])- (present on admission) Xarelto on hold for surgery.   Orthopedic surgery following. Taken to OR this AM, doing well post-op when seen.  Status post right hip hemiarthroplasty. --resume Xarelto when ok w/ ortho --PT/OT --anticipate need for rehab  --pain control and bowel regimen  AKI (acute kidney injury) ([place])- (present on admission) Possible some degree of CKD given advanced age, diabetes and hypertension Creatinine improved 1.37>>1.18 today.   Last set of renal function labs are from 2018.   Suspect that she likely has stage IIIb chronic kidney disease.   --Monitor BMP  Acute lower UTI- (present on admission) --Continue IV Rocephin x5 days.   Urine cultures pending.   WBC had spiked to 13 on 1/1, but normalized today.  No fevers.  Repeat urinalysis unremarkable.   --CBC in AM  DNR (do not resuscitate)- (present on admission) Code status was discussed on admission with the patient and her son and  they are in agreement that the patient would not desire resuscitation and would prefer to die a natural death should that situation arise. --will need a gold out of facility DNR form at the time of discharge  Thyroid disease- (present on admission) -Continue Synthroid  Hypertension-  (present on admission) -Continue Atenolol  Hyperlipidemia- (present on admission) --Continue Pravastatin  Diabetes mellitus without complication ([place]) Sliding scale Novolog. CBG's at goal.  Permanent atrial fibrillation ([place])- (present on admission) -Rate controlled with atenolol -Xarelto to resume per ortho, held for surgery     Subjective: Pt seen in room after surgery, daughter at bedside.  Pt still groggy from sedation, but awake and tries to converse.  Reports pain controlled, has dry mouth, notices shaking in her arms, daughter attributes to anesthesia.    Objective Vitals notable for 2 L/min supplemental O2, BP mildly elevated, no fevers  General exam: awake, alert, no acute distress HEENT: dry mucus membranes, hearing grossly normal  Respiratory system: CTAB, no wheezes, rales or rhonchi, normal respiratory effort. Cardiovascular system: normal S1/S2, RRR, no pedal edema.   Central nervous system:drowsy but oriented. no gross focal neurologic deficits, normal speech mildly slurred due to sedation, CN's grossly intact Extremities: DP pulses intact, distal LE"s warm and no cyanosis Skin: dry, intact, normal temperature Psychiatry: normal mood, congruent affect  Data Reviewed: Labs notable for glucose 149  on BMP, Hbg 11.6  Family Communication: daughter at bedside on rounds today 1/2  Disposition: Status is: Inpatient  Remains inpatient appropriate because: s/p surgery for hip fracture this AM, pending PT/OT evaluations for disposition planning         Time spent: 25 minutes  Author: Ezekiel Slocumb 04/24/2021 4:22 PM  For on call review [email or web address].

## 2021-04-24 NOTE — Anesthesia Postprocedure Evaluation (Signed)
Anesthesia Post Note  Patient: [name]  Procedure(s) Performed: PARTIAL vs. TOTAL HIP ARTHROPLASTY ANTERIOR APPROACH (Right: Hip)     Patient location during evaluation: PACU Anesthesia Type: MAC and Spinal Level of consciousness: patient cooperative and awake Pain management: pain level controlled Vital Signs Assessment: post-procedure vital signs reviewed and stable Respiratory status: spontaneous breathing, nonlabored ventilation, respiratory function stable and patient connected to nasal cannula oxygen Cardiovascular status: stable and blood pressure returned to baseline Postop Assessment: no apparent nausea or vomiting Anesthetic complications: no   No notable events documented.  Last Vitals:  Vitals:   04/24/21 1020 04/24/21 1056  BP: (!) 140/92 (!) 161/75  Pulse:  (!) 25  Resp: 13 16  Temp: 36.5 C   SpO2:  100%    Last Pain:  Vitals:   04/24/21 0940  TempSrc:   PainSc: Asleep                 Verlisa Vara

## 2021-04-24 NOTE — Brief Op Note (Signed)
04/21/2021 - 04/24/2021  9:27 AM  PATIENT:  Kerry Carlson  88 y.o. female  PRE-OPERATIVE DIAGNOSIS:  Right Hip Fx  POST-OPERATIVE DIAGNOSIS:  Right Hip Fx  PROCEDURE:  Right Hip Bipolar Hemiarthroplasty (Direct Anterior)  SURGEON:  Surgeon(s) and Role:    Mcarthur Rossetti, MD - Primary  PHYSICIAN ASSISTANT:  Benita Stabile, PA-C  ANESTHESIA:   spinal  EBL:  200 mL   COUNTS:  YES  DICTATION: .Other Dictation: Dictation Number [id number]  PLAN OF CARE: Admit to inpatient   PATIENT DISPOSITION:  PACU - hemodynamically stable.   Delay start of Pharmacological VTE agent (>24hrs) due to surgical blood loss or risk of bleeding: no

## 2021-04-24 NOTE — Op Note (Signed)
NAME: Kerry Carlson, Kerry Carlson MEDICAL RECORD NO: [id number] ACCOUNT NO: [id number] DATE OF BIRTH: November 29, 1925 FACILITY: [facility] LOCATION: [facility] PHYSICIAN: Vanita Panda. Magnus Ivan, MD  Operative Report   DATE OF PROCEDURE: 04/24/2021  PREOPERATIVE DIAGNOSIS:  Displaced right hip femoral neck fracture.  POSTOPERATIVE DIAGNOSIS:  Displaced right hip femoral neck fracture.  PROCEDURE:  Right anterior hip hemiarthroplasty.  IMPLANTS:  DePuy size 10 Corail femoral component with a ____ bipolar metal hip ball.  SURGEON:  Doneen Poisson, MD  ASSISTANT:  Richardean Canal, PA-C  ANESTHESIA:  Spinal.  ANTIBIOTICS:  2 g IV Ancef.  BLOOD LOSS:  200 mL.  COMPLICATIONS:  None.  INDICATIONS:  The patient is a very pleasant 88 year old female with some mild dementia.  She had a mechanical fall early Friday morning at home.  She was brought to the [facility] emergency room and found to have a displaced right hip femoral neck  fracture.  She has been on Xarelto for chronic cardiac issues.  She now presents for definitive fixation with a hemiarthroplasty of her right hip after 3 days of being off Xarelto.  We felt this was the safest time in order to proceed with spinal  anesthesia.  She has had a hip block on Friday and that has helped her quite a bit.  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  We talked about a hemi versus total hip.  She has had no previous hip disease and does shuffle with her gait, but we felt that hemiarthroplasty as an anterior approach would be a better alternative.  DESCRIPTION OF PROCEDURE:  After informed consent was obtained, appropriate right hip was marked.  She was brought to the operating room where spinal anesthesia was obtained, a Foley catheter was placed and traction boots were placed on both her feet.   She was then  placed supine on the Hana fracture table with the perineal post in place and both legs in line skeletal traction device and no traction applied.  We did assess her right hip radiographically.  We then prepped it with DuraPrep and sterile  drapes.  A timeout was called and she was identified as correct patient, correct right hip.  She was given tranexamic acid as well.  We then made an incision just inferior and posterior to the anterior superior iliac spine, and dissected down to the  tensor fascia lata muscle.  The tensor fascia was then divided longitudinally to proceed with direct anterior approach to the hip.  We identified and cauterized circumflex vessels, then identified the hip capsule, opened the hip capsule and found a  hemarthrosis consistent with a femoral neck fracture and you could see she had a completely displaced femoral neck fracture.  We placed curved retractors around the medial and lateral femoral neck and made a freshening cut just distal to the fracture and  proximal to the lesser trochanter with an oscillating saw.  We completed this with an osteotome and removed the femoral head in its entirety.  We then trialed for a size 46 bipolar head.  We removed any other debris from within the hip acetabulum  itself.  Attention was then turned to the femur.  With the leg externally rotated to 120 degrees, extended and adducted, we were able to place a Mueller retractor medially and Hohmann retractor behind the greater trochanter, released lateral joint  capsule and used a box cutting osteotome to enter femoral canal and a rongeur to lateralize, then began broaching using the Corail femoral component broaching system from DePuy and broaching from a size 8 going up to size 10. With the size 10 in placed,  we trialed a standard offset femoral neck and then our 46/28+1.5 bipolar hip ball, reduced this in acetabulum.  We were pleased with range of motion and stability as well as offset assessed  radiographically and clinically.  We then dislocated the hip,  removed the trial components.  We placed the real Corail femoral component, size 10 with standard offset and the real 46/28+1.5 metal bipolar hip ball, again reduced this in acetabulum and it was stable and again we assessed radiographically and  clinically.  We then irrigated the soft tissue with normal saline solution.  The joint capsule was closed with interrupted #1 Ethibond suture followed by #1 Vicryl to close the tensor fascia.  0 Vicryl was used to close deep tissue and 2-0 Vicryl was  used to close subcutaneous tissue.  Staples were used to close the skin.  A well-padded sterile dressing was applied.  She was taken off the Hana table and taken to recovery room in stable condition with all final counts being correct and no  complications noted.  Of note, Rexene Edison, PA-C did assist in entire case and assistance was crucial for facilitating every aspect of this case.   NIK D: 04/24/2021 9:24:52 am T: 04/24/2021 10:27:00 am  JOB: [id number]/ [id number]

## 2021-04-25 LAB — GLUCOSE, CAPILLARY
Glucose-Capillary: 123 mg/dL — ABNORMAL HIGH (ref 70–99)
Glucose-Capillary: 129 mg/dL — ABNORMAL HIGH (ref 70–99)
Glucose-Capillary: 127 mg/dL — ABNORMAL HIGH (ref 70–99)
Glucose-Capillary: 111 mg/dL — ABNORMAL HIGH (ref 70–99)
Glucose-Capillary: 124 mg/dL — ABNORMAL HIGH (ref 70–99)

## 2021-04-25 LAB — BASIC METABOLIC PANEL WITH GFR
Anion gap: 7 (ref 5–15)
BUN: 13 mg/dL (ref 8–23)
CO2: 25 mmol/L (ref 22–32)
Calcium: 8.4 mg/dL — ABNORMAL LOW (ref 8.9–10.3)
Chloride: 104 mmol/L (ref 98–111)
Creatinine, Ser: 1.07 mg/dL — ABNORMAL HIGH (ref 0.44–1.00)
GFR, Estimated: 48 mL/min — ABNORMAL LOW
Glucose, Bld: 163 mg/dL — ABNORMAL HIGH (ref 70–99)
Potassium: 4.4 mmol/L (ref 3.5–5.1)
Sodium: 136 mmol/L (ref 135–145)

## 2021-04-25 LAB — CBC
HCT: 33.4 % — ABNORMAL LOW (ref 36.0–46.0)
Hemoglobin: 11 g/dL — ABNORMAL LOW (ref 12.0–15.0)
MCH: 31.4 pg (ref 26.0–34.0)
MCHC: 32.9 g/dL (ref 30.0–36.0)
MCV: 95.4 fL (ref 80.0–100.0)
Platelets: 199 10*3/uL (ref 150–400)
RBC: 3.5 MIL/uL — ABNORMAL LOW (ref 3.87–5.11)
RDW: 13.8 % (ref 11.5–15.5)
WBC: 9.5 10*3/uL (ref 4.0–10.5)
nRBC: 0 % (ref 0.0–0.2)

## 2021-04-25 LAB — MAGNESIUM: Magnesium: 2 mg/dL (ref 1.7–2.4)

## 2021-04-25 MED ORDER — OXYCODONE HCL 5 MG PO TABS: 5.0000 mg | ORAL_TABLET | ORAL | Status: DC | PRN

## 2021-04-25 MED ORDER — HYDRALAZINE HCL 25 MG PO TABS: 25.0000 mg | ORAL_TABLET | Freq: Three times a day (TID) | ORAL | Status: DC | PRN

## 2021-04-25 MED ORDER — ACETAMINOPHEN 500 MG PO TABS
1000.0000 mg | ORAL_TABLET | Freq: Three times a day (TID) | ORAL | Status: DC
  Administered 2021-04-25 – 2021-04-26 (×4): 1000 mg via ORAL
  Filled 2021-04-25 (×5): qty 2

## 2021-04-25 NOTE — Plan of Care (Signed)

## 2021-04-25 NOTE — TOC Initial Note (Signed)
Transition of Care [facility]) - Initial/Assessment Note    Patient Details  Name: Kerry Carlson MRN: [id number] Date of Birth: 13-Apr-1926  Transition of Care [facility]) CM/SW Contact:    Kerry Ingles, RN Phone Number:[phone]  04/25/2021, 11:48 AM  Clinical Narrative:                 TOC consulted for disposition needs. Cm at bedside to speak with patient and daughter in law at bedside. Patient is sleepy and daughter in law mostly answers questions. Patient is from home and she lives with her son and adult grandchildren. Daughter in law says that the family has been caring for patient for the past two years but they think they are at the point where she will needs a little rehab before she can return home. Patient is established with DR. Tisovec for PCP. Patient has transportation to appointments and pharmacy. Daughter in law states that she has no problems obtaining meds and is compliant with her medication regimen. Patient has standard walker , shower chair and lift chair at home. Patient has no other DME or Home health therapies or assistance. TOC will continue to follow and workup according to therapy recommendations. Daughter in law states that therapy has recommended SNF. Daughter in law informed that [facility] will initiate workup after therapy documents recommendations. TOC will continue to follow for any disposition needs.   Expected Discharge Plan: Skilled Nursing Facility Barriers to Discharge: Continued Medical Work up   Patient Goals and CMS Choice Patient states their goals for this hospitalization and ongoing recovery are:: Patient unable to verbalize her goal. Daughter in law at bedside states that all the fa,ily just wants her to get stronger to come back home.   Choice offered to / list presented to : NA  Expected Discharge Plan and Services Expected Discharge Plan: [place]   Discharge Planning Services: CM Consult   Living arrangements for the past 2 months:  Single Family Home                 DME Arranged: N/A DME Agency: NA       HH Arranged: NA          Prior Living Arrangements/Services Living arrangements for the past 2 months: Single Family Home Lives with:: Adult Children, Other (Comment) (and grandchildren) Patient language and need for interpreter reviewed:: Yes Do you feel safe going back to the place where you live?: Yes      Need for Family Participation in Patient Care: Yes (Comment) Care giver support system in place?: Yes (comment) Current home services:  (none) Criminal Activity/Legal Involvement Pertinent to Current Situation/Hospitalization: No - Comment as needed  Activities of Daily Living Home Assistive Devices/Equipment: Walker (specify type) ADL Screening (condition at time of admission) Patient's cognitive ability adequate to safely complete daily activities?: Yes Is the patient deaf or have difficulty hearing?: Yes Does the patient have difficulty seeing, even when wearing glasses/contacts?: No Does the patient have difficulty concentrating, remembering, or making decisions?: No Patient able to express need for assistance with ADLs?: Yes Does the patient have difficulty dressing or bathing?: No Independently performs ADLs?: Yes (appropriate for developmental age) Does the patient have difficulty walking or climbing stairs?: Yes Weakness of Legs: Both Weakness of Arms/Hands: None  Permission Sought/Granted                  Emotional Assessment Appearance:: Appears stated age Attitude/Demeanor/Rapport: Lethargic Affect (typically observed): Quiet Orientation: : Oriented  to Self, Oriented to Place Alcohol / Substance Use: Not Applicable Psych Involvement: No (comment)  Admission diagnosis:  Fall [W19.XXXA] Closed right hip fracture, initial encounter ([place]) [S72.001A] Patient Active Problem List   Diagnosis Date Noted   AKI (acute kidney injury) ([place]) 04/22/2021   Closed right hip fracture,  initial encounter ([place]) 04/21/2021   Diabetes mellitus without complication ([place]) [id number]   Hyperlipidemia 04/21/2021   Hypertension 04/21/2021   Thyroid disease 04/21/2021   DNR (do not resuscitate) 04/21/2021   Acute lower UTI 04/21/2021   Encounter for care of pacemaker 05/23/2020   Pacemaker - Medtronic Azure single chamber pacemaker 07/12/2016. 05/23/2020   Second degree Mobitz II AV block 05/23/2020   Permanent atrial fibrillation ([place]) 05/23/2020   PCP:  Haywood Pao, MD Pharmacy:   [facility] [place], [place] - [place] AT [facility] [address] [place] [address] Phone: [phone] Fax: [phone]     Social Determinants of Health (SDOH) Interventions    Readmission Risk Interventions No flowsheet data found.

## 2021-04-25 NOTE — Progress Notes (Signed)
°  Progress Note   Patient: Kerry Carlson [id number] DOB: 02/28/1926 DOA: 04/21/2021     4 DOS: the patient was seen and examined on 04/25/2021   Brief hospital course: 88 year old female with past medical history significant for diabetes mellitus, hypothyroidism and hypertension and recent UTI (started on oral antibiotics but then cultures showed to be resistant) presented to the emergency room on 12/30 after mechanical fall and unable to stand.  Found to have a right-sided hip fracture and admitted to hospitalist service.  Started on IV Rocephin for UTI.  Xarelto held with plan by orthopedic surgery to operate on Monday, 1/2.  Assessment and Plan * Closed right hip fracture, initial encounter [facility])- (present on admission) Orthopedic surgery following. Xarelto held on admission, resumed post-op. Status post right hip hemiarthroplasty on 04/24/21. --resume Xarelto when ok w/ ortho --PT/OT evaluations --anticipate need for rehab  --pain control and bowel regimen --scheduled Tylenol to minimize use of opioids given her confusion and somnolence today  AKI (acute kidney injury) ([place])- (present on admission) Resolved.  ?some degree of CKD given advanced age, diabetes and hypertension Creatinine improved 1.37>>1.18>>1.07 today.   Last renal function labs were from 2018, no recent baseline.   --Monitor BMP --Continue gentle IV hydration until PO intake improves  Acute lower UTI- (present on admission) Completed IV Rocephin x 3 days.   Urine cultures appear not sent.   WBC spiked to 13 on 1/1, but normalized.   No fevers.  Repeat urinalysis unremarkable.   --CBC in AM  DNR (do not resuscitate)- (present on admission) Code status was discussed on admission with the patient and her son and  they are in agreement that the patient would not desire resuscitation and would prefer to die a natural death should that situation arise. --will need a gold out of facility DNR form at the time of  discharge  Thyroid disease- (present on admission) -Continue Synthroid  Hypertension- (present on admission) BP's elevated today, suspect due to pain -Continue Atenolol -Adequate pain control --PRN oral hydralazine for SBP>160  Hyperlipidemia- (present on admission) --Continue Pravastatin  Diabetes mellitus without complication (HCC) Sliding scale Novolog. CBG's at goal.  Permanent atrial fibrillation ([place])- (present on admission) -Rate controlled with atenolol -Xarelto resumed after surgery     Subjective: Pt sleeping soundly today, daughter at bedside.  Daughter reports pt was up all night, very restless.  Has been asleep all morning, likely from pain medicine and no sleep overnight.  No acute events reported.  Pt does wake to voice briefly and denies pain.  Objective Vitals notable for BP's labile 127/60 >> 188/79, on room air, no fevers.   General exam: sleeping, wakes to voice, no acute distress HEENT: moist mucus membranes, hearing grossly normal  Respiratory system: CTAB, no wheezes, rales or rhonchi, normal respiratory effort. Cardiovascular system: normal S1/S2, RRR, no pedal edema.   Central nervous system: limited exam w/ pt sleeping, no gross focal deficits, normal speech Extremities: no edema, normal tone Skin: dry, intact, normal temperature   Data Reviewed: Labs notable for glucose 163, Cr 10.7, Hbg 11.0  Family Communication: daughter at bedside on rounds  Disposition: Status is: Inpatient  Remains inpatient appropriate because: post op hip hemiarthroplasty for fracture, pain not controlled, SNF placement anticipated pending PT/OT evaluations         Time spent: 35 minutes  Author: Ezekiel Slocumb 04/25/2021 4:59 PM  For on call review [email or web address].

## 2021-04-25 NOTE — Discharge Instructions (Addendum)
Information on my medicine - XARELTO (Rivaroxaban)  This medication education was reviewed with me or my healthcare representative as part of my discharge preparation.  The pharmacist that spoke with me during my hospital stay was:    Why was Xarelto prescribed for you? Xarelto was prescribed for you to reduce the risk of a blood clot forming that can cause a stroke if you have a medical condition called atrial fibrillation (a type of irregular heartbeat).  What do you need to know about xarelto ? Take your Xarelto ONCE DAILY at the same time every day with your evening meal. If you have difficulty swallowing the tablet whole, you may crush it and mix in applesauce just prior to taking your dose.  Take Xarelto exactly as prescribed by your doctor and DO NOT stop taking Xarelto without talking to the doctor who prescribed the medication.  Stopping without other stroke prevention medication to take the place of Xarelto may increase your risk of developing a clot that causes a stroke.  Refill your prescription before you run out.  After discharge, you should have regular check-up appointments with your healthcare provider that is prescribing your Xarelto.  In the future your dose may need to be changed if your kidney function or weight changes by a significant amount.  What do you do if you miss a dose? If you are taking Xarelto ONCE DAILY and you miss a dose, take it as soon as you remember on the same day then continue your regularly scheduled once daily regimen the next day. Do not take two doses of Xarelto at the same time or on the same day.   Important Safety Information A possible side effect of Xarelto is bleeding. You should call your healthcare provider right away if you experience any of the following: Bleeding from an injury or your nose that does not stop. Unusual colored urine (red or dark brown) or unusual colored stools (red or black). Unusual bruising for unknown  reasons. A serious fall or if you hit your head (even if there is no bleeding).  Some medicines may interact with Xarelto and might increase your risk of bleeding while on Xarelto. To help avoid this, consult your healthcare provider or pharmacist prior to using any new prescription or non-prescription medications, including herbals, vitamins, non-steroidal anti-inflammatory drugs (NSAIDs) and supplements.  This website has more information on Xarelto: [email or web address].[email or web address].[email or web address]. INSTRUCTIONS AFTER JOINT REPLACEMENT   Remove items at home which could result in a fall. This includes throw rugs or furniture in walking pathways ICE to the affected joint every three hours while awake for 30 minutes at a time, for at least the first 3-5 days, and then as needed for pain and swelling.  Continue to use ice for pain and swelling. You may notice swelling that will progress down to the foot and ankle.  This is normal after surgery.  Elevate your leg when you are not up walking on it.   Continue to use the breathing machine you got in the hospital (incentive spirometer) which will help keep your temperature down.  It is common for your temperature to cycle up and down following surgery, especially at night when you are not up moving around and exerting yourself.  The breathing machine keeps your lungs expanded and your temperature down.   DIET:  As you were doing prior to hospitalization, we recommend a well-balanced diet.  DRESSING / WOUND CARE / SHOWERING  Keep the surgical dressing until follow up.  The dressing is water proof, so you can shower without any extra covering.  IF THE DRESSING FALLS OFF or the wound gets wet inside, change the dressing with sterile gauze.  Please use good hand washing techniques before changing the dressing.  Do not use any lotions or creams on the incision until instructed by your surgeon.    ACTIVITY  Increase activity slowly as tolerated, but follow the weight bearing  instructions below.   No driving for 6 weeks or until further direction given by your physician.  You cannot drive while taking narcotics.  No lifting or carrying greater than 10 lbs. until further directed by your surgeon. Avoid periods of inactivity such as sitting longer than an hour when not asleep. This helps prevent blood clots.  You may return to work once you are authorized by your doctor.     WEIGHT BEARING   Weight bearing as tolerated with assist device (walker, cane, etc) as directed, use it as long as suggested by your surgeon or therapist, typically at least 4-6 weeks.   EXERCISES  Results after joint replacement surgery are often greatly improved when you follow the exercise, range of motion and muscle strengthening exercises prescribed by your doctor. Safety measures are also important to protect the joint from further injury. Any time any of these exercises cause you to have increased pain or swelling, decrease what you are doing until you are comfortable again and then slowly increase them. If you have problems or questions, call your caregiver or physical therapist for advice.   Rehabilitation is important following a joint replacement. After just a few days of immobilization, the muscles of the leg can become weakened and shrink (atrophy).  These exercises are designed to build up the tone and strength of the thigh and leg muscles and to improve motion. Often times heat used for twenty to thirty minutes before working out will loosen up your tissues and help with improving the range of motion but do not use heat for the first two weeks following surgery (sometimes heat can increase post-operative swelling).   These exercises can be done on a training (exercise) mat, on the floor, on a table or on a bed. Use whatever works the best and is most comfortable for you.    Use music or television while you are exercising so that the exercises are a pleasant break in your day. This  will make your life better with the exercises acting as a break in your routine that you can look forward to.   Perform all exercises about fifteen times, three times per day or as directed.  You should exercise both the operative leg and the other leg as well.  Exercises include:   Quad Sets - Tighten up the muscle on the front of the thigh (Quad) and hold for 5-10 seconds.   Straight Leg Raises - With your knee straight (if you were given a brace, keep it on), lift the leg to 60 degrees, hold for 3 seconds, and slowly lower the leg.  Perform this exercise against resistance later as your leg gets stronger.  Leg Slides: Lying on your back, slowly slide your foot toward your buttocks, bending your knee up off the floor (only go as far as is comfortable). Then slowly slide your foot back down until your leg is flat on the floor again.  Angel Wings: Lying on your back spread your legs to the side as far apart as you can without causing discomfort.  Hamstring Strength:  Lying on your back, push your heel against the floor with your leg straight by tightening up the muscles of your buttocks.  Repeat, but this time bend your knee to a comfortable angle, and push your heel against the floor.  You may put a pillow under the heel to make it more comfortable if necessary.   A rehabilitation program following joint replacement surgery can speed recovery and prevent re-injury in the future due to weakened muscles. Contact your doctor or a physical therapist for more information on knee rehabilitation.    CONSTIPATION  Constipation is defined medically as fewer than three stools per week and severe constipation as less than one stool per week.  Even if you have a regular bowel pattern at home, your normal regimen is likely to be disrupted due to multiple reasons following surgery.  Combination of anesthesia, postoperative narcotics, change in appetite and fluid intake all can affect your bowels.   YOU MUST use  at least one of the following options; they are listed in order of increasing strength to get the job done.  They are all available over the counter, and you may need to use some, POSSIBLY even all of these options:    Drink plenty of fluids (prune juice may be helpful) and high fiber foods Colace 100 mg by mouth twice a day  Senokot for constipation as directed and as needed Dulcolax (bisacodyl), take with full glass of water  Miralax (polyethylene glycol) once or twice a day as needed.  If you have tried all these things and are unable to have a bowel movement in the first 3-4 days after surgery call either your surgeon or your primary doctor.    If you experience loose stools or diarrhea, hold the medications until you stool forms back up.  If your symptoms do not get better within 1 week or if they get worse, check with your doctor.  If you experience "the worst abdominal pain ever" or develop nausea or vomiting, please contact the office immediately for further recommendations for treatment.   ITCHING:  If you experience itching with your medications, try taking only a single pain pill, or even half a pain pill at a time.  You can also use Benadryl over the counter for itching or also to help with sleep.   TED HOSE STOCKINGS:  Use stockings on both legs until for at least 2 weeks or as directed by physician office. They may be removed at night for sleeping.  MEDICATIONS:  See your medication summary on the After Visit Summary that nursing will review with you.  You may have some home medications which will be placed on hold until you complete the course of blood thinner medication.  It is important for you to complete the blood thinner medication as prescribed.  PRECAUTIONS:  If you experience chest pain or shortness of breath - call 911 immediately for transfer to the hospital emergency department.   If you develop a fever greater that 101 F, purulent drainage from wound, increased  redness or drainage from wound, foul odor from the wound/dressing, or calf pain - CONTACT YOUR SURGEON.                                                   FOLLOW-UP APPOINTMENTS:  If you do not already  have a post-op appointment, please call the office for an appointment to be seen by your surgeon.  Guidelines for how soon to be seen are listed in your After Visit Summary, but are typically between 1-4 weeks after surgery.  OTHER INSTRUCTIONS:   Knee Replacement:  Do not place pillow under knee, focus on keeping the knee straight while resting. CPM instructions: 0-90 degrees, 2 hours in the morning, 2 hours in the afternoon, and 2 hours in the evening. Place foam block, curve side up under heel at all times except when in CPM or when walking.  DO NOT modify, tear, cut, or change the foam block in any way.  POST-OPERATIVE OPIOID TAPER INSTRUCTIONS: It is important to wean off of your opioid medication as soon as possible. If you do not need pain medication after your surgery it is ok to stop day one. Opioids include: Codeine, Hydrocodone(Norco, Vicodin), Oxycodone(Percocet, oxycontin) and hydromorphone amongst others.  Long term and even short term use of opiods can cause: Increased pain response Dependence Constipation Depression Respiratory depression And more.  Withdrawal symptoms can include Flu like symptoms Nausea, vomiting And more Techniques to manage these symptoms Hydrate well Eat regular healthy meals Stay active Use relaxation techniques(deep breathing, meditating, yoga) Do Not substitute Alcohol to help with tapering If you have been on opioids for less than two weeks and do not have pain than it is ok to stop all together.  Plan to wean off of opioids This plan should start within one week post op of your joint replacement. Maintain the same interval or time between taking each dose and first decrease the dose.  Cut the total daily intake of opioids by one tablet each  day Next start to increase the time between doses. The last dose that should be eliminated is the evening dose.   MAKE SURE YOU:  Understand these instructions.  Get help right away if you are not doing well or get worse.    Thank you for letting us be a part of your medical care team.  It is a privilege we respect greatly.  We hope these instructions will help you stay on track for a fast and full recovery!

## 2021-04-25 NOTE — NC FL2 (Signed)
[place] MEDICAID FL2 LEVEL OF CARE SCREENING TOOL     IDENTIFICATION  Patient Name: Kerry Carlson Birthdate: 1925/06/08 Sex: female Admission Date (Current Location): 04/21/2021  [facility] and [place] Number:  Producer, television/film/video and Address:  [facility], [address] [place], [place] [address]      Provider Number: [id number]  Attending Physician Name and Address:  Pennie Banter, DO  Relative Name and Phone Number:  Graylon Gunning [phone]    Current Level of Care: Hospital Recommended Level of Care: Skilled Nursing Facility Prior Approval Number:    Date Approved/Denied:   PASRR Number: [id number]  Discharge Plan: SNF    Current Diagnoses: Patient Active Problem List   Diagnosis Date Noted   AKI (acute kidney injury) (HCC) 04/22/2021   Closed right hip fracture, initial encounter (HCC) 04/21/2021   Diabetes mellitus without complication (HCC) 04/21/2021   Hyperlipidemia 04/21/2021   Hypertension 04/21/2021   Thyroid disease 04/21/2021   DNR (do not resuscitate) 04/21/2021   Acute lower UTI 04/21/2021   Encounter for care of pacemaker 05/23/2020   Pacemaker - Medtronic Azure single chamber pacemaker 07/12/2016. 05/23/2020   Second degree Mobitz II AV block 05/23/2020   Permanent atrial fibrillation (HCC) 05/23/2020    Orientation RESPIRATION BLADDER Height & Weight     Self  Normal Incontinent Weight: 158 lb (71.7 kg) Height:  5\' 6"  (167.6 cm)  BEHAVIORAL SYMPTOMS/MOOD NEUROLOGICAL BOWEL NUTRITION STATUS      Continent Diet (see discharge summary)  AMBULATORY STATUS COMMUNICATION OF NEEDS Skin   Total Care Verbally Surgical wounds                       Personal Care Assistance Level of Assistance  Bathing, Feeding, Dressing Bathing Assistance: Maximum assistance Feeding assistance: Limited assistance Dressing Assistance: Maximum assistance     Functional Limitations Info  Sight, Hearing, Speech Sight  Info: Adequate Hearing Info: Adequate Speech Info: Adequate    SPECIAL CARE FACTORS FREQUENCY  PT (By licensed PT), OT (By licensed OT)     PT Frequency: 5x week OT Frequency: 5x week            Contractures Contractures Info: Not present    Additional Factors Info  Code Status, Allergies, Insulin Sliding Scale Code Status Info: DNR Allergies Info: Aleve (Naproxen), Aspirin, Penicillins   Insulin Sliding Scale Info: Novolog, 0-9 units 3x day with meals.  See discharge summary.       Current Medications (04/25/2021):  This is the current hospital active medication list Current Facility-Administered Medications  Medication Dose Route Frequency Provider Last Rate Last Admin   0.9 %  sodium chloride infusion   Intravenous Continuous 06/23/2021, MD   Stopped at 04/24/21 [phone]   acetaminophen (TYLENOL) tablet 1,000 mg  1,000 mg Oral Q8H Griffith, Kelly A, DO   1,000 mg at 04/25/21 1222   atenolol (TENORMIN) tablet 100 mg  100 mg Oral BID 06/23/21, MD   100 mg at 04/25/21 06/23/21   bisacodyl (DULCOLAX) EC tablet 5 mg  5 mg Oral Daily PRN 0737, MD       Chlorhexidine Gluconate Cloth 2 % PADS 6 each  6 each Topical Daily Kathryne Hitch A, DO   6 each at 04/25/21 06/23/21   diphenhydrAMINE (BENADRYL) 12.5 MG/5ML elixir 12.5-25 mg  12.5-25 mg Oral Q4H PRN 05-23-1984, MD       docusate sodium (COLACE)  capsule 100 mg  100 mg Oral BID Kathryne Hitch, MD   100 mg at 04/24/21 2016   famotidine (PEPCID) tablet 20 mg  20 mg Oral Daily Kathryne Hitch, MD   20 mg at 04/25/21 8127   feeding supplement (ENSURE ENLIVE / ENSURE PLUS) liquid 237 mL  237 mL Oral BID BM Kathryne Hitch, MD   237 mL at 04/25/21 0938   insulin aspart (novoLOG) injection 0-9 Units  0-9 Units Subcutaneous TID WC Kathryne Hitch, MD   1 Units at 04/25/21 1222   levothyroxine (SYNTHROID) tablet 100 mcg  100 mcg Oral QAC breakfast Kathryne Hitch, MD   100 mcg at 04/24/21 0532   menthol-cetylpyridinium (CEPACOL) lozenge 3 mg  1 lozenge Oral PRN Kathryne Hitch, MD       Or   phenol (CHLORASEPTIC) mouth spray 1 spray  1 spray Mouth/Throat PRN Kathryne Hitch, MD       methocarbamol (ROBAXIN) tablet 500 mg  500 mg Oral Q6H PRN Kathryne Hitch, MD       Or   methocarbamol (ROBAXIN) 500 mg in dextrose 5 % 50 mL IVPB  500 mg Intravenous Q6H PRN Kathryne Hitch, MD       metoCLOPramide (REGLAN) tablet 5-10 mg  5-10 mg Oral Q8H PRN Kathryne Hitch, MD       Or   metoCLOPramide (REGLAN) injection 5-10 mg  5-10 mg Intravenous Q8H PRN Kathryne Hitch, MD       morphine 2 MG/ML injection 0.5-1 mg  0.5-1 mg Intravenous Q2H PRN Kathryne Hitch, MD   1 mg at 04/25/21 0439   multivitamin with minerals tablet 1 tablet  1 tablet Oral Daily Kathryne Hitch, MD   1 tablet at 04/25/21 0937   ondansetron (ZOFRAN) injection 4 mg  4 mg Intravenous Q6H PRN Kathryne Hitch, MD       ondansetron [facility]) tablet 4 mg  4 mg Oral Q6H PRN Kathryne Hitch, MD       Or   ondansetron [facility]) injection 4 mg  4 mg Intravenous Q6H PRN Kathryne Hitch, MD       oxyCODONE (Oxy IR/ROXICODONE) immediate release tablet 5 mg  5 mg Oral Q4H PRN Esaw Grandchild A, DO       polyethylene glycol (MIRALAX / GLYCOLAX) packet 17 g  17 g Oral Daily PRN Kathryne Hitch, MD       pravastatin (PRAVACHOL) tablet 10 mg  10 mg Oral Daily Kathryne Hitch, MD   10 mg at 04/25/21 5170   Rivaroxaban (XARELTO) tablet 15 mg  15 mg Oral Daily Kathryne Hitch, MD   15 mg at 04/25/21 0174   traMADol (ULTRAM) tablet 50-100 mg  50-100 mg Oral Q6H PRN Esaw Grandchild A, DO   50 mg at 04/24/21 2009     Discharge Medications: Please see discharge summary for a list of discharge medications.  Relevant Imaging Results:  Relevant Lab Results:   Additional Information SSN:  [id number].  Lorri Frederick, LCSW

## 2021-04-25 NOTE — Care Management Important Message (Signed)
Important Message  Patient Details  Name: Kerry Carlson MRN: [id number] Date of Birth: 07-Aug-1925   Medicare Important Message Given:  Yes     Hannah Beat 04/25/2021, 1:17 PM

## 2021-04-25 NOTE — Plan of Care (Signed)
  Problem: Education: Goal: Knowledge of General Education information will improve Description: Including pain rating scale, medication(s)/side effects and non-pharmacologic comfort measures Outcome: Progressing   Problem: Health Behavior/Discharge Planning: Goal: Ability to manage health-related needs will improve Outcome: Progressing   Problem: Clinical Measurements: Goal: Ability to maintain clinical measurements within normal limits will improve Outcome: Progressing Goal: Diagnostic test results will improve Outcome: Progressing Goal: Respiratory complications will improve Outcome: Progressing   Problem: Activity: Goal: Risk for activity intolerance will decrease Outcome: Progressing   Problem: Pain Managment: Goal: General experience of comfort will improve Outcome: Progressing   

## 2021-04-25 NOTE — Evaluation (Signed)
Physical Therapy Evaluation Patient Details Name: RIGHTEOUS BOUCHIE MRN: [id number] DOB: 08/01/1925 Today's Date: 04/25/2021  History of Present Illness  Pt is a 88 y/o female s/p R THA and is WBAT to RLE. PMH includes mild dementia, diabetes, hypothyroidism, hypertension, and recent UTI.  Clinical Impression  Received pt semi-reclined in bed asleep with daughter present at bedside. Upon wakening, pt very lethargic and with difficulty sustaining arousal and keeping eyes open; therefore subjective history taken from daughter. Pt required total A of 1-2 for bed mobility and max A +2 to stand from elevated EOB with RW. Acute PT to cont to follow.      Recommendations for follow up therapy are one component of a multi-disciplinary discharge planning process, led by the attending physician.  Recommendations may be updated based on patient status, additional functional criteria and insurance authorization.  Follow Up Recommendations Skilled nursing-short term rehab (<3 hours/day)    Assistance Recommended at Discharge Frequent or constant Supervision/Assistance  Patient can return home with the following  Two people to help with walking and/or transfers    Equipment Recommendations Rolling walker (2 wheels)  Recommendations for Other Services       Functional Status Assessment Patient has had a recent decline in their functional status and demonstrates the ability to make significant improvements in function in a reasonable and predictable amount of time.     Precautions / Restrictions Precautions Precautions: Fall Restrictions Weight Bearing Restrictions: Yes RLE Weight Bearing: Weight bearing as tolerated      Mobility  Bed Mobility Overal bed mobility: Needs Assistance Bed Mobility: Rolling;Supine to Sit;Sit to Supine Rolling: Total assist   Supine to sit: Total assist Sit to supine: +2 for physical assistance   General bed mobility comments: HOB elevated. Pt unable to follow  cues to reach for bedrail. Required +2 assist from daughter when trainsitioning sit<>supine Patient Response: Flat affect  Transfers Overall transfer level: Needs assistance Equipment used: Rolling walker (2 wheels) Transfers: Sit to/from Stand Sit to Stand: +2 physical assistance           General transfer comment: stood from elevated EOB with max A +2 and cues for hand placement on RW and on bed    Ambulation/Gait                  Stairs            Wheelchair Mobility    Modified Rankin (Stroke Patients Only)       Balance Overall balance assessment: Needs assistance   Sitting balance-Leahy Scale: Poor Sitting balance - Comments: initally required mod A for static sitting balance fading to min guard Postural control: Posterior lean Standing balance support: Bilateral upper extremity supported (RW) Standing balance-Leahy Scale: Poor Standing balance comment: stood from elevated EOB for ~20 seconds but difficulty achieving full hip extension                             Pertinent Vitals/Pain Pain Assessment: Faces (pt denied any pain but facial expressions suggested otherwise) Faces Pain Scale: Hurts little more Facial Expression: facial grimacing Pain Location: R hip Pain Descriptors / Indicators: Aching;Discomfort;Grimacing;Guarding;Moaning;Operative site guarding Pain Intervention(s): Limited activity within patient's tolerance;Monitored during session;Repositioned    Home Living Family/patient expects to be discharged to:: Private residence Living Arrangements: Children Available Help at Discharge: Family;Available PRN/intermittently Type of Home: House Home Access: Stairs to enter Entrance Stairs-Rails: None Entrance Stairs-Number of Steps: 1 Alternate  Level Stairs-Number of Steps: flight Home Layout: Two level;Able to live on main level with bedroom/bathroom Home Equipment: Standard Walker;Shower seat Additional Comments: history  taken from daughter as pt extremly lethargic    Prior Function Prior Level of Function : Independent/Modified Independent                     Hand Dominance        Extremity/Trunk Assessment        Lower Extremity Assessment Lower Extremity Assessment: RLE deficits/detail RLE Deficits / Details: unable to actively lift RLE RLE: Unable to fully assess due to pain       Communication   Communication: HOH  Cognition Arousal/Alertness: Lethargic;Suspect due to medications   Overall Cognitive Status: History of cognitive impairments - at baseline                                 General Comments: difficulty sustaining arousal and keeping eyes open        General Comments      Exercises     Assessment/Plan    PT Assessment Patient needs continued PT services  PT Problem List Decreased strength;Decreased range of motion;Decreased activity tolerance;Decreased balance;Decreased mobility;Decreased coordination;Decreased cognition;Decreased knowledge of use of DME;Decreased safety awareness;Pain       PT Treatment Interventions DME instruction;Gait training;Stair training;Functional mobility training;Therapeutic activities;Therapeutic exercise;Balance training;Neuromuscular re-education;Cognitive remediation;Patient/family education;Wheelchair mobility training;Modalities    PT Goals (Current goals can be found in the Care Plan section)  Acute Rehab PT Goals Patient Stated Goal: did not state PT Goal Formulation: With patient/family Time For Goal Achievement: 04/30/21 Potential to Achieve Goals: Fair    Frequency 7X/week (QD)     Co-evaluation               AM-PAC PT "6 Clicks" Mobility  Outcome Measure Help needed turning from your back to your side while in a flat bed without using bedrails?: Total Help needed moving from lying on your back to sitting on the side of a flat bed without using bedrails?: Total Help needed moving to and  from a bed to a chair (including a wheelchair)?: Total Help needed standing up from a chair using your arms (e.g., wheelchair or bedside chair)?: Total Help needed to walk in hospital room?: Total Help needed climbing 3-5 steps with a railing? : Total 6 Click Score: 6    End of Session Equipment Utilized During Treatment: Gait belt Activity Tolerance: Patient limited by fatigue;Patient limited by lethargy;Patient limited by pain Patient left: in bed;with call bell/phone within reach;with family/visitor present Nurse Communication: Mobility status PT Visit Diagnosis: Unsteadiness on feet (R26.81);Other abnormalities of gait and mobility (R26.89);Repeated falls (R29.6);Muscle weakness (generalized) (M62.81);History of falling (Z91.81);Pain Pain - Right/Left: Right Pain - part of body: Hip    Time: [id number] PT Time Calculation (min) (ACUTE ONLY): 28 min   Charges:   PT Evaluation $PT Eval Moderate Complexity: 1 Mod PT Treatments $Therapeutic Activity: 8-22 mins        [name], [name]  [name] 04/25/2021, 10:46 AM

## 2021-04-25 NOTE — Progress Notes (Signed)
At 1930 during first rounding pt was able to state she was in pain, but still lethargic and unable to keep her eyes open. On assessment realized pt had no IV's in. During insertion attempts pt kept jerking her arm around.  IV team consult put in. Around 2030 When IV nurse and RN went into room pt had her eyes completely open and responded appropriately to entry. During the insertion pt appeared to become completely oriented. She was able to have complete conversations, talking about her past family and her present family. Able to discuss what happened and why she was here. After insertion pt was given pain pills and she was able to swallow with no issues, then continued to drink an entire cup of water. Now pt is sleeping and appears to be comfortable.

## 2021-04-25 NOTE — Progress Notes (Signed)
Subjective: 1 Day Post-Op Procedure(s) (LRB): PARTIAL vs. TOTAL HIP ARTHROPLASTY ANTERIOR APPROACH (Right) Patient reports pain as moderate.  Family is at the bedside.  She was quite confused yesterday as well as last evening.  She does have some mild baseline dementia but this was certainly a little more.  Nursing said they are working on getting her better oriented.  She is back on Xarelto that she was on prior to surgery.  She has minimal acute blood loss anemia from surgery and her vitals are stable.  Objective: Vital signs in last 24 hours: Temp:  [97.5 F (36.4 C)-98.8 F (37.1 C)] 97.5 F (36.4 C) (01/03 0729) Pulse Rate:  [25-107] 107 (01/03 0729) Resp:  [13-20] 18 (01/03 0729) BP: (116-188)/(60-92) 173/81 (01/03 0729) SpO2:  [97 %-100 %] 97 % (01/03 0729)  Intake/Output from previous day: 01/02 0701 - 01/03 0700 In: 1863.4 [P.O.:120; I.V.:1359.6; IV Piggyback:383.7] Out: 1150 [Urine:950; Blood:200] Intake/Output this shift: No intake/output data recorded.  Recent Labs    04/23/21 0211 04/24/21 0341 04/25/21 0457  HGB 12.2 11.6* 11.0*   Recent Labs    04/24/21 0341 04/25/21 0457  WBC 9.1 9.5  RBC 3.83* 3.50*  HCT 36.4 33.4*  PLT 218 199   Recent Labs    04/24/21 0341 04/25/21 0457  NA 138 136  K 4.2 4.4  CL 104 104  CO2 25 25  BUN 19 13  CREATININE 1.18* 1.07*  GLUCOSE 149* 163*  CALCIUM 8.6* 8.4*   No results for input(s): LABPT, INR in the last 72 hours.  Intact pulses distally Dorsiflexion/Plantar flexion intact Incision: scant drainage   Assessment/Plan: 1 Day Post-Op Procedure(s) (LRB): PARTIAL vs. TOTAL HIP ARTHROPLASTY ANTERIOR APPROACH (Right) I did speak to her daughter at the bedside.  She will likely need short-term skilled nursing placement given the assistance that she will need with the hope of her getting eventually back to her baseline where she lives at home with family.  She will continue her Xarelto per what she was on prior to  surgery.       Kathryne Hitch 04/25/2021, 9:47 AM

## 2021-04-26 ENCOUNTER — Encounter (HOSPITAL_COMMUNITY): Payer: Self-pay | Admitting: Orthopaedic Surgery

## 2021-04-26 DIAGNOSIS — Z66 Do not resuscitate: Secondary | ICD-10-CM

## 2021-04-26 DIAGNOSIS — I10 Essential (primary) hypertension: Secondary | ICD-10-CM

## 2021-04-26 DIAGNOSIS — E079 Disorder of thyroid, unspecified: Secondary | ICD-10-CM

## 2021-04-26 LAB — BASIC METABOLIC PANEL
Anion gap: 7 (ref 5–15)
BUN: 14 mg/dL (ref 8–23)
CO2: 23 mmol/L (ref 22–32)
Calcium: 8.5 mg/dL — ABNORMAL LOW (ref 8.9–10.3)
Chloride: 105 mmol/L (ref 98–111)
Creatinine, Ser: 0.99 mg/dL (ref 0.44–1.00)
GFR, Estimated: 53 mL/min — ABNORMAL LOW (ref >60)
Glucose, Bld: 131 mg/dL — ABNORMAL HIGH (ref 70–99)
Potassium: 4.3 mmol/L (ref 3.5–5.1)
Sodium: 135 mmol/L (ref 135–145)

## 2021-04-26 LAB — CBC
HCT: 33.8 % — ABNORMAL LOW (ref 36.0–46.0)
Hemoglobin: 10.8 g/dL — ABNORMAL LOW (ref 12.0–15.0)
MCH: 30.7 pg (ref 26.0–34.0)
MCHC: 32 g/dL (ref 30.0–36.0)
MCV: 96 fL (ref 80.0–100.0)
Platelets: 199 10*3/uL (ref 150–400)
RBC: 3.52 MIL/uL — ABNORMAL LOW (ref 3.87–5.11)
RDW: 13.9 % (ref 11.5–15.5)
WBC: 10 10*3/uL (ref 4.0–10.5)
nRBC: 0 % (ref 0.0–0.2)

## 2021-04-26 LAB — RESP PANEL BY RT-PCR (FLU A&B, COVID) ARPGX2
Influenza A by PCR: NEGATIVE
Influenza B by PCR: NEGATIVE
SARS Coronavirus 2 by RT PCR: NEGATIVE

## 2021-04-26 LAB — GLUCOSE, CAPILLARY
Glucose-Capillary: 101 mg/dL — ABNORMAL HIGH (ref 70–99)
Glucose-Capillary: 139 mg/dL — ABNORMAL HIGH (ref 70–99)

## 2021-04-26 MED ORDER — ADULT MULTIVITAMIN W/MINERALS CH: 1.0000 | ORAL_TABLET | Freq: Every day | ORAL | Status: AC

## 2021-04-26 MED ORDER — ENSURE ENLIVE PO LIQD: 237.0000 mL | Freq: Two times a day (BID) | ORAL | 12 refills | Status: DC

## 2021-04-26 MED ORDER — TRAMADOL HCL 50 MG PO TABS: 50.0000 mg | ORAL_TABLET | Freq: Four times a day (QID) | ORAL | 0 refills | Status: DC | PRN

## 2021-04-26 MED ORDER — ACETAMINOPHEN 500 MG PO TABS: ORAL_TABLET | ORAL | 0 refills | Status: AC

## 2021-04-26 MED ORDER — ATENOLOL 100 MG PO TABS: 100.0000 mg | ORAL_TABLET | Freq: Two times a day (BID) | ORAL | Status: DC

## 2021-04-26 NOTE — Progress Notes (Signed)
Mobility Specialist Progress Note   04/26/21 1230  Mobility  Activity Transferred:  Bed to chair  Level of Assistance Maximum assist, patient does 25-49%  Assistive Device Front wheel walker  RLE Weight Bearing WBAT  Distance Ambulated (ft) 2 ft  Mobility Sit up in bed/chair position for meals;Out of bed to chair with meals  Mobility Response Tolerated well  Mobility performed by Mobility specialist  $Mobility charge 1 Mobility   Encouraged pt to sit up in chair for lunch and they agreed. Mod A to EOB for trunk instability and RLE physical assistance. Max A STS from elevated surface d/t R hip pain but able to support self on RW once fully standing. SPT to chair w/ mod cues on foot placement, direction and hand placement for proper transfer. Left in chair w/ family in room, lunch tray in front and call bell in reach.  Frederico Hamman Mobility Specialist Phone Number [phone]

## 2021-04-26 NOTE — Progress Notes (Signed)
Physical Therapy Treatment Patient Details Name: Kerry Carlson MRN: [id number] DOB: 11-27-1925 Today's Date: 04/26/2021   History of Present Illness Pt is a 88 y/o female s/p R THA and is WBAT to RLE. PMH includes mild dementia, diabetes, hypothyroidism, hypertension, and recent UTI.    PT Comments    Received pt sleeping in recliner with family present at bedside. Upon wakening, pt requested to return to bed. Sit<>stand with RW and max A from recliner. Pt unable to fully extend hips or step LLE to pivot to bed, therefore returned to sitting and performed stand/squat<>pivot recliner<>bed with max A and returned to supine with +2 assist. Plan to D/C to SNF later today. Acute PT to cont to follow.     Recommendations for follow up therapy are one component of a multi-disciplinary discharge planning process, led by the attending physician.  Recommendations may be updated based on patient status, additional functional criteria and insurance authorization.  Follow Up Recommendations  Skilled nursing-short term rehab (<3 hours/day)     Assistance Recommended at Discharge Frequent or constant Supervision/Assistance  Patient can return home with the following     Equipment Recommendations  Rolling walker (2 wheels)    Recommendations for Other Services       Precautions / Restrictions Precautions Precautions: Fall Restrictions Weight Bearing Restrictions: Yes RLE Weight Bearing: Weight bearing as tolerated     Mobility  Bed Mobility Overal bed mobility: Needs Assistance Bed Mobility: Sit to Supine       Sit to supine: +2 for physical assistance   General bed mobility comments: Required assist for BLE management and 2nd person for trunk control    Transfers Overall transfer level: Needs assistance Equipment used: Rolling walker (2 wheels) Transfers: Sit to/from Stand;Bed to chair/wheelchair/BSC Sit to Stand: Max assist Stand pivot transfers: Max assist         General  transfer comment: stood from recliner with max A, but unable to step LLE to transfer to bed. Returned to sitting then performed stand/squat<>pivot from recliner<>bed    Ambulation/Gait                   Stairs             Wheelchair Mobility    Modified Rankin (Stroke Patients Only)       Balance Overall balance assessment: Needs assistance         Standing balance support: Bilateral upper extremity supported;During functional activity (RW) Standing balance-Leahy Scale: Poor Standing balance comment: pt with difficulty achieving full hip extension                            Cognition Arousal/Alertness: Lethargic Behavior During Therapy: Flat affect Overall Cognitive Status: History of cognitive impairments - at baseline                                 General Comments: kept eyes closes during majority of session        Exercises      General Comments        Pertinent Vitals/Pain Pain Assessment: 0-10 Pain Score: 5  Pain Location: back Pain Descriptors / Indicators: Aching;Discomfort;Grimacing;Moaning Pain Intervention(s): Limited activity within patient's tolerance;Monitored during session;Repositioned;Patient requesting pain meds-RN notified    Home Living  Prior Function            PT Goals (current goals can now be found in the care plan section) Acute Rehab PT Goals Patient Stated Goal: did not state PT Goal Formulation: With patient/family Time For Goal Achievement: 04/30/21 Potential to Achieve Goals: Fair Progress towards PT goals: Progressing toward goals    Frequency    7X/week      PT Plan Current plan remains appropriate    Co-evaluation              AM-PAC PT "6 Clicks" Mobility   Outcome Measure  Help needed turning from your back to your side while in a flat bed without using bedrails?: Total Help needed moving from lying on your back to sitting  on the side of a flat bed without using bedrails?: Total Help needed moving to and from a bed to a chair (including a wheelchair)?: A Lot Help needed standing up from a chair using your arms (e.g., wheelchair or bedside chair)?: A Lot     6 Click Score: 6    End of Session Equipment Utilized During Treatment: Gait belt Activity Tolerance: Patient limited by fatigue;Patient limited by pain Patient left: in bed;with call bell/phone within reach;with family/visitor present;with bed alarm set Nurse Communication: Mobility status PT Visit Diagnosis: Unsteadiness on feet (R26.81);Other abnormalities of gait and mobility (R26.89);Repeated falls (R29.6);Muscle weakness (generalized) (M62.81);History of falling (Z91.81);Pain Pain - Right/Left:  (back)     Time: [id number] PT Time Calculation (min) (ACUTE ONLY): 14 min  Charges:  $Therapeutic Activity: 8-22 mins                     [name], [name]  [name] 04/26/2021, 2:46 PM

## 2021-04-26 NOTE — TOC Transition Note (Signed)
Transition of Care [facility]) - CM/SW Discharge Note   Patient Details  Name: ZAYRA DEVITO MRN: [id number] Date of Birth: 11/03/1925  Transition of Care [facility]) CM/SW Contact:  Lorri Frederick, LCSW Phone Number: 04/26/2021, 1:48 PM   Clinical Narrative:   Pt discharging to [facility], room 103P.  RN call report to [phone].    Final next level of care: Skilled Nursing Facility Barriers to Discharge: Barriers Resolved   Patient Goals and CMS Choice Patient states their goals for this hospitalization and ongoing recovery are:: Patient unable to verbalize her goal. Daughter in law at bedside states that all the fa,ily just wants her to get stronger to come back home.   Choice offered to / list presented to : NA  Discharge Placement              Patient chooses bed at:  [facility]) Patient to be transferred to facility by: [facility] Name of family member notified: daughter [name] in room Patient and family notified of of transfer: 04/26/21  Discharge Plan and Services   Discharge Planning Services: CM Consult            DME Arranged: N/A DME Agency: NA       HH Arranged: NA          Social Determinants of Health (SDOH) Interventions     Readmission Risk Interventions No flowsheet data found.

## 2021-04-26 NOTE — Plan of Care (Signed)

## 2021-04-26 NOTE — Progress Notes (Signed)
Patient ID: Kerry Carlson, female   DOB: [birth year], 88 y.o.   MRN: [id number] No acute changes.  Vitals stable and labs stable.  Right hip dressing changed.  Incision looks good.  Needs short-term skilled nursing placement.

## 2021-04-26 NOTE — TOC Progression Note (Addendum)
Transition of Care [facility]) - Progression Note    Patient Details  Name: Kerry Carlson MRN: [id number] Date of Birth: May 02, 1925  [facility] CM/SW Contact  Lorri Frederick, LCSW Phone Number: 04/26/2021, 11:27 AM  Clinical Narrative:   CSW spoke with pt, daughter [name], and son [name] in room to present bed offers.  They would like to do research before making decision.  1120:  CSW spoke with daughter and son, they would like to chose [facility].  CSW spoke with [facility] [place].  She does not have a bed today, should have one tomorrow.  If any of today's admissions cancel, she will call back and can take this pt.  .  MD informed.    1200: [facility].  They do have a bed available and can take pt today.  MD informed.  Covid pending as well.      Expected Discharge Plan: Skilled Nursing Facility Barriers to Discharge: Continued Medical Work up  Expected Discharge Plan and Services Expected Discharge Plan: Skilled Nursing Facility   Discharge Planning Services: CM Consult   Living arrangements for the past 2 months: Single Family Home Expected Discharge Date: 04/26/21               DME Arranged: N/A DME Agency: NA       HH Arranged: NA           Social Determinants of Health (SDOH) Interventions    Readmission Risk Interventions No flowsheet data found.

## 2021-04-26 NOTE — Discharge Summary (Signed)
Physician Discharge Summary  Kerry Carlson WUJ:[id number] DOB: 10/22/1925 DOA: 04/21/2021  PCP: Gaspar Garbeisovec, Richard W, MD  Admit date: 04/21/2021 Discharge date: 04/26/2021 Admitted From: Home Disposition:   SNF Re SNFcommendations for Outpatient Follow-up:  Follow ups as below. Please obtain CBC/BMP/Mag at follow up Please follow up on the following pending results: None  Discharge Condition: Stable CODE STATUS: DNR/DNI  Follow-up Information     Kathryne HitchBlackman, Christopher Y, MD. Schedule an appointment as soon as possible for a visit in 2 week(s).   Specialty: Orthopedic Surgery Contact information: [address] [place] [address] [phone]                Hospital Course: 88 year old F with PMH of DM-2, hypothyroidism, hypertension and recent UTI presenting to ED on 04/21/2021 with right hip pain after accidental fall and unable to stand.  She was found to have closed right hip fracture for which he underwent right hip hemiarthroplasty on 04/24/2021.  She was started on IV ceftriaxone and completed course for UTI.    Hospital course complicated by brief encephalopathy that has resolved.  She was evaluated by therapy who recommended SNF  Patient needs outpatient follow-up with orthopedic surgery in 2 weeks as above.   See individual problem list below for more on hospital course.  Discharge Diagnoses:  Accidental fall at home Closed right hip fracture, initial encounter [facility](HCC)- (present on admission) -S/p right hip hemiarthroplasty on 04/24/2021 -Tylenol and Percocet for pain control per surgery -Already on Xarelto for A. fib which would provide VTE prophylaxis. -Continue therapy at rehab  Acute metabolic encephalopathy: Resolved.  Likely iatrogenic -Reorientation and delirium precautions.   AKI: POA.  Resolved. Recent Labs    04/21/21 0403 04/22/21 0151 04/23/21 0211 04/24/21 0341 04/25/21 0457 04/26/21 0243  BUN 13 15 22 19 13 14   CREATININE 1.45* 1.28*  1.37* 1.18* 1.07* 0.99  -Recheck renal function in 1 week  Acute lower UTI: POA -Completed IV Rocephin x 3 days.     Thyroid disease/hypothyroidism -Continue Synthroid   Essential hypertension -Continue Atenolol   DM-2 with hyperglycemia: CBG within acceptable range.  Barely required insulin Recent Labs  Lab 04/25/21 1125 04/25/21 1605 04/25/21 2001 04/26/21 0640 04/26/21 1203  GLUCAP 127* 111* 124* 101* 139*  -Check hemoglobin A1c outpatient  Hyperlipidemia: POA -Continue Pravastatin   Permanent atrial fibrillation: Rate controlled. -Continue Tylenol and Xarelto  Increased nutrient needs Body mass index is 25.5 kg/m. Nutrition Problem: Increased nutrient needs Etiology: hip fracture Signs/Symptoms: estimated needs Interventions: Ensure Enlive (each supplement provides 350kcal and 20 grams of protein), MVI     Discharge Exam: Vitals:   04/25/21 1446 04/25/21 2003 04/26/21 1204 04/26/21 1314  BP: (!) 148/109 (!) 157/89 128/65   Pulse: 91 80 67   Temp: 98.4 F (36.9 C) 98.3 F (36.8 C)    Resp: 17 18 17    Height:      Weight:      SpO2: 95% 98% (!) 78% 98%  TempSrc: Oral Oral    BMI (Calculated):         GENERAL: No apparent distress.  Nontoxic. HEENT: MMM.  Vision and hearing grossly intact.  NECK: Supple.  No apparent JVD.  RESP: 98% on RA.  No IWOB.  Fair aeration bilaterally. CVS: Irregular.  Normal rate.  Heart sounds normal.  ABD/GI/GU: Bowel sounds present. Soft. Non tender.  MSK/EXT:  Moves extremities. No apparent deformity. No edema.  SKIN: Dressing over right hip DCI. NEURO: Awake and alert.  Oriented appropriately.  No apparent focal neuro deficit. PSYCH: Calm. Normal affect.   Discharge Instructions  Discharge Instructions     Diet general   Complete by: As directed    Increase activity slowly   Complete by: As directed    No wound care   Complete by: As directed       Allergies as of 04/26/2021       Reactions   Aleve  [naproxen] Hives   Aspirin Nausea And Vomiting   Penicillins Hives   Had a really long time ago, caused hives Has patient had a PCN reaction causing immediate rash, facial/tongue/throat swelling, SOB or lightheadedness with hypotension: unknown Has patient had a PCN reaction causing severe rash involving mucus membranes or skin necrosis: unknown Has patient had a PCN reaction that required hospitalization unknown Has patient had a PCN reaction occurring within the last 10 years:no If all of the above answers are "NO", then may proceed with Cephalosporin use.        Medication List     STOP taking these medications    meclizine 25 MG tablet Commonly known as: ANTIVERT       TAKE these medications    acetaminophen 500 MG tablet Commonly known as: TYLENOL Take 2 tablets (1,000 mg total) by mouth every 8 (eight) hours for 5 days, THEN 2 tablets (1,000 mg total) every 8 (eight) hours as needed. Start taking on: April 26, 2021   atenolol 100 MG tablet Commonly known as: TENORMIN Take 1 tablet (100 mg total) by mouth 2 (two) times daily.   famotidine 20 MG tablet Commonly known as: PEPCID Take 20 mg by mouth daily.   feeding supplement Liqd Take 237 mLs by mouth 2 (two) times daily between meals.   levothyroxine 100 MCG tablet Commonly known as: SYNTHROID Take 100 mcg by mouth daily before breakfast.   multivitamin with minerals Tabs tablet Take 1 tablet by mouth daily.   pravastatin 10 MG tablet Commonly known as: PRAVACHOL Take 10 mg by mouth daily.   Rivaroxaban 15 MG Tabs tablet Commonly known as: XARELTO Take 15 mg by mouth daily with supper.   traMADol 50 MG tablet Commonly known as: ULTRAM Take 1-2 tablets (50-100 mg total) by mouth every 6 (six) hours as needed for moderate pain.               Durable Medical Equipment  (From admission, onward)           Start     Ordered   04/24/21 1117  DME 3 n 1  Once        04/24/21 1117   04/24/21  1117  DME Walker rolling  Once       Question Answer Comment  Walker: With 5 Inch Wheels   Patient needs a walker to treat with the following condition Status post total replacement of right hip      04/24/21 1117            Consultations: Orthopedic surgery  Procedures/Studies: Right hip fracture repair on 04/24/2021   DG Chest 1 View  Result Date: 04/21/2021 CLINICAL DATA:  Fall with right hip pain. EXAM: CHEST  1 VIEW COMPARISON:  None. FINDINGS: The heart is moderately enlarged. The central vasculature is normal caliber. There are heavy calcifications in the aortic arch. Left chest single lead cardiac assist device and ventricular wire insertion. The lungs are clear. No pleural effusion is seen. There is eventration of the medial right hemidiaphragm.  Osteopenia and mild thoracic degenerative features. No displaced rib fractures. IMPRESSION: Cardiomegaly with no acute chest findings. Electronically Signed   By: Almira Bar M.D.   On: 04/21/2021 04:46   DG Pelvis Portable  Result Date: 04/24/2021 CLINICAL DATA:  Status post right hip replacement. EXAM: PORTABLE PELVIS 1-2 VIEWS COMPARISON:  04/21/2021 FINDINGS: Single view of the pelvis demonstrates a right hip arthroplasty. Right hip appears located on this single view. The entire right femoral stem is visualized without a periprosthetic fracture. Visualized pelvic bony ring is intact. No acute abnormality to the left hip. Lucency in the soft tissues compatible with recent surgery. Surgical skin staples are present. IMPRESSION: Expected changes following right hip arthroplasty. No complicating features. Electronically Signed   By: Richarda Overlie M.D.   On: 04/24/2021 12:52   DG C-Arm 1-60 Min-No Report  Result Date: 04/24/2021 CLINICAL DATA:  Right hip arthroplasty. EXAM: DG HIP (WITH OR WITHOUT PELVIS) 1V RIGHT; DG C-ARM 1-60 MIN-NO REPORT Radiation exposure index: 1.1542 mGy. COMPARISON:  April 21, 2021. FINDINGS: Three  intraoperative fluoroscopic images were obtained of the right hip. These demonstrate right hip prosthesis to be well situated. IMPRESSION: Fluoroscopic guidance provided during right hip arthroplasty. Electronically Signed   By: Lupita Raider M.D.   On: 04/24/2021 09:41   DG HIP UNILAT WITH PELVIS 1V RIGHT  Result Date: 04/24/2021 CLINICAL DATA:  Right hip arthroplasty. EXAM: DG HIP (WITH OR WITHOUT PELVIS) 1V RIGHT; DG C-ARM 1-60 MIN-NO REPORT Radiation exposure index: 1.1542 mGy. COMPARISON:  April 21, 2021. FINDINGS: Three intraoperative fluoroscopic images were obtained of the right hip. These demonstrate right hip prosthesis to be well situated. IMPRESSION: Fluoroscopic guidance provided during right hip arthroplasty. Electronically Signed   By: Lupita Raider M.D.   On: 04/24/2021 09:41   DG Hip Unilat With Pelvis 2-3 Views Left  Addendum Date: 04/21/2021   ADDENDUM REPORT: 04/21/2021 05:44 ADDENDUM: Additional images were subsequently performed on the left hip and demonstrate osteopenia without evidence of fractures and mild features of nonerosive degenerative arthrosis. Electronically Signed   By: Almira Bar M.D.   On: 04/21/2021 05:44   Result Date: 04/21/2021 CLINICAL DATA:  Larey Seat with right hip pain. EXAM: DG HIP (WITH OR WITHOUT PELVIS) 2-3V LEFT COMPARISON:  None. FINDINGS: There is osteopenia with acute transverse subcapital proximal right femoral fracture, with the distal fragment cephalad displaced up to 2 cm and with mild impaction and internal rotation of the distal fragment. Both pelvic rings are intact. The visualized proximal left femur is intact. No further evidence of fractures. No widening of the SI joints and pubic symphysis. There are mild degenerative features at the SI joints and hips. IMPRESSION: Osteopenia with acute subcapital transverse proximal right femoral fracture with impaction, cephalad displacement and internal rotation of the distal fragment. Electronically  Signed: By: Almira Bar M.D. On: 04/21/2021 04:44   DG HIP UNILAT WITH PELVIS 2-3 VIEWS RIGHT  Result Date: 04/21/2021 CLINICAL DATA:  88 year old female with history of fall complaining of right-sided hip pain. EXAM: DG HIP (WITH OR WITHOUT PELVIS) 2-3V RIGHT COMPARISON:  No priors. FINDINGS: AP view of the bony pelvis and AP and lateral view of the right hip demonstrate a mildly displaced fracture of the right femoral neck which appears predominantly transcervical. There is approximately 1.4 cm of cephalad migration of the distal femur relative to the femoral head, likely with some mild impaction. Femoral head remains located in the right acetabulum. Bony pelvis itself appears intact, as  do the visualized portions of the left proximal femur. There is joint space narrowing, subchondral sclerosis, subchondral cyst formation and osteophyte formation in both hip joints, compatible with moderate osteoarthritis. IMPRESSION: 1. Mildly displaced transcervical right femoral neck fracture, as above. 2. Moderate bilateral hip joint osteoarthritis. Electronically Signed   By: Trudie Reed M.D.   On: 04/21/2021 05:45       The results of significant diagnostics from this hospitalization (including imaging, microbiology, ancillary and laboratory) are listed below for reference.     Microbiology: Recent Results (from the past 240 hour(s))  Resp Panel by RT-PCR (Flu A&B, Covid) Nasopharyngeal Swab     Status: None   Collection Time: 04/21/21 10:10 AM   Specimen: Nasopharyngeal Swab; Nasopharyngeal(NP) swabs in vial transport medium  Result Value Ref Range Status   SARS Coronavirus 2 by RT PCR NEGATIVE NEGATIVE Final    Comment: (NOTE) SARS-CoV-2 target nucleic acids are NOT DETECTED.  The SARS-CoV-2 RNA is generally detectable in upper respiratory specimens during the acute phase of infection. The lowest concentration of SARS-CoV-2 viral copies this assay can detect is 138 copies/mL. A negative  result does not preclude SARS-Cov-2 infection and should not be used as the sole basis for treatment or other patient management decisions. A negative result may occur with  improper specimen collection/handling, submission of specimen other than nasopharyngeal swab, presence of viral mutation(s) within the areas targeted by this assay, and inadequate number of viral copies(<138 copies/mL). A negative result must be combined with clinical observations, patient history, and epidemiological information. The expected result is Negative.  Fact Sheet for Patients:  BloggerCourse.com  Fact Sheet for Healthcare Providers:  SeriousBroker.it  This test is no t yet approved or cleared by the [place] FDA and  has been authorized for detection and/or diagnosis of SARS-CoV-2 by FDA under an Emergency Use Authorization (EUA). This EUA will remain  in effect (meaning this test can be used) for the duration of the COVID-19 declaration under Section 564(b)(1) of the Act, 21 U.S.C.section 360bbb-3(b)(1), unless the authorization is terminated  or revoked sooner.       Influenza A by PCR NEGATIVE NEGATIVE Final   Influenza B by PCR NEGATIVE NEGATIVE Final    Comment: (NOTE) The Xpert Xpress SARS-CoV-2/FLU/RSV plus assay is intended as an aid in the diagnosis of influenza from Nasopharyngeal swab specimens and should not be used as a sole basis for treatment. Nasal washings and aspirates are unacceptable for Xpert Xpress SARS-CoV-2/FLU/RSV testing.  Fact Sheet for Patients: BloggerCourse.com  Fact Sheet for Healthcare Providers: SeriousBroker.it  This test is not yet approved or cleared by the [place] FDA and has been authorized for detection and/or diagnosis of SARS-CoV-2 by FDA under an Emergency Use Authorization (EUA). This EUA will remain in effect (meaning this test can be used)  for the duration of the COVID-19 declaration under Section 564(b)(1) of the Act, 21 U.S.C. section 360bbb-3(b)(1), unless the authorization is terminated or revoked.  Performed at [facility], [address] [place], [place] [address]      Labs:  CBC: Recent Labs  Lab 04/21/21 0403 04/22/21 0151 04/23/21 0211 04/24/21 0341 04/25/21 0457 04/26/21 0243  WBC 11.6* 6.3 13.1* 9.1 9.5 10.0  NEUTROABS 9.4*  --   --   --   --   --   HGB 11.7* 11.1* 12.2 11.6* 11.0* 10.8*  HCT 36.9 34.9* 38.0 36.4 33.4* 33.8*  MCV 97.4 96.4 95.5 95.0 95.4 96.0  PLT 219  194 230 218 199 199   BMP &GFR Recent Labs  Lab 04/22/21 0151 04/23/21 0211 04/24/21 0341 04/25/21 0457 04/26/21 0243  NA 136 138 138 136 135  K 4.7 4.3 4.2 4.4 4.3  CL 103 104 104 104 105  CO2 24 22 25 25 23   GLUCOSE 161* 103* 149* 163* 131*  BUN 15 22 19 13 14   CREATININE 1.28* 1.37* 1.18* 1.07* 0.99  CALCIUM 8.6* 8.9 8.6* 8.4* 8.5*  MG  --   --   --  2.0  --    Estimated Creatinine Clearance: 36 mL/min (by C-G formula based on SCr of 0.99 mg/dL). Liver & Pancreas: No results for input(s): AST, ALT, ALKPHOS, BILITOT, PROT, ALBUMIN in the last 168 hours. No results for input(s): LIPASE, AMYLASE in the last 168 hours. No results for input(s): AMMONIA in the last 168 hours. Diabetic: No results for input(s): HGBA1C in the last 72 hours. Recent Labs  Lab 04/25/21 1125 04/25/21 1605 04/25/21 2001 04/26/21 0640 04/26/21 1203  GLUCAP 127* 111* 124* 101* 139*   Cardiac Enzymes: No results for input(s): CKTOTAL, CKMB, CKMBINDEX, TROPONINI in the last 168 hours. No results for input(s): PROBNP in the last 8760 hours. Coagulation Profile: Recent Labs  Lab 04/21/21 0403  INR 1.6*   Thyroid Function Tests: No results for input(s): TSH, T4TOTAL, FREET4, T3FREE, THYROIDAB in the last 72 hours. Lipid Profile: No results for input(s): CHOL, HDL, LDLCALC, TRIG, CHOLHDL, LDLDIRECT in the last 72 hours. Anemia  Panel: No results for input(s): VITAMINB12, FOLATE, FERRITIN, TIBC, IRON, RETICCTPCT in the last 72 hours. Urine analysis:    Component Value Date/Time   COLORURINE YELLOW 04/23/2021 0905   APPEARANCEUR CLEAR 04/23/2021 0905   LABSPEC 1.013 04/23/2021 0905   PHURINE 5.0 04/23/2021 0905   GLUCOSEU NEGATIVE 04/23/2021 0905   HGBUR NEGATIVE 04/23/2021 0905   BILIRUBINUR NEGATIVE 04/23/2021 0905   KETONESUR NEGATIVE 04/23/2021 0905   PROTEINUR NEGATIVE 04/23/2021 0905   NITRITE NEGATIVE 04/23/2021 0905   LEUKOCYTESUR MODERATE (A) 04/23/2021 0905   Sepsis Labs: Invalid input(s): PROCALCITONIN, LACTICIDVEN   Time coordinating discharge: 45 minutes  SIGNED:  Almon Herculesaye T Jayanth Szczesniak, MD  [facility] 04/26/2021, 1:23 PM

## 2021-05-08 ENCOUNTER — Other Ambulatory Visit: Payer: Self-pay

## 2021-05-08 ENCOUNTER — Ambulatory Visit (INDEPENDENT_AMBULATORY_CARE_PROVIDER_SITE_OTHER): Payer: Medicare Other | Admitting: Physician Assistant

## 2021-05-08 ENCOUNTER — Encounter: Payer: Self-pay | Admitting: Physician Assistant

## 2021-05-08 DIAGNOSIS — Z96649 Presence of unspecified artificial hip joint: Secondary | ICD-10-CM

## 2021-05-08 NOTE — Progress Notes (Signed)
HPI: Ms. Kerry Carlson returns today status post right hip hemiarthroplasty 04/24/2021.  Patient is at a skilled facility she is doing rehab there.  Her son who is with her today states that she is up and walking with a walker.  She is taking tramadol for pain.  She is on chronic Xarelto.  She is complaining no pain.  Physical exam: General well-developed well-nourished female no acute distress. Right hip excellent range of motion without pain.  Bilateral calf supple nontender.  Dorsiflexion plantarflexion bilateral ankles intact.  She is able to ambulate with minimal assistance to the wheelchair from the table.  Surgical incisions healing well no signs of infection or dehiscence.  Staples removed Steri-Strips applied.  Impression: Status post right hip hemiarthroplasty 04/24/2021  Plan: She will continue work with therapy on range of motion strengthening gait and balance.  She is able to get the incision wet in the shower.  She will follow-up with [place] in 1 month sooner if there is any questions concerns.  Again she is on chronic Xarelto.  Questions were encouraged and answered at length today.

## 2021-05-22 ENCOUNTER — Telehealth: Payer: Self-pay

## 2021-05-22 NOTE — Telephone Encounter (Signed)
-----   Message from Andreas Blower sent at 05/19/2021 10:37 AM EST ----- Regarding: Pacemaker FYI Patient had 6 month f/u and pacemaker ck on 05/24/2021 with JG. Pt fell and broke her hip and is in rehab. Her daughter has r/s to 06/19/2021

## 2021-05-23 NOTE — Telephone Encounter (Signed)
-----   Message from Beverly Gill sent at 05/19/2021 10:37 AM EST ----- °Regarding: Pacemaker °FYI °Patient had 6 month f/u and pacemaker ck on 05/24/2021 with JG. Pt fell and broke her hip and is in rehab. Her daughter has r/s to 06/19/2021 ° °

## 2021-05-23 NOTE — Telephone Encounter (Signed)
[employer] rep has been contacted about rescheduled appointment.

## 2021-05-24 ENCOUNTER — Ambulatory Visit: Payer: PRIVATE HEALTH INSURANCE | Admitting: Cardiology

## 2021-05-24 ENCOUNTER — Other Ambulatory Visit: Payer: Self-pay | Admitting: Cardiology

## 2021-05-26 ENCOUNTER — Telehealth: Payer: Self-pay

## 2021-05-26 ENCOUNTER — Encounter: Payer: Self-pay | Admitting: Cardiology

## 2021-05-26 NOTE — Telephone Encounter (Signed)
Patient daughter in law Lawson Fiscal called and stated that patient recently had a fall and broke her hip and was in a rehab facility. She was on Atenolol 100mg  BID and after going to the rehab on 04/26/2021 and was released on the 1st of February. During that time the facility was only giving her 50mg  BID. Patient is now home and March wants to know if she needs to continue that dosage until she sees you on the 27th? Please advise.

## 2021-05-26 NOTE — Telephone Encounter (Signed)
I sent her a MyChart message that it is okay to continue lower dose

## 2021-05-29 NOTE — Telephone Encounter (Signed)
This message was answered in MyChart directly to patient. Patient aware.

## 2021-06-05 ENCOUNTER — Encounter: Payer: PRIVATE HEALTH INSURANCE | Admitting: Physician Assistant

## 2021-06-19 ENCOUNTER — Ambulatory Visit: Payer: Medicare Other | Admitting: Cardiology

## 2021-06-19 ENCOUNTER — Encounter: Payer: Self-pay | Admitting: Cardiology

## 2021-06-19 ENCOUNTER — Other Ambulatory Visit: Payer: Self-pay

## 2021-06-19 VITALS — BP 123/77 | HR 80 | Temp 97.8°F | Resp 16 | Ht 66.0 in | Wt 158.4 lb

## 2021-06-19 DIAGNOSIS — N1831 Chronic kidney disease, stage 3a: Secondary | ICD-10-CM

## 2021-06-19 DIAGNOSIS — I4821 Permanent atrial fibrillation: Secondary | ICD-10-CM

## 2021-06-19 DIAGNOSIS — Z45018 Encounter for adjustment and management of other part of cardiac pacemaker: Secondary | ICD-10-CM

## 2021-06-19 DIAGNOSIS — Z950 Presence of cardiac pacemaker: Secondary | ICD-10-CM

## 2021-06-19 DIAGNOSIS — I441 Atrioventricular block, second degree: Secondary | ICD-10-CM

## 2021-06-19 NOTE — Progress Notes (Signed)
Primary Physician/Referring:  Haywood Pao, MD  Patient ID: [name], female    DOB: 08/15/1925, 88 y.o.   MRN: [id number]  Chief Complaint  Patient presents with   Hypertension   Pacemaker Check    Medtronic   HPI:    Letonia Nicodemus Deperalta  is a 88 y.o. permanent atrial fibrillation, hypertension, diabetes with diabetic polyneuropathy, diabetic nephropathy stage IIIa-b, now diet controlled diabetes, hyperlipidemia and Mobitz 2 AV block and permanent atrial fibrillation SP MRI compatible Medtronic single-chamber pacemaker implanted 07/20/2016 while in [place]. Patient was admitted to the hospital after a fall on 04/21/2021 and underwent right hip hemiarthroplasty, fortunately has recuperated well. She has not had any syncope, denies chest pain or dyspnea.  Past Medical History:  Diagnosis Date   Atrial fibrillation ([place])    Diabetes mellitus without complication ([place])    Type 2   Hyperlipidemia    Hypertension    Pacemaker - Medtronic Azure single chamber pacemaker 07/12/2016. 05/23/2020   Second degree Mobitz II AV block 05/23/2020   Thyroid disease    Hypothyroid   Social History   Tobacco Use   Smoking status: Never   Smokeless tobacco: Never  Substance Use Topics   Alcohol use: No   Marital Status: Widowed  ROS  Review of Systems  Cardiovascular:  Negative for chest pain, dyspnea on exertion and leg swelling.  Objective  Blood pressure 123/77, pulse 80, temperature 97.8 F (36.6 C), temperature source Temporal, resp. rate 16, height 5\' 6"  (1.676 m), weight 158 lb 6.4 oz (71.8 kg), SpO2 98 %.  Vitals with BMI 06/19/2021 04/26/2021 04/25/2021  Height 5\' 6"  - -  Weight 158 lbs 6 oz - -  BMI [id number] - -  Systolic [id number]  Diastolic 77 65 89  Pulse 80 67 80     Physical Exam Cardiovascular:     Rate and Rhythm: Normal rate and regular rhythm.     Pulses: Intact distal pulses.     Heart sounds: Normal heart sounds. No murmur heard.   No gallop.     Comments: No  leg edema, no JVD. Pulmonary:     Effort: Pulmonary effort is normal.     Breath sounds: Normal breath sounds.  Abdominal:     General: Bowel sounds are normal.     Palpations: Abdomen is soft.   Laboratory examination:   CMP Latest Ref Rng & Units 04/26/2021 04/25/2021 04/24/2021  Glucose 70 - 99 mg/dL 131(H) 163(H) 149(H)  BUN 8 - 23 mg/dL 14 13 19   Creatinine 0.44 - 1.00 mg/dL 0.99 1.07(H) 1.18(H)  Sodium 135 - 145 mmol/L 135 136 138  Potassium 3.5 - 5.1 mmol/L 4.3 4.4 4.2  Chloride 98 - 111 mmol/L 105 104 104  CO2 22 - 32 mmol/L 23 25 25   Calcium 8.9 - 10.3 mg/dL 8.5(L) 8.4(L) 8.6(L)   CBC Latest Ref Rng & Units 04/26/2021 04/25/2021 04/24/2021  WBC 4.0 - 10.5 K/uL 10.0 9.5 9.1  Hemoglobin 12.0 - 15.0 g/dL 10.8(L) 11.0(L) 11.6(L)  Hematocrit 36.0 - 46.0 % 33.8(L) 33.4(L) 36.4  Platelets 150 - 400 K/uL 199 199 218    External labs:   Cholesterol, total 129.000 m 10/08/2019 HDL 44 MG/DL 10/08/2019 LDL 68.000 mg 10/08/2019 Triglycerides 86.000 10/08/2019  A1C 5.9% % 10/08/2019 TSH 0.820 10/08/2019   Hemoglobin 13.400 g/d 10/08/2019  Creatinine, Serum 1.600 mg/ 10/08/2019 Potassium 3.800 08/21/2016 Magnesium N/D ALT (SGPT) 6.000 units 10/08/2019  Medications and allergies   Allergies  Allergen Reactions   Aleve [Naproxen] Hives   Aspirin Nausea And Vomiting   Penicillins Hives    Had a really long time ago, caused hives Has patient had a PCN reaction causing immediate rash, facial/tongue/throat swelling, SOB or lightheadedness with hypotension: unknown Has patient had a PCN reaction causing severe rash involving mucus membranes or skin necrosis: unknown Has patient had a PCN reaction that required hospitalization unknown Has patient had a PCN reaction occurring within the last 10 years:no If all of the above answers are "NO", then may proceed with Cephalosporin use.      Current Outpatient Medications:    acetaminophen (TYLENOL) 500 MG tablet, Take 2 tablets (1,000 mg total) by  mouth every 8 (eight) hours for 5 days, THEN 2 tablets (1,000 mg total) every 8 (eight) hours as needed., Disp: 30 tablet, Rfl: 0   atenolol (TENORMIN) 100 MG tablet, Take 1 tablet (100 mg total) by mouth 2 (two) times daily. (Patient taking differently: Take 50 mg by mouth 2 (two) times daily.), Disp: , Rfl:    levothyroxine (SYNTHROID) 100 MCG tablet, Take 100 mcg by mouth daily before breakfast. , Disp: , Rfl:    meclizine (ANTIVERT) 25 MG tablet, Take 25 mg by mouth as needed., Disp: , Rfl:    Multiple Vitamin (MULTIVITAMIN WITH MINERALS) TABS tablet, Take 1 tablet by mouth daily., Disp: , Rfl:    pravastatin (PRAVACHOL) 10 MG tablet, Take 10 mg by mouth daily., Disp: , Rfl:    Rivaroxaban (XARELTO) 15 MG TABS tablet, Take 15 mg by mouth daily with supper., Disp: , Rfl:    traMADol (ULTRAM) 50 MG tablet, Take 1-2 tablets (50-100 mg total) by mouth every 6 (six) hours as needed for moderate pain., Disp: 30 tablet, Rfl: 0   Device Clinic: Medtronic Azure MRI single chamber pacemaker 07/12/2016   Scheduled  In office pacemaker check 06/19/21  Single (S)/Dual (D)/BV: S.  Presenting VVIR - 60  AF, VS @ 68/min. Pacemaker dependant:  No. Underlying VS 77/min. AP NA, VP 29%.Marland Kitchen HVR 0. Longevity 10 Years. Magnet rate: >85%. Lead measurements: Stable.Marland Kitchen Histogram: Low (L)/normal (N)/high (H)  Normal. Patient activity Low <1 hour/day.   Observations: Normal pacemaker function. Changes: None.    Cardiac Studies:   Echocardiogram done in [place] 6 months ago, report not available.  EKG  EKG 11/06/2018: Probably atrial fibrillation and ventricularly paced rhythm.  No further analysis.  Assessment     ICD-10-CM   1. Encounter for care of pacemaker  Z45.018     2. Pacemaker - Medtronic Azure single chamber pacemaker 07/12/2016.  Z95.0     3. Second degree Mobitz II AV block  I44.1     4. Permanent atrial fibrillation (HCC)  I48.21     5. Stage 3a chronic kidney disease (HCC)  N18.31        Medications Discontinued During This Encounter  Medication Reason   famotidine (PEPCID) 20 MG tablet Patient Preference   feeding supplement (ENSURE ENLIVE / ENSURE PLUS) LIQD Patient Preference    LOW BP  CHA2DS2-VASc Score is 5.  Yearly risk of stroke: 7.2% (F, A, HTN, DM).  Score of 1=0.6; 2=2.2; 3=3.2; 4=4.8; 5=7.2; 6=9.8; 7=>9.8) -(CHF; HTN; vasc disease DM,  Female = 1; Age <65 =0; 65-74 = 1,  >75 =2; stroke/embolism= 2).    Recommendations:   SWAY MANGAR  is a 88 y.o. permanent atrial fibrillation, hypertension, diabetes with diabetic polyneuropathy, diabetic nephropathy stage IIIa-b, now diet controlled diabetes, hyperlipidemia  and Mobitz 2 AV block and permanent atrial fibrillation SP MRI compatible Medtronic single-chamber pacemaker implanted 07/20/2016 while in [place]. Patient was admitted to the hospital after a fall on 04/21/2021 and underwent right hip hemiarthroplasty, fortunately has recuperated well.  Pacemaker interrogation reveals normally functioning pacemaker.  Her battery longevity is increased since we reduced her basal rate previously from 70 to 60 bpm, she has known underlying inherent rhythm.  I reviewed her records from the hospitalization, renal function has remained stable, CBC has remained stable as well.  She is tolerating anticoagulation without bleeding diathesis.  She has not had any remote pacemaker transmissions, her son will check the collections today and I have given them information regarding Medtronic customer service.      Adrian Prows, MD, [facility] 06/19/2021, 4:24 PM Office: [phone]

## 2021-07-04 ENCOUNTER — Encounter: Payer: Self-pay | Admitting: Cardiology

## 2021-07-04 NOTE — Telephone Encounter (Signed)
From pt

## 2022-05-01 ENCOUNTER — Emergency Department (HOSPITAL_COMMUNITY): Payer: Medicare Other

## 2022-05-01 ENCOUNTER — Encounter (HOSPITAL_COMMUNITY): Payer: Self-pay

## 2022-05-01 ENCOUNTER — Other Ambulatory Visit: Payer: Self-pay

## 2022-05-01 ENCOUNTER — Emergency Department (HOSPITAL_COMMUNITY)
Admission: EM | Admit: 2022-05-01 | Discharge: 2022-05-01 | Disposition: A | Discharge status: Home / Self Care | Payer: Medicare Other | Attending: Student | Admitting: Student

## 2022-05-01 DIAGNOSIS — I4821 Permanent atrial fibrillation: Secondary | ICD-10-CM | POA: Insufficient documentation

## 2022-05-01 DIAGNOSIS — Z1152 Encounter for screening for COVID-19: Secondary | ICD-10-CM | POA: Insufficient documentation

## 2022-05-01 DIAGNOSIS — E039 Hypothyroidism, unspecified: Secondary | ICD-10-CM | POA: Insufficient documentation

## 2022-05-01 DIAGNOSIS — E119 Type 2 diabetes mellitus without complications: Secondary | ICD-10-CM | POA: Diagnosis not present

## 2022-05-01 DIAGNOSIS — I10 Essential (primary) hypertension: Secondary | ICD-10-CM | POA: Insufficient documentation

## 2022-05-01 DIAGNOSIS — Z79890 Hormone replacement therapy (postmenopausal): Secondary | ICD-10-CM | POA: Insufficient documentation

## 2022-05-01 DIAGNOSIS — I6782 Cerebral ischemia: Secondary | ICD-10-CM | POA: Diagnosis not present

## 2022-05-01 DIAGNOSIS — Z79899 Other long term (current) drug therapy: Secondary | ICD-10-CM | POA: Insufficient documentation

## 2022-05-01 DIAGNOSIS — Z950 Presence of cardiac pacemaker: Secondary | ICD-10-CM | POA: Insufficient documentation

## 2022-05-01 DIAGNOSIS — Z7901 Long term (current) use of anticoagulants: Secondary | ICD-10-CM | POA: Insufficient documentation

## 2022-05-01 DIAGNOSIS — R4182 Altered mental status, unspecified: Secondary | ICD-10-CM | POA: Diagnosis not present

## 2022-05-01 DIAGNOSIS — Z87440 Personal history of urinary (tract) infections: Secondary | ICD-10-CM | POA: Diagnosis not present

## 2022-05-01 DIAGNOSIS — E860 Dehydration: Secondary | ICD-10-CM

## 2022-05-01 LAB — CBC WITH DIFFERENTIAL/PLATELET
Abs Immature Granulocytes: 0.02 10*3/uL (ref 0.00–0.07)
Basophils Absolute: 0 10*3/uL (ref 0.0–0.1)
Basophils Relative: 0 %
Eosinophils Absolute: 0.1 10*3/uL (ref 0.0–0.5)
Eosinophils Relative: 1 %
HCT: 39.2 % (ref 36.0–46.0)
Hemoglobin: 12.8 g/dL (ref 12.0–15.0)
Immature Granulocytes: 0 %
Lymphocytes Relative: 31 %
Lymphs Abs: 2.2 10*3/uL (ref 0.7–4.0)
MCH: 31.7 pg (ref 26.0–34.0)
MCHC: 32.7 g/dL (ref 30.0–36.0)
MCV: 97 fL (ref 80.0–100.0)
Monocytes Absolute: 0.4 10*3/uL (ref 0.1–1.0)
Monocytes Relative: 6 %
Neutro Abs: 4.3 10*3/uL (ref 1.7–7.7)
Neutrophils Relative %: 62 %
Platelets: 227 10*3/uL (ref 150–400)
RBC: 4.04 MIL/uL (ref 3.87–5.11)
RDW: 12.7 % (ref 11.5–15.5)
WBC: 7.1 10*3/uL (ref 4.0–10.5)
nRBC: 0 % (ref 0.0–0.2)

## 2022-05-01 LAB — COMPREHENSIVE METABOLIC PANEL
ALT: 12 U/L (ref 0–44)
AST: 17 U/L (ref 15–41)
Albumin: 3.9 g/dL (ref 3.5–5.0)
Alkaline Phosphatase: 43 U/L (ref 38–126)
Anion gap: 8 (ref 5–15)
BUN: 16 mg/dL (ref 8–23)
CO2: 27 mmol/L (ref 22–32)
Calcium: 9 mg/dL (ref 8.9–10.3)
Chloride: 101 mmol/L (ref 98–111)
Creatinine, Ser: 1.16 mg/dL — ABNORMAL HIGH (ref 0.44–1.00)
GFR, Estimated: 44 mL/min — ABNORMAL LOW (ref >60)
Glucose, Bld: 138 mg/dL — ABNORMAL HIGH (ref 70–99)
Potassium: 3.9 mmol/L (ref 3.5–5.1)
Sodium: 136 mmol/L (ref 135–145)
Total Bilirubin: 1 mg/dL (ref 0.3–1.2)
Total Protein: 6.7 g/dL (ref 6.5–8.1)

## 2022-05-01 LAB — URINALYSIS, ROUTINE W REFLEX MICROSCOPIC
Bacteria, UA: NONE SEEN
Bilirubin Urine: NEGATIVE
Glucose, UA: NEGATIVE mg/dL
Hgb urine dipstick: NEGATIVE
Ketones, ur: 5 mg/dL — AB
Nitrite: NEGATIVE
Protein, ur: NEGATIVE mg/dL
Specific Gravity, Urine: 1.014 (ref 1.005–1.030)
pH: 5 (ref 5.0–8.0)

## 2022-05-01 LAB — RESP PANEL BY RT-PCR (RSV, FLU A&B, COVID)  RVPGX2
Influenza A by PCR: NEGATIVE
Influenza B by PCR: NEGATIVE
Resp Syncytial Virus by PCR: NEGATIVE
SARS Coronavirus 2 by RT PCR: NEGATIVE

## 2022-05-01 LAB — TSH: TSH: 10.1 u[IU]/mL — ABNORMAL HIGH (ref 0.350–4.500)

## 2022-05-01 LAB — TROPONIN I (HIGH SENSITIVITY)
Troponin I (High Sensitivity): 6 ng/L (ref <18)
Troponin I (High Sensitivity): 5 ng/L (ref <18)

## 2022-05-01 MED ORDER — SODIUM CHLORIDE 0.9 % IV BOLUS
500.0000 mL | Freq: Once | INTRAVENOUS | Status: AC
  Administered 2022-05-01: 500 mL via INTRAVENOUS

## 2022-05-01 MED ORDER — SODIUM CHLORIDE 0.9 % IV SOLN: INTRAVENOUS | Status: DC

## 2022-05-01 NOTE — ED Notes (Signed)
Upon this RN's assessment patient is arousable to speech. She appears sleepy but easily aroused. She is alert and oriented to person and situation but disoriented to time and place. Her granddaughter, Lilia Pro is at bedside. Pt and Lilia Pro were both updated. Pt in no acute distress. She is pleasant and denies pain. She has a pacemaker (present since 2018). HX of Afib. Purewick  in place (CDI). Will perform bladder scan since no output since arrival and need for UA. Bryson Corona Edd Fabian

## 2022-05-01 NOTE — ED Triage Notes (Signed)
Coming from home. Dx with UTI Thursday, family states she's gotten worse since then, lethargic. Hx hypertention, diabetes, pace maker. Per EMS, BP 163/95, HR 95, 02 100% RM no n/v/d. Has some dementia. BS 204, 20g in left AC, temp 98.7

## 2022-05-01 NOTE — ED Notes (Signed)
Pt walked in room with walker with steady gait. Son taking patient home. His concern is getting patient up the stairs at her home. Non emergency fire dept number was provided in case they need assistance. Bryson Corona Edd Fabian

## 2022-05-01 NOTE — Discharge Instructions (Signed)
No evidence of urinary tract infection here today.  There was some evidence of some mild dehydration.  Seems to be improving with some IV fluids.  Return for any new or worse symptoms.  Also COVID influenza RSV was negative chest x-ray was negative and head CT was negative for any acute findings.  And labs without significant abnormalities.

## 2022-05-01 NOTE — ED Provider Notes (Signed)
Results for orders placed or performed during the hospital encounter of 05/01/22  Blood culture (routine x 2)   Specimen: Right Antecubital; Blood  Result Value Ref Range   Specimen Description RIGHT ANTECUBITAL    Special Requests      BOTTLES DRAWN AEROBIC AND ANAEROBIC Blood Culture adequate volume Performed at [facility], [address] [place], [place] [address]    Culture PENDING    Report Status PENDING   Blood culture (routine x 2)   Specimen: BLOOD RIGHT HAND  Result Value Ref Range   Specimen Description BLOOD RIGHT HAND    Special Requests      BOTTLES DRAWN AEROBIC ONLY Blood Culture adequate volume Performed at [facility], [address], [place], [place] [address]    Culture PENDING    Report Status PENDING   Resp panel by RT-PCR (RSV, Flu A&B, Covid) Anterior Nasal Swab   Specimen: Anterior Nasal Swab  Result Value Ref Range   SARS Coronavirus 2 by RT PCR NEGATIVE NEGATIVE   Influenza A by PCR NEGATIVE NEGATIVE   Influenza B by PCR NEGATIVE NEGATIVE   Resp Syncytial Virus by PCR NEGATIVE NEGATIVE  Comprehensive metabolic panel  Result Value Ref Range   Sodium 136 135 - 145 mmol/L   Potassium 3.9 3.5 - 5.1 mmol/L   Chloride 101 98 - 111 mmol/L   CO2 27 22 - 32 mmol/L   Glucose, Bld 138 (H) 70 - 99 mg/dL   BUN 16 8 - 23 mg/dL   Creatinine, Ser 1.16 (H) 0.44 - 1.00 mg/dL   Calcium 9.0 8.9 - 10.3 mg/dL   Total Protein 6.7 6.5 - 8.1 g/dL   Albumin 3.9 3.5 - 5.0 g/dL   AST 17 15 - 41 U/L   ALT 12 0 - 44 U/L   Alkaline Phosphatase 43 38 - 126 U/L   Total Bilirubin 1.0 0.3 - 1.2 mg/dL   GFR, Estimated 44 (L) >60 mL/min   Anion gap 8 5 - 15  CBC with Differential  Result Value Ref Range   WBC 7.1 4.0 - 10.5 K/uL   RBC 4.04 3.87 - 5.11 MIL/uL   Hemoglobin 12.8 12.0 - 15.0 g/dL   HCT 39.2 36.0 - 46.0 %   MCV 97.0 80.0 - 100.0 fL   MCH 31.7 26.0 - 34.0 pg   MCHC 32.7 30.0 - 36.0 g/dL   RDW 12.7 11.5 - 15.5 %   Platelets 227 150 - 400 K/uL   nRBC 0.0 0.0  - 0.2 %   Neutrophils Relative % 62 %   Neutro Abs 4.3 1.7 - 7.7 K/uL   Lymphocytes Relative 31 %   Lymphs Abs 2.2 0.7 - 4.0 K/uL   Monocytes Relative 6 %   Monocytes Absolute 0.4 0.1 - 1.0 K/uL   Eosinophils Relative 1 %   Eosinophils Absolute 0.1 0.0 - 0.5 K/uL   Basophils Relative 0 %   Basophils Absolute 0.0 0.0 - 0.1 K/uL   Immature Granulocytes 0 %   Abs Immature Granulocytes 0.02 0.00 - 0.07 K/uL  Urinalysis, Routine w reflex microscopic Urine, Clean Catch  Result Value Ref Range   Color, Urine YELLOW YELLOW   APPearance CLEAR CLEAR   Specific Gravity, Urine 1.014 1.005 - 1.030   pH 5.0 5.0 - 8.0   Glucose, UA NEGATIVE NEGATIVE mg/dL   Hgb urine dipstick NEGATIVE NEGATIVE   Bilirubin Urine NEGATIVE NEGATIVE   Ketones, ur 5 (A) NEGATIVE mg/dL   Protein, ur NEGATIVE  NEGATIVE mg/dL   Nitrite NEGATIVE NEGATIVE   Leukocytes,Ua TRACE (A) NEGATIVE   RBC / HPF 0-5 0 - 5 RBC/hpf   WBC, UA 0-5 0 - 5 WBC/hpf   Bacteria, UA NONE SEEN NONE SEEN   Squamous Epithelial / HPF 0-5 0 - 5 /HPF  TSH  Result Value Ref Range   TSH 10.100 (H) 0.350 - 4.500 uIU/mL  Troponin I (High Sensitivity)  Result Value Ref Range   Troponin I (High Sensitivity) 6 <18 ng/L  Troponin I (High Sensitivity)  Result Value Ref Range   Troponin I (High Sensitivity) 5 <18 ng/L   DG Chest Port 1 View  Result Date: 05/01/2022 CLINICAL DATA:  Altered mental status EXAM: PORTABLE CHEST 1 VIEW COMPARISON:  04/21/2021 FINDINGS: Left-sided pacing device as before. Cardiomegaly. No acute airspace disease or effusion. No pneumothorax. Aortic atherosclerosis IMPRESSION: No active disease. Cardiomegaly. Electronically Signed   By: Jasmine Pang M.D.   On: 05/01/2022 18:02   CT Head Wo Contrast  Result Date: 05/01/2022 CLINICAL DATA:  Provided history: Delirium. Additional history provided: Worsening lethargy for 4 days. EXAM: CT HEAD WITHOUT CONTRAST TECHNIQUE: Contiguous axial images were obtained from the base of the  skull through the vertex without intravenous contrast. RADIATION DOSE REDUCTION: This exam was performed according to the departmental dose-optimization program which includes automated exposure control, adjustment of the mA and/or kV according to patient size and/or use of iterative reconstruction technique. COMPARISON:  Prior head CT examinations 05/19/2020 and earlier. FINDINGS: Brain: Generalized cerebral atrophy. Redemonstrated chronic lacunar infarct within the right caudate nucleus (series 4, image 32) (series 5, image 20). Minimal patchy and ill-defined hypoattenuation within the cerebral white matter, nonspecific but compatible with chronic small vessel ischemic disease. There is no acute intracranial hemorrhage. No demarcated cortical infarct. No extra-axial fluid collection. No evidence of an intracranial mass. No midline shift. Vascular: No hyperdense vessel. Atherosclerotic calcifications. Skull: No fracture or aggressive osseous lesion. Sinuses/Orbits: No mass or acute finding within the imaged orbits. No significant paranasal sinus disease at the imaged levels. IMPRESSION: 1. No evidence of acute intracranial abnormality. 2. Chronic small vessel ischemic disease and cerebral atrophy, as described. Electronically Signed   By: Jackey Loge D.O.   On: 05/01/2022 16:02    Patient presenting with altered mental status also appears to be a little bit dehydrated.  Patient given some IV fluids.  Not able to do MRI because of her pacemaker.  But with the IV fluids patient slowly became more alert throughout the day.  Not back to baseline but patient's family is comfortable with taking her home.  They do not want placement for her.  Chest x-ray negative for COVID influenza RSV negative.  Labs without significant abnormalities.  And most importantly they felt she had a urinary tract infection.  But no evidence of infection on today's urinalysis.  Patient's vital signs stable.  Patient stable for discharge  home close follow-up and precautions provided on when to return.     Vanetta Mulders, MD 05/01/22 [id number]

## 2022-05-01 NOTE — ED Notes (Signed)
Output of 172ml of straw/ clear urine. No in and out cath performed. Bryson Corona Edd Fabian

## 2022-05-01 NOTE — ED Notes (Signed)
EDP at bedside. Kerry Corona Edd Fabian

## 2022-05-01 NOTE — ED Notes (Signed)
Pt transported to CT. Kerry Carlson Joziyah Roblero,RN 

## 2022-05-01 NOTE — ED Provider Notes (Signed)
[facility] EMERGENCY DEPARTMENT Provider Note  CSN: [id number] Arrival date & time: 05/01/22 1310  Chief Complaint(s) Weakness  HPI Kerry Carlson is a 89 y.o. female with PMH A-fib, T2DM, HTN, HLD, Mobitz 2 with pacemaker placement, hypothyroidism who presents emergency department for evaluation of delirium and altered mental status.  History taken from patient's son who states that 6 days ago the patient was diagnosed with a UTI and she has been compliant with her antibiotic therapy but has clinically worsened.  Over the last 48 hours the patient has been significantly more delirious and is altered here in the emergency department today.  Additional history unable to be obtained as the patient is altered.   Past Medical History Past Medical History:  Diagnosis Date   Atrial fibrillation (HCC)    Diabetes mellitus without complication (HCC)    Type 2   Hyperlipidemia    Hypertension    Pacemaker - Medtronic Azure single chamber pacemaker 07/12/2016. 05/23/2020   Second degree Mobitz II AV block 05/23/2020   Thyroid disease    Hypothyroid   Patient Active Problem List   Diagnosis Date Noted   AKI (acute kidney injury) (HCC) 04/22/2021   Closed right hip fracture, initial encounter (HCC) 04/21/2021   Diabetes mellitus without complication (HCC) 04/21/2021   Hyperlipidemia 04/21/2021   Hypertension 04/21/2021   Thyroid disease 04/21/2021   DNR (do not resuscitate) 04/21/2021   Acute lower UTI 04/21/2021   Encounter for care of pacemaker 05/23/2020   Pacemaker - Medtronic Azure single chamber pacemaker 07/12/2016. 05/23/2020   Second degree Mobitz II AV block 05/23/2020   Permanent atrial fibrillation (HCC) 05/23/2020   Home Medication(s) Prior to Admission medications   Medication Sig Start Date End Date Taking? Authorizing Provider  acetaminophen (TYLENOL) 500 MG tablet Take 1,000 mg by mouth 2 (two) times daily. 04/26/21  Yes [provider]  atenolol (TENORMIN) 100  MG tablet Take 1 tablet (100 mg total) by mouth 2 (two) times daily. 04/26/21  Yes Almon Hercules, MD  famotidine (PEPCID) 20 MG tablet Take 20 mg by mouth daily.   Yes [provider]  levothyroxine (SYNTHROID) 100 MCG tablet Take 100 mcg by mouth daily before breakfast.    Yes [provider]  Multiple Vitamin (MULTIVITAMIN WITH MINERALS) TABS tablet Take 1 tablet by mouth daily. 04/26/21  Yes Almon Hercules, MD  NAMENDA 10 MG tablet Take 10 mg by mouth 2 (two) times daily.   Yes [provider]  pravastatin (PRAVACHOL) 10 MG tablet Take 10 mg by mouth daily.   Yes [provider]  Rivaroxaban (XARELTO) 15 MG TABS tablet Take 15 mg by mouth daily with supper.   Yes [provider]  ciprofloxacin (CIPRO) 250 MG tablet Take 250 mg by mouth 2 (two) times daily. For 5 days start date 04/25/22 Patient not taking: Reported on 05/01/2022 04/25/22   [provider]  Past Surgical History Past Surgical History:  Procedure Laterality Date   ABDOMINAL HYSTERECTOMY     PACEMAKER INSERTION  10/11/2016   Medtronic Azure MRI single-chamber pacemaker in [facility]   TOTAL HIP ARTHROPLASTY Right 04/24/2021   Procedure: PARTIAL vs. TOTAL HIP ARTHROPLASTY ANTERIOR APPROACH;  Surgeon: Mcarthur Rossetti, MD;  Location: [place];  Service: Orthopedics;  Laterality: Right;   Family History Family History  Problem Relation Age of Onset   Stroke Mother    Stroke Father     Social History Social History   Tobacco Use   Smoking status: Never   Smokeless tobacco: Never  Vaping Use   Vaping Use: Never used  Substance Use Topics   Alcohol use: No   Drug use: Never   Allergies Aleve [naproxen], Aspirin, and Penicillins  Review of Systems Review of Systems  Unable to perform ROS: Mental status change    Physical Exam Vital Signs  I  have reviewed the triage vital signs BP 137/62   Pulse 77   Temp 97.8 F (36.6 C) (Oral)   Resp 14   SpO2 97%   Physical Exam Vitals and nursing note reviewed.  Constitutional:      General: She is not in acute distress.    Appearance: She is well-developed.  HENT:     Head: Normocephalic and atraumatic.  Eyes:     Conjunctiva/sclera: Conjunctivae normal.  Cardiovascular:     Rate and Rhythm: Normal rate and regular rhythm.     Heart sounds: No murmur heard. Pulmonary:     Effort: Pulmonary effort is normal. No respiratory distress.     Breath sounds: Normal breath sounds.  Abdominal:     Palpations: Abdomen is soft.     Tenderness: There is no abdominal tenderness.  Musculoskeletal:        General: No swelling.     Cervical back: Neck supple.  Skin:    General: Skin is warm and dry.     Capillary Refill: Capillary refill takes less than 2 seconds.  Neurological:     Mental Status: She is alert. She is disoriented.  Psychiatric:        Mood and Affect: Mood normal.     ED Results and Treatments Labs (all labs ordered are listed, but only abnormal results are displayed) Labs Reviewed  COMPREHENSIVE METABOLIC PANEL - Abnormal; Notable for the following components:      Result Value   Glucose, Bld 138 (*)    Creatinine, Ser 1.16 (*)    GFR, Estimated 44 (*)    All other components within normal limits  URINALYSIS, ROUTINE W REFLEX MICROSCOPIC - Abnormal; Notable for the following components:   Ketones, ur 5 (*)    Leukocytes,Ua TRACE (*)    All other components within normal limits  CULTURE, BLOOD (ROUTINE X 2)  CULTURE, BLOOD (ROUTINE X 2)  RESP PANEL BY RT-PCR (RSV, FLU A&B, COVID)  RVPGX2  CBC WITH DIFFERENTIAL/PLATELET  TROPONIN I (HIGH SENSITIVITY)  TROPONIN I (HIGH SENSITIVITY)  Radiology DG Chest Port 1 View  Result Date:  05/01/2022 CLINICAL DATA:  Altered mental status EXAM: PORTABLE CHEST 1 VIEW COMPARISON:  04/21/2021 FINDINGS: Left-sided pacing device as before. Cardiomegaly. No acute airspace disease or effusion. No pneumothorax. Aortic atherosclerosis IMPRESSION: No active disease. Cardiomegaly. Electronically Signed   By: Jasmine Pang M.D.   On: 05/01/2022 18:02   CT Head Wo Contrast  Result Date: 05/01/2022 CLINICAL DATA:  Provided history: Delirium. Additional history provided: Worsening lethargy for 4 days. EXAM: CT HEAD WITHOUT CONTRAST TECHNIQUE: Contiguous axial images were obtained from the base of the skull through the vertex without intravenous contrast. RADIATION DOSE REDUCTION: This exam was performed according to the departmental dose-optimization program which includes automated exposure control, adjustment of the mA and/or kV according to patient size and/or use of iterative reconstruction technique. COMPARISON:  Prior head CT examinations 05/19/2020 and earlier. FINDINGS: Brain: Generalized cerebral atrophy. Redemonstrated chronic lacunar infarct within the right caudate nucleus (series 4, image 32) (series 5, image 20). Minimal patchy and ill-defined hypoattenuation within the cerebral white matter, nonspecific but compatible with chronic small vessel ischemic disease. There is no acute intracranial hemorrhage. No demarcated cortical infarct. No extra-axial fluid collection. No evidence of an intracranial mass. No midline shift. Vascular: No hyperdense vessel. Atherosclerotic calcifications. Skull: No fracture or aggressive osseous lesion. Sinuses/Orbits: No mass or acute finding within the imaged orbits. No significant paranasal sinus disease at the imaged levels. IMPRESSION: 1. No evidence of acute intracranial abnormality. 2. Chronic small vessel ischemic disease and cerebral atrophy, as described. Electronically Signed   By: Jackey Loge D.O.   On: 05/01/2022 16:02    Pertinent labs & imaging results  that were available during my care of the patient were reviewed by me and considered in my medical decision making (see MDM for details).  Medications Ordered in ED Medications  0.9 %  sodium chloride infusion ( Intravenous New Bag/Given 05/01/22 1859)  sodium chloride 0.9 % bolus 500 mL (500 mLs Intravenous New Bag/Given 05/01/22 1914)                                                                                                                                     Procedures Procedures  (including critical care time)  Medical Decision Making / ED Course   This patient presents to the ED for concern of altered mental status, this involves an extensive number of treatment options, and is a complaint that carries with it a high risk of complications and morbidity.  The differential diagnosis includes failure of outpatient antibiotics, electrolyte abnormality, progression of underlying dementia, delirium, myxedema coma  MDM: Patient seen emergency room for evaluation of altered mental status.  Physical exam reveals a disoriented patient but is otherwise unremarkable.  Laboratory evaluation and imaging currently pending at time of signout.  Please see provider signout for continuation of workup.   Additional history obtained: -Additional history obtained from  son -External records from outside source obtained and reviewed including: Chart review including previous notes, labs, imaging, consultation notes   Lab Tests: -I ordered, reviewed, and interpreted labs.   The pertinent results include:   Labs Reviewed  COMPREHENSIVE METABOLIC PANEL - Abnormal; Notable for the following components:      Result Value   Glucose, Bld 138 (*)    Creatinine, Ser 1.16 (*)    GFR, Estimated 44 (*)    All other components within normal limits  URINALYSIS, ROUTINE W REFLEX MICROSCOPIC - Abnormal; Notable for the following components:   Ketones, ur 5 (*)    Leukocytes,Ua TRACE (*)    All other components  within normal limits  CULTURE, BLOOD (ROUTINE X 2)  CULTURE, BLOOD (ROUTINE X 2)  RESP PANEL BY RT-PCR (RSV, FLU A&B, COVID)  RVPGX2  CBC WITH DIFFERENTIAL/PLATELET  TROPONIN I (HIGH SENSITIVITY)  TROPONIN I (HIGH SENSITIVITY)     Imaging Studies ordered: I ordered imaging studies including chest x-ray and CT head and these are pending    Medicines ordered and prescription drug management: Meds ordered this encounter  Medications   sodium chloride 0.9 % bolus 500 mL   0.9 %  sodium chloride infusion    -I have reviewed the patients home medicines and have made adjustments as needed  Critical interventions none    Cardiac Monitoring: The patient was maintained on a cardiac monitor.  I personally viewed and interpreted the cardiac monitored which showed an underlying rhythm of: NSR  Social Determinants of Health:  Factors impacting patients care include: none   Reevaluation: After the interventions noted above, I reevaluated the patient and found that they have :stayed the same  Co morbidities that complicate the patient evaluation  Past Medical History:  Diagnosis Date   Atrial fibrillation (HCC)    Diabetes mellitus without complication (HCC)    Type 2   Hyperlipidemia    Hypertension    Pacemaker - Medtronic Azure single chamber pacemaker 07/12/2016. 05/23/2020   Second degree Mobitz II AV block 05/23/2020   Thyroid disease    Hypothyroid      Dispostion: I considered admission for this patient, and disposition pending completion of laboratory and imaging studies.  Please see provider signout for continuation of workup.     Final Clinical Impression(s) / ED Diagnoses Final diagnoses:  None     @PCDICTATION @    , MD 05/01/22 [phone]

## 2022-05-06 LAB — CULTURE, BLOOD (ROUTINE X 2)
Culture: NO GROWTH
Culture: NO GROWTH
Special Requests: ADEQUATE
Special Requests: ADEQUATE

## 2022-05-19 ENCOUNTER — Emergency Department (HOSPITAL_COMMUNITY)
Admission: EM | Admit: 2022-05-19 | Discharge: 2022-05-19 | Disposition: A | Discharge status: Home / Self Care | Payer: Medicare Other | Attending: Emergency Medicine | Admitting: Emergency Medicine

## 2022-05-19 ENCOUNTER — Emergency Department (HOSPITAL_COMMUNITY): Payer: Medicare Other

## 2022-05-19 ENCOUNTER — Encounter (HOSPITAL_COMMUNITY): Payer: Self-pay

## 2022-05-19 DIAGNOSIS — S0181XA Laceration without foreign body of other part of head, initial encounter: Secondary | ICD-10-CM | POA: Diagnosis not present

## 2022-05-19 DIAGNOSIS — I4891 Unspecified atrial fibrillation: Secondary | ICD-10-CM | POA: Insufficient documentation

## 2022-05-19 DIAGNOSIS — S0990XA Unspecified injury of head, initial encounter: Secondary | ICD-10-CM | POA: Diagnosis not present

## 2022-05-19 DIAGNOSIS — W19XXXA Unspecified fall, initial encounter: Secondary | ICD-10-CM | POA: Insufficient documentation

## 2022-05-19 DIAGNOSIS — Z7901 Long term (current) use of anticoagulants: Secondary | ICD-10-CM | POA: Insufficient documentation

## 2022-05-19 DIAGNOSIS — E039 Hypothyroidism, unspecified: Secondary | ICD-10-CM | POA: Insufficient documentation

## 2022-05-19 DIAGNOSIS — S0993XA Unspecified injury of face, initial encounter: Secondary | ICD-10-CM | POA: Diagnosis present

## 2022-05-19 LAB — CBC WITH DIFFERENTIAL/PLATELET
Abs Immature Granulocytes: 0.03 10*3/uL (ref 0.00–0.07)
Basophils Absolute: 0 10*3/uL (ref 0.0–0.1)
Basophils Relative: 0 %
Eosinophils Absolute: 0.1 10*3/uL (ref 0.0–0.5)
Eosinophils Relative: 2 %
HCT: 36.2 % (ref 36.0–46.0)
Hemoglobin: 11.8 g/dL — ABNORMAL LOW (ref 12.0–15.0)
Immature Granulocytes: 0 %
Lymphocytes Relative: 23 %
Lymphs Abs: 1.6 10*3/uL (ref 0.7–4.0)
MCH: 31.3 pg (ref 26.0–34.0)
MCHC: 32.6 g/dL (ref 30.0–36.0)
MCV: 96 fL (ref 80.0–100.0)
Monocytes Absolute: 0.5 10*3/uL (ref 0.1–1.0)
Monocytes Relative: 7 %
Neutro Abs: 4.5 10*3/uL (ref 1.7–7.7)
Neutrophils Relative %: 68 %
Platelets: 201 10*3/uL (ref 150–400)
RBC: 3.77 MIL/uL — ABNORMAL LOW (ref 3.87–5.11)
RDW: 12.4 % (ref 11.5–15.5)
WBC: 6.7 10*3/uL (ref 4.0–10.5)
nRBC: 0 % (ref 0.0–0.2)

## 2022-05-19 LAB — BASIC METABOLIC PANEL
Anion gap: 9 (ref 5–15)
BUN: 15 mg/dL (ref 8–23)
CO2: 27 mmol/L (ref 22–32)
Calcium: 8.8 mg/dL — ABNORMAL LOW (ref 8.9–10.3)
Chloride: 100 mmol/L (ref 98–111)
Creatinine, Ser: 1.08 mg/dL — ABNORMAL HIGH (ref 0.44–1.00)
GFR, Estimated: 48 mL/min — ABNORMAL LOW (ref >60)
Glucose, Bld: 234 mg/dL — ABNORMAL HIGH (ref 70–99)
Potassium: 4.6 mmol/L (ref 3.5–5.1)
Sodium: 136 mmol/L (ref 135–145)

## 2022-05-19 LAB — CBG MONITORING, ED: Glucose-Capillary: 247 mg/dL — ABNORMAL HIGH (ref 70–99)

## 2022-05-19 MED ORDER — CLINDAMYCIN HCL 300 MG PO CAPS: 300.0000 mg | ORAL_CAPSULE | Freq: Three times a day (TID) | ORAL | 0 refills | Status: AC

## 2022-05-19 MED ORDER — BACITRACIN ZINC 500 UNIT/GM EX OINT
TOPICAL_OINTMENT | Freq: Every day | CUTANEOUS | Status: AC
  Administered 2022-05-19: 1 via TOPICAL
  Filled 2022-05-19: qty 0.9

## 2022-05-19 MED ORDER — LIDOCAINE-EPINEPHRINE (PF) 1 %-1:200000 IJ SOLN
10.0000 mL | Freq: Once | INTRAMUSCULAR | Status: DC
  Filled 2022-05-19: qty 30

## 2022-05-19 NOTE — ED Notes (Signed)
Pt transported to CT via stretcher

## 2022-05-19 NOTE — Discharge Instructions (Addendum)
You are seen today for a laceration of your face after a fall.  Your imaging was all normal.  Your lab work was reassuring.  You had stitches placed in your upper lip.  The ones on the inside will dissolve on their own but the ones the outside need to be removed in 5 days either here or primary care doctor.

## 2022-05-19 NOTE — ED Provider Notes (Signed)
[place] EMERGENCY DEPARTMENT AT [facility] Provider Note   CSN: [id number] Arrival date & time: 05/19/22  1226     History  Chief Complaint  Patient presents with   Fall    Blood thinner    Kerry Carlson is a 89 y.o. female.  His past medical history of atrial fibrillation is on Xarelto.  She also has history of high cholesterol and hypothyroidism.  She presents the ER complaining of a fall with injury to the upper lip.  She lives at home with her son.  She states she was trying to get something out of the fridge by herself and fell down, she is not sure what happened, denies dizziness or loss of consciousness.  Her son states he found her on the ground and her false teeth had come out and were broken and laying distantly from her.  She is Bleeding from the upper lip.  She denies any other pain or injuries   Fall       Home Medications Prior to Admission medications   Medication Sig Start Date End Date Taking? Authorizing Provider  clindamycin (CLEOCIN) 300 MG capsule Take 1 capsule (300 mg total) by mouth 3 (three) times daily for 5 days. 05/19/22 05/24/22 Yes Keyon Winnick A, PA-C  acetaminophen (TYLENOL) 500 MG tablet Take 1,000 mg by mouth 2 (two) times daily. 04/26/21   [provider]  atenolol (TENORMIN) 100 MG tablet Take 1 tablet (100 mg total) by mouth 2 (two) times daily. 04/26/21   Almon Hercules, MD  ciprofloxacin (CIPRO) 250 MG tablet Take 250 mg by mouth 2 (two) times daily. For 5 days start date 04/25/22 Patient not taking: Reported on 05/01/2022 04/25/22   [provider]  famotidine (PEPCID) 20 MG tablet Take 20 mg by mouth daily.    [provider]  levothyroxine (SYNTHROID) 100 MCG tablet Take 100 mcg by mouth daily before breakfast.     [provider]  Multiple Vitamin (MULTIVITAMIN WITH MINERALS) TABS tablet Take 1 tablet by mouth daily. 04/26/21   Almon Hercules, MD  NAMENDA 10 MG tablet Take 10 mg by mouth 2 (two)  times daily.    [provider]  pravastatin (PRAVACHOL) 10 MG tablet Take 10 mg by mouth daily.    [provider]  Rivaroxaban (XARELTO) 15 MG TABS tablet Take 15 mg by mouth daily with supper.    [provider]      Allergies    Aleve [naproxen], Aspirin, and Penicillins    Review of Systems   Review of Systems  Physical Exam Updated Vital Signs BP (!) 161/90   Pulse 78   Temp 97.8 F (36.6 C) (Oral)   Resp 18   Ht 5\' 6"  (1.676 m)   Wt 75.3 kg   SpO2 99%   BMI 26.79 kg/m  Physical Exam Vitals and nursing note reviewed.  Constitutional:      General: She is not in acute distress.    Appearance: She is well-developed.  HENT:     Head: Normocephalic and atraumatic.     Comments: 2 cm laceration buccal mucosa upper lip Eyes:     Conjunctiva/sclera: Conjunctivae normal.  Cardiovascular:     Rate and Rhythm: Normal rate and regular rhythm.     Heart sounds: No murmur heard. Pulmonary:     Effort: Pulmonary effort is normal. No respiratory distress.     Breath sounds: Normal breath sounds.  Abdominal:  Palpations: Abdomen is soft.     Tenderness: There is no abdominal tenderness.  Musculoskeletal:        General: No swelling.     Cervical back: Neck supple.  Skin:    General: Skin is warm and dry.     Capillary Refill: Capillary refill takes less than 2 seconds.     Comments: 3 cm laceration to upper lip not through vermilion border  Neurological:     Mental Status: She is alert.  Psychiatric:        Mood and Affect: Mood normal.     ED Results / Procedures / Treatments   Labs (all labs ordered are listed, but only abnormal results are displayed) Labs Reviewed  CBC WITH DIFFERENTIAL/PLATELET - Abnormal; Notable for the following components:      Result Value   RBC 3.77 (*)    Hemoglobin 11.8 (*)    All other components within normal limits  BASIC METABOLIC PANEL - Abnormal; Notable for the following components:   Glucose,  Bld 234 (*)    Creatinine, Ser 1.08 (*)    Calcium 8.8 (*)    GFR, Estimated 48 (*)    All other components within normal limits  CBG MONITORING, ED - Abnormal; Notable for the following components:   Glucose-Capillary 247 (*)    All other components within normal limits    EKG EKG Interpretation  Date/Time:  Saturday May 19 2022 12:44:12 EST Ventricular Rate:  84 PR Interval:    QRS Duration: 107 QT Interval:  413 QTC Calculation: 489 R Axis:   15 Text Interpretation: Atrial fibrillation Probable anterior infarct, age indeterminate Confirmed by Eber Hong ([id number]) on 05/19/2022 1:36:51 PM  Radiology CT Head Wo Contrast  Result Date: 05/19/2022 CLINICAL DATA:  Head trauma, minor (Age >= 65y); Facial trauma, blunt; Neck trauma (Age >= 65y) EXAM: CT HEAD WITHOUT CONTRAST CT MAXILLOFACIAL WITHOUT CONTRAST CT CERVICAL SPINE WITHOUT CONTRAST TECHNIQUE: Multidetector CT imaging of the head, cervical spine, and maxillofacial structures were performed using the standard protocol without intravenous contrast. Multiplanar CT image reconstructions of the cervical spine and maxillofacial structures were also generated. RADIATION DOSE REDUCTION: This exam was performed according to the departmental dose-optimization program which includes automated exposure control, adjustment of the mA and/or kV according to patient size and/or use of iterative reconstruction technique. COMPARISON:  05/01/2022 FINDINGS: CT HEAD FINDINGS Brain: No evidence of acute infarction, hemorrhage, hydrocephalus, extra-axial collection or mass lesion/mass effect. Scattered low-density changes within the periventricular and subcortical white matter compatible with chronic microvascular ischemic change. Mild diffuse cerebral volume loss. Vascular: Atherosclerotic calcifications involving the large vessels of the skull base. No unexpected hyperdense vessel. Skull: Normal. Negative for fracture or focal lesion. Other: None. CT  MAXILLOFACIAL FINDINGS Osseous: No acute maxillofacial bone fracture. Bony orbital walls are intact. Mandible intact. Temporomandibular joints are aligned without dislocation. Severe degenerative changes of the left TMJ. Orbits: Negative. No traumatic or inflammatory finding. Sinuses: Clear. Soft tissues: Negative. CT CERVICAL SPINE FINDINGS Alignment: Facet joints are aligned without dislocation or traumatic listhesis. Dens and lateral masses are aligned. Skull base and vertebrae: No acute fracture. No primary bone lesion or focal pathologic process. Soft tissues and spinal canal: No prevertebral fluid or swelling. No visible canal hematoma. Disc levels: Multilevel facet-predominant cervical spondylosis, overall mild for age. Upper chest: Aortic atherosclerosis. Other: Bilateral carotid atherosclerosis. IMPRESSION: 1. No acute intracranial abnormality. 2. No acute maxillofacial bone fracture. 3. No acute fracture or subluxation of the cervical spine. Aortic  Atherosclerosis (ICD10-I70.0). Electronically Signed   By: Davina Poke D.O.   On: 05/19/2022 13:38   CT Maxillofacial Wo Contrast  Result Date: 05/19/2022 CLINICAL DATA:  Head trauma, minor (Age >= 65y); Facial trauma, blunt; Neck trauma (Age >= 65y) EXAM: CT HEAD WITHOUT CONTRAST CT MAXILLOFACIAL WITHOUT CONTRAST CT CERVICAL SPINE WITHOUT CONTRAST TECHNIQUE: Multidetector CT imaging of the head, cervical spine, and maxillofacial structures were performed using the standard protocol without intravenous contrast. Multiplanar CT image reconstructions of the cervical spine and maxillofacial structures were also generated. RADIATION DOSE REDUCTION: This exam was performed according to the departmental dose-optimization program which includes automated exposure control, adjustment of the mA and/or kV according to patient size and/or use of iterative reconstruction technique. COMPARISON:  05/01/2022 FINDINGS: CT HEAD FINDINGS Brain: No evidence of acute  infarction, hemorrhage, hydrocephalus, extra-axial collection or mass lesion/mass effect. Scattered low-density changes within the periventricular and subcortical white matter compatible with chronic microvascular ischemic change. Mild diffuse cerebral volume loss. Vascular: Atherosclerotic calcifications involving the large vessels of the skull base. No unexpected hyperdense vessel. Skull: Normal. Negative for fracture or focal lesion. Other: None. CT MAXILLOFACIAL FINDINGS Osseous: No acute maxillofacial bone fracture. Bony orbital walls are intact. Mandible intact. Temporomandibular joints are aligned without dislocation. Severe degenerative changes of the left TMJ. Orbits: Negative. No traumatic or inflammatory finding. Sinuses: Clear. Soft tissues: Negative. CT CERVICAL SPINE FINDINGS Alignment: Facet joints are aligned without dislocation or traumatic listhesis. Dens and lateral masses are aligned. Skull base and vertebrae: No acute fracture. No primary bone lesion or focal pathologic process. Soft tissues and spinal canal: No prevertebral fluid or swelling. No visible canal hematoma. Disc levels: Multilevel facet-predominant cervical spondylosis, overall mild for age. Upper chest: Aortic atherosclerosis. Other: Bilateral carotid atherosclerosis. IMPRESSION: 1. No acute intracranial abnormality. 2. No acute maxillofacial bone fracture. 3. No acute fracture or subluxation of the cervical spine. Aortic Atherosclerosis (ICD10-I70.0). Electronically Signed   By: Davina Poke D.O.   On: 05/19/2022 13:38   CT Cervical Spine Wo Contrast  Result Date: 05/19/2022 CLINICAL DATA:  Head trauma, minor (Age >= 65y); Facial trauma, blunt; Neck trauma (Age >= 65y) EXAM: CT HEAD WITHOUT CONTRAST CT MAXILLOFACIAL WITHOUT CONTRAST CT CERVICAL SPINE WITHOUT CONTRAST TECHNIQUE: Multidetector CT imaging of the head, cervical spine, and maxillofacial structures were performed using the standard protocol without intravenous  contrast. Multiplanar CT image reconstructions of the cervical spine and maxillofacial structures were also generated. RADIATION DOSE REDUCTION: This exam was performed according to the departmental dose-optimization program which includes automated exposure control, adjustment of the mA and/or kV according to patient size and/or use of iterative reconstruction technique. COMPARISON:  05/01/2022 FINDINGS: CT HEAD FINDINGS Brain: No evidence of acute infarction, hemorrhage, hydrocephalus, extra-axial collection or mass lesion/mass effect. Scattered low-density changes within the periventricular and subcortical white matter compatible with chronic microvascular ischemic change. Mild diffuse cerebral volume loss. Vascular: Atherosclerotic calcifications involving the large vessels of the skull base. No unexpected hyperdense vessel. Skull: Normal. Negative for fracture or focal lesion. Other: None. CT MAXILLOFACIAL FINDINGS Osseous: No acute maxillofacial bone fracture. Bony orbital walls are intact. Mandible intact. Temporomandibular joints are aligned without dislocation. Severe degenerative changes of the left TMJ. Orbits: Negative. No traumatic or inflammatory finding. Sinuses: Clear. Soft tissues: Negative. CT CERVICAL SPINE FINDINGS Alignment: Facet joints are aligned without dislocation or traumatic listhesis. Dens and lateral masses are aligned. Skull base and vertebrae: No acute fracture. No primary bone lesion or focal pathologic process. Soft tissues and spinal  canal: No prevertebral fluid or swelling. No visible canal hematoma. Disc levels: Multilevel facet-predominant cervical spondylosis, overall mild for age. Upper chest: Aortic atherosclerosis. Other: Bilateral carotid atherosclerosis. IMPRESSION: 1. No acute intracranial abnormality. 2. No acute maxillofacial bone fracture. 3. No acute fracture or subluxation of the cervical spine. Aortic Atherosclerosis (ICD10-I70.0). Electronically Signed   By:  Duanne Guess D.O.   On: 05/19/2022 13:38    Procedures .Marland KitchenLaceration Repair  Date/Time: 05/19/2022 2:59 PM  Performed by: Ma Rings, PA-C Authorized by: Ma Rings, PA-C   Consent:    Consent obtained:  Verbal   Consent given by:  Patient Universal protocol:    Patient identity confirmed:  Verbally with patient Anesthesia:    Anesthesia method:  Local infiltration   Local anesthetic:  Lidocaine 1% WITH epi Laceration details:    Location:  Lip   Lip location:  Upper exterior lip   Length (cm):  3 Exploration:    Hemostasis achieved with:  Direct pressure   Imaging obtained comment:  CT maxillofacial   Imaging outcome: foreign body not noted     Wound exploration: entire depth of wound visualized     Contaminated: no   Treatment:    Area cleansed with:  Povidone-iodine   Amount of cleaning:  Standard   Irrigation solution:  Sterile saline   Debridement:  None Skin repair:    Repair method:  Sutures   Suture size:  5-0   Suture material:  Prolene   Suture technique:  Simple interrupted   Number of sutures:  5 Approximation:    Approximation:  Close Repair type:    Repair type:  Simple Post-procedure details:    Dressing:  Antibiotic ointment .Marland KitchenLaceration Repair  Date/Time: 05/19/2022 3:01 PM  Performed by: Ma Rings, PA-C Authorized by: Ma Rings, PA-C   Consent:    Consent obtained:  Verbal   Consent given by:  Patient Universal protocol:    Patient identity confirmed:  Verbally with patient Anesthesia:    Anesthesia method:  Local infiltration   Local anesthetic:  Lidocaine 1% WITH epi Laceration details:    Location:  Lip   Lip location:  Upper interior lip   Length (cm):  2 Exploration:    Hemostasis achieved with:  Direct pressure   Imaging outcome: foreign body not noted     Contaminated: no   Treatment:    Amount of cleaning:  Standard   Irrigation solution:  Sterile saline   Debridement:  None Skin repair:     Repair method:  Sutures   Suture size:  6-0   Suture material:  Chromic gut   Number of sutures:  2 Approximation:    Approximation:  Loose Repair type:    Repair type:  Simple Post-procedure details:    Dressing:  Open (no dressing)     Medications Ordered in ED Medications  lidocaine-EPINEPHrine (PF) (XYLOCAINE-EPINEPHrine) 1 %-1:200000 (PF) injection 10 mL (has no administration in time range)  bacitracin ointment (1 Application Topical Given 05/19/22 1510)    ED Course/ Medical Decision Making/ A&P                             Medical Decision Making This patient presents to the ED for concern of fall with lip laceration, this involves an extensive number of treatment options, and is a complaint that carries with it a high risk of complications and morbidity.  The differential diagnosis  includes cranial hemorrhage, laceration, maxillofacial fracture, Co morbidities that complicate the patient evaluation   Atrial fibrillation on anticoagulation   Additional history obtained:  Additional history obtained from EMR External records from outside source obtained and reviewed including prior ED and outpatitient visits   Lab Tests:  I Ordered, and personally interpreted labs.  The pertinent results include: CBC and BMP at baseline.  Patient is mildly hyperglycemic   Imaging Studies ordered:  I ordered imaging studies including head C-spine and maxillofacial I independently visualized and interpreted imaging which showed fractures, no intracranial hemorrhage, no acute findings I agree with the radiologist interpretation     Problem List / ED Course / Critical interventions / Medication management  Fall resulting in and upper lip laceration repaired as noted in the procedure dissection.  Patient had normal CT scans.  Basic labs ordered as it was not completely clear as this was a purely mechanical fall these are reassuring.  ECG shows no acute changes.  Patient had a wound  through and through the upper lip which will be given clindamycin for infection prophylaxis as patient has anaphylaxis to penicillins.  The patient is awake alert and oriented with no neurologic deficits.  I discussed care and follow-up with patient and her son at bedside. I ordered medication including lidocaine for local anesthesia Reevaluation of the patient after these medicines showed that the patient improved I have reviewed the patients home medicines and have made adjustments as needed        Amount and/or Complexity of Data Reviewed Labs: ordered. Radiology: ordered.           Final Clinical Impression(s) / ED Diagnoses Final diagnoses:  Fall, initial encounter  Facial laceration, initial encounter    Rx / DC Orders ED Discharge Orders          Ordered    clindamycin (CLEOCIN) 300 MG capsule  3 times daily        05/19/22 [id number] PA-C 05/19/22 1534    Noemi Chapel, MD 05/20/22 [phone]

## 2022-05-19 NOTE — ED Triage Notes (Signed)
Pt to ED c/o fall pt on blood thinner, xerelto, Pt had unwitnessed fall aprox 1 hour ago, pt denies LOC. Unknown if pt got dizzy and fell or mechanical fall. Pt did hit mouth, laceration noted to upper lip, false teeth broken. Pt voicing no other complaints.

## 2022-06-20 ENCOUNTER — Ambulatory Visit: Payer: PRIVATE HEALTH INSURANCE | Admitting: Cardiology

## 2022-06-28 ENCOUNTER — Ambulatory Visit: Payer: PRIVATE HEALTH INSURANCE | Admitting: Cardiology

## 2022-07-10 ENCOUNTER — Encounter: Payer: Self-pay | Admitting: Cardiology

## 2022-07-10 ENCOUNTER — Ambulatory Visit: Payer: Medicare Other | Admitting: Cardiology

## 2022-07-10 VITALS — BP 134/80 | HR 78 | Resp 16 | Ht 66.0 in | Wt 166.0 lb

## 2022-07-10 DIAGNOSIS — I441 Atrioventricular block, second degree: Secondary | ICD-10-CM

## 2022-07-10 DIAGNOSIS — Z950 Presence of cardiac pacemaker: Secondary | ICD-10-CM

## 2022-07-10 DIAGNOSIS — N1832 Chronic kidney disease, stage 3b: Secondary | ICD-10-CM

## 2022-07-10 DIAGNOSIS — I4821 Permanent atrial fibrillation: Secondary | ICD-10-CM

## 2022-07-10 NOTE — Progress Notes (Signed)
Primary Physician/Referring:  Haywood Pao, MD  Patient ID: Kerry Carlson, female    DOB: May 25, 1925, 89 y.o.   MRN: [id number]  Chief Complaint  Patient presents with   Atrial Fibrillation   Follow-up    1 years   HPI:    Kerry Carlson  is a 89 y.o. permanent atrial fibrillation, hypertension, diabetes with diabetic polyneuropathy, diabetic nephropathy stage IIIa-b, now diet controlled diabetes, hyperlipidemia and Mobitz 2 AV block and permanent atrial fibrillation SP MRI compatible Medtronic single-chamber pacemaker implanted 07/20/2016 while in [place]. Patient was admitted to the hospital after a fall on 04/21/2021 and underwent right hip hemiarthroplasty, since then she has had 3 more falls but fortunately no injury reported. She is accompanied by her daughter.  She has not had any syncope, denies chest pain or dyspnea.  Past Medical History:  Diagnosis Date   Atrial fibrillation ([place])    Diabetes mellitus without complication ([place])    Type 2   Hyperlipidemia    Hypertension    Pacemaker - Medtronic Azure single chamber pacemaker 07/12/2016. 05/23/2020   Second degree Mobitz II AV block 05/23/2020   Thyroid disease    Hypothyroid   Social History   Tobacco Use   Smoking status: Never   Smokeless tobacco: Never  Substance Use Topics   Alcohol use: No   Marital Status: Widowed  ROS  Review of Systems  Cardiovascular:  Negative for chest pain, dyspnea on exertion and leg swelling.   Objective  Blood pressure 134/80, pulse 78, resp. rate 16, height 5\' 6"  (1.676 m), weight 166 lb (75.3 kg), SpO2 95 %.     07/10/2022    2:21 PM 05/19/2022    2:30 PM 05/19/2022    2:00 PM  Vitals with BMI  Height 5\' 6"     Weight 166 lbs    BMI [id number]    Systolic [id number] Q000111Q Q000111Q  Diastolic 80 90 81  Pulse 78 78 67     Physical Exam Cardiovascular:     Rate and Rhythm: Normal rate. Rhythm irregular.     Pulses: Intact distal pulses.     Heart sounds: Normal heart sounds. No  murmur heard.    No gallop.  Pulmonary:     Effort: Pulmonary effort is normal.     Breath sounds: Normal breath sounds.  Abdominal:     General: Bowel sounds are normal.     Palpations: Abdomen is soft.  Musculoskeletal:     Right lower leg: Edema (2+ ankle edema) present.     Left lower leg: Edema (2+ ankle edema) present.    Laboratory examination:   Lab Results  Component Value Date   NA 136 05/19/2022   K 4.6 05/19/2022   CO2 27 05/19/2022   GLUCOSE 234 (H) 05/19/2022   BUN 15 05/19/2022   CREATININE 1.08 (H) 05/19/2022   CALCIUM 8.8 (L) 05/19/2022   GFRNONAA 48 (L) 05/19/2022       Latest Ref Rng & Units 05/19/2022    1:35 PM 05/01/2022    2:14 PM 04/26/2021    2:43 AM  CMP  Glucose 70 - 99 mg/dL 234  138  131   BUN 8 - 23 mg/dL 15  16  14    Creatinine 0.44 - 1.00 mg/dL 1.08  1.16  0.99   Sodium 135 - 145 mmol/L 136  136  135   Potassium 3.5 - 5.1 mmol/L 4.6  3.9  4.3   Chloride 98 - 111  mmol/L 100  101  105   CO2 22 - 32 mmol/L 27  27  23    Calcium 8.9 - 10.3 mg/dL 8.8  9.0  8.5   Total Protein 6.5 - 8.1 g/dL  6.7    Total Bilirubin 0.3 - 1.2 mg/dL  1.0    Alkaline Phos 38 - 126 U/L  43    AST 15 - 41 U/L  17    ALT 0 - 44 U/L  12        Latest Ref Rng & Units 05/19/2022    1:35 PM 05/01/2022    2:14 PM 04/26/2021    2:43 AM  CBC  WBC 4.0 - 10.5 K/uL 6.7  7.1  10.0   Hemoglobin 12.0 - 15.0 g/dL 11.8  12.8  10.8   Hematocrit 36.0 - 46.0 % 36.2  39.2  33.8   Platelets 150 - 400 K/uL 201  227  199     External labs:   Labs 04/25/2022:  Hb 13.7/HCT 40.6, platelets 241, normal indicis.  Serum glucose: 40 mg, BUN 14, creatinine 1.2, EGFR 41 mL, potassium 3.8.  LFTs normal.  Labs 01/05/2022:  A1c 6.4%.  TSH mildly elevated at 7.56.  Cholesterol, total 129.000 m 10/08/2019 HDL 44 MG/DL 10/08/2019 LDL 68.000 mg 10/08/2019 Triglycerides 86.000 10/08/2019    Medications and allergies   Allergies  Allergen Reactions   Aleve [Naproxen] Hives   Aspirin Nausea  And Vomiting   Penicillins Hives    Had a really long time ago, caused hives Has patient had a PCN reaction causing immediate rash, facial/tongue/throat swelling, SOB or lightheadedness with hypotension: unknown Has patient had a PCN reaction causing severe rash involving mucus membranes or skin necrosis: unknown Has patient had a PCN reaction that required hospitalization unknown Has patient had a PCN reaction occurring within the last 10 years:no If all of the above answers are "NO", then may proceed with Cephalosporin use.      Current Outpatient Medications:    acetaminophen (TYLENOL) 500 MG tablet, Take 1,000 mg by mouth 2 (two) times daily., Disp: , Rfl:    atenolol (TENORMIN) 100 MG tablet, Take 1 tablet (100 mg total) by mouth 2 (two) times daily., Disp: , Rfl:    famotidine (PEPCID) 20 MG tablet, Take 20 mg by mouth daily., Disp: , Rfl:    levothyroxine (SYNTHROID) 100 MCG tablet, Take 100 mcg by mouth daily before breakfast. , Disp: , Rfl:    Multiple Vitamin (MULTIVITAMIN WITH MINERALS) TABS tablet, Take 1 tablet by mouth daily., Disp: , Rfl:    NAMENDA 10 MG tablet, Take 10 mg by mouth 2 (two) times daily., Disp: , Rfl:    pravastatin (PRAVACHOL) 10 MG tablet, Take 10 mg by mouth daily., Disp: , Rfl:    Rivaroxaban (XARELTO) 15 MG TABS tablet, Take 15 mg by mouth daily with supper., Disp: , Rfl:    Device Clinic: Medtronic Azure MRI single chamber pacemaker 07/12/2016   Scheduled  In office pacemaker check 06/19/21  Single (S)/Dual (D)/BV: S.  Presenting VVIR - 60  AF, VS @ 68/min. Pacemaker dependant:  No. Underlying VS 77/min. AP NA, VP 29%.. AMS Episodes 0.. HVR 0. Longevity 10 Years. Magnet rate: >85%. Lead measurements: Stable.Marland Kitchen Histogram: Low (L)/normal (N)/high (H)  Normal. Patient activity Low <1 hour/day.    Cardiac Studies:   Echocardiogram done in [place] 6 months ago, report not available.  EKG  EKG 07/10/2022: Atrial fibrillation with controlled  ventricular response, demand ventricularly  paced rhythm.  Compared to 11/06/2019, atrial fibrillation with ventricular paced rhythm   Assessment     ICD-10-CM   1. Permanent atrial fibrillation (HCC)  I48.21 EKG 12-Lead    2. Second degree Mobitz II AV block  I44.1     3. Pacemaker - Medtronic Azure single chamber pacemaker 07/12/2016.  Z95.0     4. Stage 3b chronic kidney disease (HCC)  N18.32       Medications Discontinued During This Encounter  Medication Reason   ciprofloxacin (CIPRO) 250 MG tablet Patient Preference    CHA2DS2-VASc Score is 5.  Yearly risk of stroke: 7.2% (F, A, HTN, DM).  Score of 1=0.6; 2=2.2; 3=3.2; 4=4.8; 5=7.2; 6=9.8; 7=>9.8) -(CHF; HTN; vasc disease DM,  Female = 1; Age <65 =0; 65-74 = 1,  >75 =2; stroke/embolism= 2).    Recommendations:   Kerry Carlson  is a 89 y.o. permanent atrial fibrillation, hypertension, diabetes with diabetic polyneuropathy, diabetic nephropathy stage IIIa-b, now diet controlled diabetes, hyperlipidemia and Mobitz 2 AV block and permanent atrial fibrillation SP MRI compatible Medtronic single-chamber pacemaker implanted 07/20/2016 while in [place]. Patient was admitted to the hospital after a fall on 04/21/2021 and underwent right hip hemiarthroplasty, since then she has had 3 more falls but fortunately no injury reported. She is accompanied by her daughter.  1. Permanent atrial fibrillation [facility]) Patient is in permanent atrial fibrillation.  She is presently on rivaroxaban 15 mg daily which is appropriate dose in view of renal insufficiency and age.  2. Second degree Mobitz II AV block Patient is not pacemaker dependent, her last pacemaker check was greater than a year ago.  It appears that she does not have a transmitter as she has moved about 3 times since moving to [place] from [place].  I will try to set her up for a in-house pacemaker check and also will try to set up remote monitoring.  3. Pacemaker - Medtronic Azure single  chamber pacemaker 07/12/2016. Fortunately she is not pacemaker dependent, she still has 10 years of battery life left as of last year's evaluation.  4. Stage 3b chronic kidney disease (Kerry Carlson) Patient has stage IIIb kidney disease has remained stable, external labs reviewed.  Blood pressure is well-controlled.  I did not make any changes to her medication.  I like to see her back on annual basis however I will set up for a pacemaker check in the next few weeks.    Adrian Prows, MD, [facility] 07/10/2022, 2:47 PM Office: [phone]

## 2022-08-14 ENCOUNTER — Ambulatory Visit: Payer: Medicare Other | Admitting: Cardiology

## 2022-08-14 DIAGNOSIS — Z950 Presence of cardiac pacemaker: Secondary | ICD-10-CM

## 2022-08-14 DIAGNOSIS — Z45018 Encounter for adjustment and management of other part of cardiac pacemaker: Secondary | ICD-10-CM

## 2022-08-14 DIAGNOSIS — I441 Atrioventricular block, second degree: Secondary | ICD-10-CM

## 2022-08-14 NOTE — Progress Notes (Signed)
Chief Complaint  Patient presents with   Pacemaker Check   Scheduled  In office pacemaker check 08/14/2022  Single (S)/Dual (D)/BV: S.  Presenting VVIR - 60  AF, VS @ 68/min. Pacemaker dependant:  No. Underlying VS 77/min. AP NA, VP 33%.. AMS Episodes 0.. HVR 1 nsvt 15 beats on 07/21/2022. Longevity 9.4 Years. Magnet rate: >85%. Lead measurements: Stable.Marland Kitchen Histogram: Low (L)/normal (N)/high (H)  Normal. Patient activity Low <1 hour/day.    Yates Decamp, MD, [facility] 08/14/2022, 1:40 PM Office: [phone] Fax: [phone] Pager: [phone]

## 2022-08-15 ENCOUNTER — Encounter: Payer: PRIVATE HEALTH INSURANCE | Admitting: Cardiology

## 2023-02-18 ENCOUNTER — Other Ambulatory Visit: Payer: Self-pay

## 2023-02-18 ENCOUNTER — Emergency Department (HOSPITAL_COMMUNITY): Payer: Medicare Other

## 2023-02-18 ENCOUNTER — Observation Stay (HOSPITAL_COMMUNITY)
Admission: EM | Admit: 2023-02-18 | Discharge: 2023-02-21 | Disposition: A | Discharge status: Home Health | Payer: Medicare Other | Attending: Internal Medicine | Admitting: Internal Medicine

## 2023-02-18 ENCOUNTER — Encounter (HOSPITAL_COMMUNITY): Payer: Self-pay

## 2023-02-18 DIAGNOSIS — Z96641 Presence of right artificial hip joint: Secondary | ICD-10-CM | POA: Diagnosis not present

## 2023-02-18 DIAGNOSIS — Z79899 Other long term (current) drug therapy: Secondary | ICD-10-CM | POA: Diagnosis not present

## 2023-02-18 DIAGNOSIS — Z950 Presence of cardiac pacemaker: Secondary | ICD-10-CM | POA: Insufficient documentation

## 2023-02-18 DIAGNOSIS — Z7901 Long term (current) use of anticoagulants: Secondary | ICD-10-CM | POA: Diagnosis not present

## 2023-02-18 DIAGNOSIS — E039 Hypothyroidism, unspecified: Secondary | ICD-10-CM | POA: Insufficient documentation

## 2023-02-18 DIAGNOSIS — Z23 Encounter for immunization: Secondary | ICD-10-CM | POA: Insufficient documentation

## 2023-02-18 DIAGNOSIS — E079 Disorder of thyroid, unspecified: Secondary | ICD-10-CM | POA: Diagnosis present

## 2023-02-18 DIAGNOSIS — G9340 Encephalopathy, unspecified: Principal | ICD-10-CM | POA: Insufficient documentation

## 2023-02-18 DIAGNOSIS — I10 Essential (primary) hypertension: Secondary | ICD-10-CM | POA: Diagnosis present

## 2023-02-18 DIAGNOSIS — E785 Hyperlipidemia, unspecified: Secondary | ICD-10-CM | POA: Diagnosis present

## 2023-02-18 DIAGNOSIS — E119 Type 2 diabetes mellitus without complications: Secondary | ICD-10-CM | POA: Insufficient documentation

## 2023-02-18 DIAGNOSIS — Z66 Do not resuscitate: Secondary | ICD-10-CM | POA: Diagnosis present

## 2023-02-18 DIAGNOSIS — I480 Paroxysmal atrial fibrillation: Secondary | ICD-10-CM | POA: Diagnosis not present

## 2023-02-18 DIAGNOSIS — F0390 Unspecified dementia without behavioral disturbance: Secondary | ICD-10-CM | POA: Diagnosis not present

## 2023-02-18 DIAGNOSIS — Z1152 Encounter for screening for COVID-19: Secondary | ICD-10-CM | POA: Diagnosis not present

## 2023-02-18 DIAGNOSIS — K219 Gastro-esophageal reflux disease without esophagitis: Secondary | ICD-10-CM | POA: Insufficient documentation

## 2023-02-18 DIAGNOSIS — R4182 Altered mental status, unspecified: Secondary | ICD-10-CM | POA: Diagnosis present

## 2023-02-18 LAB — COMPREHENSIVE METABOLIC PANEL
ALT: 14 U/L (ref 0–44)
AST: 18 U/L (ref 15–41)
Albumin: 4.1 g/dL (ref 3.5–5.0)
Alkaline Phosphatase: 47 U/L (ref 38–126)
Anion gap: 8 (ref 5–15)
BUN: 14 mg/dL (ref 8–23)
CO2: 26 mmol/L (ref 22–32)
Calcium: 9.1 mg/dL (ref 8.9–10.3)
Chloride: 99 mmol/L (ref 98–111)
Creatinine, Ser: 1.28 mg/dL — ABNORMAL HIGH (ref 0.44–1.00)
GFR, Estimated: 39 mL/min — ABNORMAL LOW (ref >60)
Glucose, Bld: 113 mg/dL — ABNORMAL HIGH (ref 70–99)
Potassium: 4.5 mmol/L (ref 3.5–5.1)
Sodium: 133 mmol/L — ABNORMAL LOW (ref 135–145)
Total Bilirubin: 0.7 mg/dL (ref 0.3–1.2)
Total Protein: 7 g/dL (ref 6.5–8.1)

## 2023-02-18 LAB — URINALYSIS, W/ REFLEX TO CULTURE (INFECTION SUSPECTED)
Bacteria, UA: NONE SEEN
Bilirubin Urine: NEGATIVE
Glucose, UA: NEGATIVE mg/dL
Hgb urine dipstick: NEGATIVE
Ketones, ur: 5 mg/dL — AB
Leukocytes,Ua: NEGATIVE
Nitrite: NEGATIVE
Protein, ur: NEGATIVE mg/dL
Specific Gravity, Urine: 1.009 (ref 1.005–1.030)
pH: 6 (ref 5.0–8.0)

## 2023-02-18 LAB — CBC WITH DIFFERENTIAL/PLATELET
Abs Immature Granulocytes: 0.02 10*3/uL (ref 0.00–0.07)
Basophils Absolute: 0 10*3/uL (ref 0.0–0.1)
Basophils Relative: 0 %
Eosinophils Absolute: 0.1 10*3/uL (ref 0.0–0.5)
Eosinophils Relative: 1 %
HCT: 40.3 % (ref 36.0–46.0)
Hemoglobin: 12.8 g/dL (ref 12.0–15.0)
Immature Granulocytes: 0 %
Lymphocytes Relative: 32 %
Lymphs Abs: 2.5 10*3/uL (ref 0.7–4.0)
MCH: 30.5 pg (ref 26.0–34.0)
MCHC: 31.8 g/dL (ref 30.0–36.0)
MCV: 96 fL (ref 80.0–100.0)
Monocytes Absolute: 0.5 10*3/uL (ref 0.1–1.0)
Monocytes Relative: 7 %
Neutro Abs: 4.7 10*3/uL (ref 1.7–7.7)
Neutrophils Relative %: 60 %
Platelets: 236 10*3/uL (ref 150–400)
RBC: 4.2 MIL/uL (ref 3.87–5.11)
RDW: 13.1 % (ref 11.5–15.5)
WBC: 7.9 10*3/uL (ref 4.0–10.5)
nRBC: 0 % (ref 0.0–0.2)

## 2023-02-18 LAB — TSH: TSH: 2.255 u[IU]/mL (ref 0.350–4.500)

## 2023-02-18 LAB — LACTIC ACID, PLASMA
Lactic Acid, Venous: 0.9 mmol/L (ref 0.5–1.9)
Lactic Acid, Venous: 0.9 mmol/L (ref 0.5–1.9)

## 2023-02-18 LAB — RESP PANEL BY RT-PCR (RSV, FLU A&B, COVID)  RVPGX2
Influenza A by PCR: NEGATIVE
Influenza B by PCR: NEGATIVE
Resp Syncytial Virus by PCR: NEGATIVE
SARS Coronavirus 2 by RT PCR: NEGATIVE

## 2023-02-18 LAB — PROTIME-INR
INR: 1.2 (ref 0.8–1.2)
Prothrombin Time: 15.7 s — ABNORMAL HIGH (ref 11.4–15.2)

## 2023-02-18 LAB — AMMONIA: Ammonia: <10 umol/L (ref 9–35)

## 2023-02-18 LAB — APTT: aPTT: 41 s — ABNORMAL HIGH (ref 24–36)

## 2023-02-18 MED ORDER — ACETAMINOPHEN 650 MG RE SUPP
650.0000 mg | Freq: Four times a day (QID) | RECTAL | Status: DC | PRN
Start: 2023-02-18 — End: 2023-02-21

## 2023-02-18 MED ORDER — ONDANSETRON HCL 4 MG PO TABS: 4.0000 mg | ORAL_TABLET | Freq: Four times a day (QID) | ORAL | Status: DC | PRN

## 2023-02-18 MED ORDER — MEMANTINE HCL 10 MG PO TABS
10.0000 mg | ORAL_TABLET | Freq: Two times a day (BID) | ORAL | Status: DC
  Administered 2023-02-19 – 2023-02-21 (×6): 10 mg via ORAL
  Filled 2023-02-18 (×6): qty 1

## 2023-02-18 MED ORDER — ACETAMINOPHEN 325 MG PO TABS: 650.0000 mg | ORAL_TABLET | Freq: Four times a day (QID) | ORAL | Status: DC | PRN

## 2023-02-18 MED ORDER — LEVOTHYROXINE SODIUM 50 MCG PO TABS: 100.0000 ug | ORAL_TABLET | Freq: Every day | ORAL | Status: DC

## 2023-02-18 MED ORDER — FAMOTIDINE 20 MG PO TABS
20.0000 mg | ORAL_TABLET | Freq: Every day | ORAL | Status: DC
  Administered 2023-02-19 – 2023-02-21 (×3): 20 mg via ORAL
  Filled 2023-02-18 (×3): qty 1

## 2023-02-18 MED ORDER — PRAVASTATIN SODIUM 10 MG PO TABS
10.0000 mg | ORAL_TABLET | Freq: Every day | ORAL | Status: DC
  Administered 2023-02-19 – 2023-02-21 (×3): 10 mg via ORAL
  Filled 2023-02-18 (×3): qty 1

## 2023-02-18 MED ORDER — ATENOLOL 25 MG PO TABS
100.0000 mg | ORAL_TABLET | Freq: Two times a day (BID) | ORAL | Status: DC
  Administered 2023-02-19 – 2023-02-21 (×6): 100 mg via ORAL
  Filled 2023-02-18 (×7): qty 4

## 2023-02-18 MED ORDER — LEVOTHYROXINE SODIUM 100 MCG PO TABS
100.0000 ug | ORAL_TABLET | Freq: Every day | ORAL | Status: DC
  Administered 2023-02-19 – 2023-02-21 (×3): 100 ug via ORAL
  Filled 2023-02-18 (×3): qty 1

## 2023-02-18 MED ORDER — SODIUM CHLORIDE 0.9 % IV BOLUS
1000.0000 mL | Freq: Once | INTRAVENOUS | Status: AC
  Administered 2023-02-18: 1000 mL via INTRAVENOUS

## 2023-02-18 MED ORDER — OXYCODONE HCL 5 MG PO TABS: 5.0000 mg | ORAL_TABLET | ORAL | Status: DC | PRN

## 2023-02-18 MED ORDER — ONDANSETRON HCL 4 MG/2ML IJ SOLN: 4.0000 mg | Freq: Four times a day (QID) | INTRAMUSCULAR | Status: DC | PRN

## 2023-02-18 MED ORDER — RIVAROXABAN 15 MG PO TABS
15.0000 mg | ORAL_TABLET | Freq: Every day | ORAL | Status: DC
  Administered 2023-02-19: 15 mg via ORAL
  Filled 2023-02-18: qty 1

## 2023-02-18 NOTE — ED Notes (Signed)
ED TO INPATIENT HANDOFF REPORT  ED Nurse Name and Phone #: Leanord Hawking, RN [phone]  S Name/Age/Gender Kerry Carlson 89 y.o. female Room/Bed: APAH2/APAH2  Code Status   Code Status: Limited: Do not attempt resuscitation (DNR) -DNR-LIMITED -Do Not Intubate/DNI   Home/SNF/Other Home Patient oriented to: self, place, and situation Is this baseline? Yes   Triage Complete: Triage complete  Chief Complaint Altered mental status [R41.82]  Triage Note Pt arrived [facility] with c/o AMS and being more lethargic then normal. Pt has a UTI and been taking Bactrim DS x 5 days.    Allergies Allergies  Allergen Reactions   Aspirin Nausea And Vomiting   Memantine     Other Reaction(s): was more sedated   Nitrofurantoin Other (See Comments)   Sulfamethoxazole-Trimethoprim     Other Reaction(s): increased confusion   Naproxen Hives and Rash   Penicillins Hives, Rash and Other (See Comments)    Had a really long time ago, caused hives    Level of Care/Admitting Diagnosis ED Disposition     ED Disposition  Admit   Condition  --   Comment  Hospital Area: [facility] [[id number]]  Level of Care: Med-Surg [[id number]]  Covid Evaluation: Asymptomatic - no recent exposure (last 10 days) testing not required  Diagnosis: Altered mental status [780.97.ICD-9-CM]  Admitting Physician: Lilyan Gilford [[id number]]  Attending Physician: Lilyan Gilford [[id number]]          B Medical/Surgery History Past Medical History:  Diagnosis Date   Atrial fibrillation (HCC)    Diabetes mellitus without complication (HCC)    Type 2   Hyperlipidemia    Hypertension    Pacemaker - Medtronic Azure single chamber pacemaker 07/12/2016. 05/23/2020   Second degree Mobitz II AV block 05/23/2020   Thyroid disease    Hypothyroid   Past Surgical History:  Procedure Laterality Date   ABDOMINAL HYSTERECTOMY     PACEMAKER INSERTION  10/11/2016   Medtronic Azure MRI single-chamber pacemaker in [facility]   TOTAL  HIP ARTHROPLASTY Right 04/24/2021   Procedure: PARTIAL vs. TOTAL HIP ARTHROPLASTY ANTERIOR APPROACH;  Surgeon: Kathryne Hitch, MD;  Location: [facility] OR;  Service: Orthopedics;  Laterality: Right;     A IV Location/Drains/Wounds Patient Lines/Drains/Airways Status     Active Line/Drains/Airways     Name Placement date Placement time Site Days   Peripheral IV 02/18/23 22 G Posterior;Right Hand 02/18/23  1652  Hand  less than 1            Intake/Output Last 24 hours  Intake/Output Summary (Last 24 hours) at 02/18/2023 2333 Last data filed at 02/18/2023 2224 Gross per 24 hour  Intake --  Output 400 ml  Net -400 ml    Labs/Imaging Results for orders placed or performed during the hospital encounter of 02/18/23 (from the past 48 hour(s))  Lactic acid, plasma     Status: None   Collection Time: 02/18/23  5:28 PM  Result Value Ref Range   Lactic Acid, Venous 0.9 0.5 - 1.9 mmol/L    Comment: Performed at [facility], [address], [place], [place] [address]  Comprehensive metabolic panel     Status: Abnormal   Collection Time: 02/18/23  5:28 PM  Result Value Ref Range   Sodium 133 (L) 135 - 145 mmol/L   Potassium 4.5 3.5 - 5.1 mmol/L   Chloride 99 98 - 111 mmol/L   CO2 26 22 - 32 mmol/L   Glucose, Bld 113 (H) 70 - 99 mg/dL  Comment: Glucose reference range applies only to samples taken after fasting for at least 8 hours.   BUN 14 8 - 23 mg/dL   Creatinine, Ser 6.04 (H) 0.44 - 1.00 mg/dL   Calcium 9.1 8.9 - 54.0 mg/dL   Total Protein 7.0 6.5 - 8.1 g/dL   Albumin 4.1 3.5 - 5.0 g/dL   AST 18 15 - 41 U/L   ALT 14 0 - 44 U/L   Alkaline Phosphatase 47 38 - 126 U/L   Total Bilirubin 0.7 0.3 - 1.2 mg/dL   GFR, Estimated 39 (L) >60 mL/min    Comment: (NOTE) Calculated using the CKD-EPI Creatinine Equation (2021)    Anion gap 8 5 - 15    Comment: Performed at [facility], [address], [place], [place] [address]  CBC with Differential     Status: None   Collection  Time: 02/18/23  5:28 PM  Result Value Ref Range   WBC 7.9 4.0 - 10.5 K/uL   RBC 4.20 3.87 - 5.11 MIL/uL   Hemoglobin 12.8 12.0 - 15.0 g/dL   HCT 14.7 82.9 - 56.2 %   MCV 96.0 80.0 - 100.0 fL   MCH 30.5 26.0 - 34.0 pg   MCHC 31.8 30.0 - 36.0 g/dL   RDW 13.0 86.5 - 78.4 %   Platelets 236 150 - 400 K/uL   nRBC 0.0 0.0 - 0.2 %   Neutrophils Relative % 60 %   Neutro Abs 4.7 1.7 - 7.7 K/uL   Lymphocytes Relative 32 %   Lymphs Abs 2.5 0.7 - 4.0 K/uL   Monocytes Relative 7 %   Monocytes Absolute 0.5 0.1 - 1.0 K/uL   Eosinophils Relative 1 %   Eosinophils Absolute 0.1 0.0 - 0.5 K/uL   Basophils Relative 0 %   Basophils Absolute 0.0 0.0 - 0.1 K/uL   Immature Granulocytes 0 %   Abs Immature Granulocytes 0.02 0.00 - 0.07 K/uL    Comment: Performed at [facility], [address] [place], [place] [address]  Protime-INR     Status: Abnormal   Collection Time: 02/18/23  5:28 PM  Result Value Ref Range   Prothrombin Time 15.7 (H) 11.4 - 15.2 seconds   INR 1.2 0.8 - 1.2    Comment: (NOTE) INR goal varies based on device and disease states. Performed at [facility], [address], [place], [place] [address]   APTT     Status: Abnormal   Collection Time: 02/18/23  5:28 PM  Result Value Ref Range   aPTT 41 (H) 24 - 36 seconds    Comment:        IF BASELINE aPTT IS ELEVATED, SUGGEST PATIENT RISK ASSESSMENT BE USED TO DETERMINE APPROPRIATE ANTICOAGULANT THERAPY. Performed at [facility], [address], [place], [place] [address]   Blood Culture (routine x 2)     Status: None (Preliminary result)   Collection Time: 02/18/23  5:28 PM   Specimen: Blood  Result Value Ref Range   Specimen Description BLOOD BLOOD LEFT ARM    Special Requests      BOTTLES DRAWN AEROBIC AND ANAEROBIC Blood Culture results may not be optimal due to an excessive volume of blood received in culture bottles Performed at [facility], [address], [place], [place] [address]    Culture PENDING    Report Status  PENDING   Ammonia     Status: None   Collection Time: 02/18/23  5:28 PM  Result Value Ref Range  Ammonia <10 9 - 35 umol/L    Comment: Performed at [facility], [address], [place], [place] [address]  TSH     Status: None   Collection Time: 02/18/23  5:28 PM  Result Value Ref Range   TSH 2.255 0.350 - 4.500 uIU/mL    Comment: Performed by a 3rd Generation assay with a functional sensitivity of <=0.01 uIU/mL. Performed at [facility], [address], [place], [place] [address]   Blood Culture (routine x 2)     Status: None (Preliminary result)   Collection Time: 02/18/23  5:36 PM   Specimen: Blood  Result Value Ref Range   Specimen Description BLOOD BLOOD LEFT HAND    Special Requests      BOTTLES DRAWN AEROBIC ONLY Blood Culture adequate volume Performed at [facility], [address] [place], [place] [address]    Culture PENDING    Report Status PENDING   Resp panel by RT-PCR (RSV, Flu A&B, Covid) Anterior Nasal Swab     Status: None   Collection Time: 02/18/23  5:45 PM   Specimen: Anterior Nasal Swab  Result Value Ref Range   SARS Coronavirus 2 by RT PCR NEGATIVE NEGATIVE    Comment: (NOTE) SARS-CoV-2 target nucleic acids are NOT DETECTED.  The SARS-CoV-2 RNA is generally detectable in upper respiratory specimens during the acute phase of infection. The lowest concentration of SARS-CoV-2 viral copies this assay can detect is 138 copies/mL. A negative result does not preclude SARS-Cov-2 infection and should not be used as the sole basis for treatment or other patient management decisions. A negative result may occur with  improper specimen collection/handling, submission of specimen other than nasopharyngeal swab, presence of viral mutation(s) within the areas targeted by this assay, and inadequate number of viral copies(<138 copies/mL). A negative result must be combined with clinical observations, patient history, and epidemiological information. The expected  result is Negative.  Fact Sheet for Patients:  BloggerCourse.com  Fact Sheet for Healthcare Providers:  SeriousBroker.it  This test is no t yet approved or cleared by the [place] FDA and  has been authorized for detection and/or diagnosis of SARS-CoV-2 by FDA under an Emergency Use Authorization (EUA). This EUA will remain  in effect (meaning this test can be used) for the duration of the COVID-19 declaration under Section 564(b)(1) of the Act, 21 U.S.C.section 360bbb-3(b)(1), unless the authorization is terminated  or revoked sooner.       Influenza A by PCR NEGATIVE NEGATIVE   Influenza B by PCR NEGATIVE NEGATIVE    Comment: (NOTE) The Xpert Xpress SARS-CoV-2/FLU/RSV plus assay is intended as an aid in the diagnosis of influenza from Nasopharyngeal swab specimens and should not be used as a sole basis for treatment. Nasal washings and aspirates are unacceptable for Xpert Xpress SARS-CoV-2/FLU/RSV testing.  Fact Sheet for Patients: BloggerCourse.com  Fact Sheet for Healthcare Providers: SeriousBroker.it  This test is not yet approved or cleared by the [place] FDA and has been authorized for detection and/or diagnosis of SARS-CoV-2 by FDA under an Emergency Use Authorization (EUA). This EUA will remain in effect (meaning this test can be used) for the duration of the COVID-19 declaration under Section 564(b)(1) of the Act, 21 U.S.C. section 360bbb-3(b)(1), unless the authorization is terminated or revoked.     Resp Syncytial Virus by PCR NEGATIVE NEGATIVE    Comment: (NOTE) Fact Sheet for Patients: BloggerCourse.com  Fact Sheet for Healthcare Providers: SeriousBroker.it  This test is not yet approved or cleared  by the [place] and has been authorized for detection and/or diagnosis of SARS-CoV-2  by FDA under an Emergency Use Authorization (EUA). This EUA will remain in effect (meaning this test can be used) for the duration of the COVID-19 declaration under Section 564(b)(1) of the Act, 21 U.S.C. section 360bbb-3(b)(1), unless the authorization is terminated or revoked.  Performed at [facility], [address] [place], [place] [address]   Lactic acid, plasma     Status: None   Collection Time: 02/18/23  7:55 PM  Result Value Ref Range   Lactic Acid, Venous 0.9 0.5 - 1.9 mmol/L    Comment: Performed at [facility], [address] [place], [place] [address]  Urinalysis, w/ Reflex to Culture (Infection Suspected) -Urine, Catheterized     Status: Abnormal   Collection Time: 02/18/23 10:25 PM  Result Value Ref Range   Specimen Source URINE, CATHETERIZED    Color, Urine YELLOW YELLOW   APPearance CLEAR CLEAR   Specific Gravity, Urine 1.009 1.005 - 1.030   pH 6.0 5.0 - 8.0   Glucose, UA NEGATIVE NEGATIVE mg/dL   Hgb urine dipstick NEGATIVE NEGATIVE   Bilirubin Urine NEGATIVE NEGATIVE   Ketones, ur 5 (A) NEGATIVE mg/dL   Protein, ur NEGATIVE NEGATIVE mg/dL   Nitrite NEGATIVE NEGATIVE   Leukocytes,Ua NEGATIVE NEGATIVE   RBC / HPF 0-5 0 - 5 RBC/hpf   WBC, UA 0-5 0 - 5 WBC/hpf    Comment:        Reflex urine culture not performed if WBC <=10, OR if Squamous epithelial cells >5. If Squamous epithelial cells >5 suggest recollection.    Bacteria, UA NONE SEEN NONE SEEN   Squamous Epithelial / HPF 0-5 0 - 5 /HPF    Comment: Performed at [facility], [address], [place], [place] [address]   DG Chest Port 1 View  Result Date: 02/18/2023 CLINICAL DATA:  Sepsis EXAM: PORTABLE CHEST 1 VIEW COMPARISON:  05/01/2022 FINDINGS: Left-sided implanted cardiac device remains in place. Stable cardiomegaly. Aortic atherosclerosis. No focal airspace consolidation, pleural effusion, or pneumothorax. IMPRESSION: No active disease. Electronically Signed   By: Duanne Guess D.O.   On:  02/18/2023 21:04   CT Head Wo Contrast  Result Date: 02/18/2023 CLINICAL DATA:  Head trauma EXAM: CT HEAD WITHOUT CONTRAST TECHNIQUE: Contiguous axial images were obtained from the base of the skull through the vertex without intravenous contrast. RADIATION DOSE REDUCTION: This exam was performed according to the departmental dose-optimization program which includes automated exposure control, adjustment of the mA and/or kV according to patient size and/or use of iterative reconstruction technique. COMPARISON:  None Available. FINDINGS: Brain: There is no mass, hemorrhage or extra-axial collection. There is generalized atrophy without lobar predilection. Hypodensity of the white matter is most commonly associated with chronic microvascular disease. Vascular: Atherosclerotic calcification of the vertebral and internal carotid arteries at the skull base. No abnormal hyperdensity of the major intracranial arteries or dural venous sinuses. Skull: The visualized skull base, calvarium and extracranial soft tissues are normal. Sinuses/Orbits: No fluid levels or advanced mucosal thickening of the visualized paranasal sinuses. No mastoid or middle ear effusion. Normal orbits. IMPRESSION: 1. No acute intracranial abnormality. 2. Generalized atrophy and findings of chronic microvascular disease. Electronically Signed   By: Deatra Robinson M.D.   On: 02/18/2023 20:38    Pending Labs [employer] (From admission, onward)     Start     Ordered   Signed and Held  Comprehensive metabolic panel  Tomorrow morning,   R        Signed and Held   Signed and Held  Magnesium  Tomorrow morning,   R        Signed and Held   Signed and Held  CBC with Differential/Platelet  Tomorrow morning,   R        Signed and Held   Signed and Held  Blood gas, venous  Once,   R        Signed and Held   Signed and Held  RPR  (Dementia Panel (PNL))  Once,   R        Signed and Held   Signed and Held  Vitamin B12  (Dementia Panel (PNL))   Once,   R        Signed and Held   Signed and Held  Sedimentation rate  (Dementia Panel (PNL))  Once,   R        Signed and Held   Signed and Held  Folate  (Dementia Panel (PNL))  Once,   R        Signed and Held   Signed and Held  HIV Antibody (routine testing w rflx)  (HIV Antibody (Routine testing w reflex) panel)  Once,   R        Signed and Held            Vitals/Pain Today's Vitals   02/18/23 1719 02/18/23 1915 02/18/23 2105 02/18/23 2230  BP:   136/78 138/89  Pulse:   92 93  Resp:   16 16  Temp:   98 F (36.7 C)   TempSrc:   Oral   SpO2:   97% 98%  PainSc: 0-No pain 0-No pain      Isolation Precautions No active isolations  Medications Medications  sodium chloride 0.9 % bolus 1,000 mL (0 mLs Intravenous Stopped 02/18/23 2246)    Mobility walks with device     Focused Assessments Neuro Assessment Handoff:  Swallow screen pass?  N/A         Neuro Assessment: Exceptions to WDL Hx of dementia Neuro Checks:      Has TPA been given? No If patient is a Neuro Trauma and patient is going to OR before floor call report to 4N Charge nurse: [phone] or [phone]   R Recommendations: See Admitting Provider Note  Report given to:   Additional Notes: N/A

## 2023-02-18 NOTE — ED Triage Notes (Signed)
Pt arrived [facility] with c/o AMS and being more lethargic then normal. Pt has a UTI and been taking Bactrim DS x 5 days.

## 2023-02-18 NOTE — ED Notes (Signed)
Pt was changed and given another warm blanket, resting now.

## 2023-02-18 NOTE — ED Provider Notes (Signed)
[name] Provider Note   CSN: [id number] Arrival date & time: 02/18/23  1603     History  Chief Complaint  Patient presents with   Altered Mental Status    Kerry Carlson is a 89 y.o. female.  89 year old female with past medical history of dementia and atrial fibrillation on Eliquis as well as hypothyroidism, hypertension, hyperlipidemia, and diabetes presenting to the emergency department today with altered mental status.  The patient has been more somnolent at home over the past week.  She was seen at urgent care and was diagnosed with urinary tract infection.  She has been on Bactrim for this.  The patient has had decreased oral intake with this.  They report that she has been sleeping a lot and today it was hard to get her up out of bed which is why they brought her to the ER for further evaluation.  The patient did have a fall on Saturday.  She lost her footing and fell but did not hit her head and has not been complaining of any injuries.  She is on Eliquis for atrial fibrillation.   Altered Mental Status      Home Medications Prior to Admission medications   Medication Sig Start Date End Date Taking? Authorizing Provider  acetaminophen (TYLENOL) 500 MG tablet Take 1,000 mg by mouth 2 (two) times daily. 04/26/21  Yes [provider]  atenolol (TENORMIN) 100 MG tablet Take 1 tablet (100 mg total) by mouth 2 (two) times daily. 04/26/21  Yes Almon Hercules, MD  famotidine (PEPCID) 20 MG tablet Take 20 mg by mouth daily.   Yes [provider]  levothyroxine (SYNTHROID) 100 MCG tablet Take 100 mcg by mouth daily before breakfast.    Yes [provider]  Multiple Vitamin (MULTIVITAMIN WITH MINERALS) TABS tablet Take 1 tablet by mouth daily. 04/26/21  Yes Almon Hercules, MD  NAMENDA 10 MG tablet Take 10 mg by mouth 2 (two) times daily.   Yes [provider]  pravastatin (PRAVACHOL) 10 MG tablet Take 10 mg  by mouth daily.   Yes [provider]  Rivaroxaban (XARELTO) 15 MG TABS tablet Take 15 mg by mouth daily with supper.   Yes [provider]  sulfamethoxazole-trimethoprim (BACTRIM DS) 800-160 MG tablet Take 1 tablet by mouth 2 (two) times daily. 02/13/23  Yes [provider]      Allergies    Aspirin, Memantine, Nitrofurantoin, Sulfamethoxazole-trimethoprim, Naproxen, and Penicillins    Review of Systems   Review of Systems  Reason unable to perform ROS: Dementia, mental status.    Physical Exam Updated Vital Signs BP 138/89   Pulse 93   Temp 98 F (36.7 C) (Oral)   Resp 16   SpO2 98%  Physical Exam Vitals and nursing note reviewed.   Gen: Somnolent but will arouse to verbal stimuli Eyes: PERRL, EOMI HEENT: no oropharyngeal swelling Neck: trachea midline, no meningismus Resp: clear to auscultation bilaterally Card: RRR, no murmurs, rubs, or gallops Abd: nontender, nondistended Extremities: no calf tenderness, no edema Vascular: 2+ radial pulses bilaterally, 2+ DP pulses bilaterally Neuro: Cranial nerves intact, the patient will follow commands and has equal strength and all 4 extremities Skin: no rashes Psyc: acting appropriately   ED Results / Procedures / Treatments   Labs (all labs ordered are listed, but only abnormal results are displayed) Labs Reviewed  COMPREHENSIVE METABOLIC PANEL - Abnormal; Notable for the following components:  Result Value   Sodium 133 (*)    Glucose, Bld 113 (*)    Creatinine, Ser 1.28 (*)    GFR, Estimated 39 (*)    All other components within normal limits  PROTIME-INR - Abnormal; Notable for the following components:   Prothrombin Time 15.7 (*)    All other components within normal limits  APTT - Abnormal; Notable for the following components:   aPTT 41 (*)    All other components within normal limits  URINALYSIS, W/ REFLEX TO CULTURE (INFECTION SUSPECTED) - Abnormal; Notable for the following  components:   Ketones, ur 5 (*)    All other components within normal limits  CULTURE, BLOOD (ROUTINE X 2)  CULTURE, BLOOD (ROUTINE X 2)  RESP PANEL BY RT-PCR (RSV, FLU A&B, COVID)  RVPGX2  LACTIC ACID, PLASMA  LACTIC ACID, PLASMA  CBC WITH DIFFERENTIAL/PLATELET  AMMONIA  TSH    EKG None  Radiology DG Chest Port 1 View  Result Date: 02/18/2023 CLINICAL DATA:  Sepsis EXAM: PORTABLE CHEST 1 VIEW COMPARISON:  05/01/2022 FINDINGS: Left-sided implanted cardiac device remains in place. Stable cardiomegaly. Aortic atherosclerosis. No focal airspace consolidation, pleural effusion, or pneumothorax. IMPRESSION: No active disease. Electronically Signed   By: Duanne Guess D.O.   On: 02/18/2023 21:04   CT Head Wo Contrast  Result Date: 02/18/2023 CLINICAL DATA:  Head trauma EXAM: CT HEAD WITHOUT CONTRAST TECHNIQUE: Contiguous axial images were obtained from the base of the skull through the vertex without intravenous contrast. RADIATION DOSE REDUCTION: This exam was performed according to the departmental dose-optimization program which includes automated exposure control, adjustment of the mA and/or kV according to patient size and/or use of iterative reconstruction technique. COMPARISON:  None Available. FINDINGS: Brain: There is no mass, hemorrhage or extra-axial collection. There is generalized atrophy without lobar predilection. Hypodensity of the white matter is most commonly associated with chronic microvascular disease. Vascular: Atherosclerotic calcification of the vertebral and internal carotid arteries at the skull base. No abnormal hyperdensity of the major intracranial arteries or dural venous sinuses. Skull: The visualized skull base, calvarium and extracranial soft tissues are normal. Sinuses/Orbits: No fluid levels or advanced mucosal thickening of the visualized paranasal sinuses. No mastoid or middle ear effusion. Normal orbits. IMPRESSION: 1. No acute intracranial abnormality. 2.  Generalized atrophy and findings of chronic microvascular disease. Electronically Signed   By: Deatra Robinson M.D.   On: 02/18/2023 20:38    Procedures Procedures    Medications Ordered in ED Medications  sodium chloride 0.9 % bolus 1,000 mL (0 mLs Intravenous Stopped 02/18/23 2246)    ED Course/ Medical Decision Making/ A&P                                 Medical Decision Making 89 year old female with past medical history of atrial fibrillation on Eliquis, dementia, hypothyroidism, diabetes, hypertension, and hyperlipidemia presenting to the emergency department today with altered mental status.  The patient will arouse to verbal stimuli and does not any focal deficits here on exam.  Given her fall I will obtain a CT scan of her head to eval for intracranial hemorrhage.  Will obtain a sepsis workup on the patient although I am unclear if this is actual etiology for her symptoms.  I will hold off on antibiotics until a source is potentially identified.  Will also obtain a COVID and flu swab on the patient as this may be due to  viral etiology.  I will obtain a TSH as well as an ammonia level here as well.  The above workup will also evaluate for electrolyte abnormalities or uremia.  I will reevaluate for ultimate disposition.  The patient's workup is largely unremarkable.  CT scan is negative.  Her urinalysis and chest x-ray are unremarkable.  The patient continued to be somnolent here despite IV fluids.  After speaking with the patient's family this is a major change from her baseline.  They are concerned because the patient is unable to perform any of her ADLs.  Given the acute change calls placed to hospitalist service for admission for encephalopathy.  Amount and/or Complexity of Data Reviewed Labs: ordered. Radiology: ordered. ECG/medicine tests: ordered.  Risk Decision regarding hospitalization.           Final Clinical Impression(s) / ED Diagnoses Final diagnoses:   Encephalopathy, unspecified type    Rx / DC Orders ED Discharge Orders     None         Durwin Glaze, MD 02/18/23 2255

## 2023-02-19 ENCOUNTER — Observation Stay (HOSPITAL_COMMUNITY)
Admit: 2023-02-19 | Discharge: 2023-02-19 | Disposition: A | Discharge status: Home / Self Care | Payer: Medicare Other | Attending: Internal Medicine

## 2023-02-19 DIAGNOSIS — F0390 Unspecified dementia without behavioral disturbance: Secondary | ICD-10-CM | POA: Insufficient documentation

## 2023-02-19 DIAGNOSIS — I480 Paroxysmal atrial fibrillation: Secondary | ICD-10-CM

## 2023-02-19 DIAGNOSIS — K219 Gastro-esophageal reflux disease without esophagitis: Secondary | ICD-10-CM

## 2023-02-19 DIAGNOSIS — R569 Unspecified convulsions: Secondary | ICD-10-CM | POA: Diagnosis not present

## 2023-02-19 DIAGNOSIS — E785 Hyperlipidemia, unspecified: Secondary | ICD-10-CM

## 2023-02-19 DIAGNOSIS — G9340 Encephalopathy, unspecified: Secondary | ICD-10-CM | POA: Diagnosis not present

## 2023-02-19 DIAGNOSIS — R4182 Altered mental status, unspecified: Secondary | ICD-10-CM | POA: Diagnosis not present

## 2023-02-19 DIAGNOSIS — Z66 Do not resuscitate: Secondary | ICD-10-CM

## 2023-02-19 DIAGNOSIS — E079 Disorder of thyroid, unspecified: Secondary | ICD-10-CM

## 2023-02-19 LAB — COMPREHENSIVE METABOLIC PANEL
ALT: 12 U/L (ref 0–44)
AST: 14 U/L — ABNORMAL LOW (ref 15–41)
Albumin: 3.1 g/dL — ABNORMAL LOW (ref 3.5–5.0)
Alkaline Phosphatase: 35 U/L — ABNORMAL LOW (ref 38–126)
Anion gap: 9 (ref 5–15)
BUN: 13 mg/dL (ref 8–23)
CO2: 23 mmol/L (ref 22–32)
Calcium: 8.5 mg/dL — ABNORMAL LOW (ref 8.9–10.3)
Chloride: 103 mmol/L (ref 98–111)
Creatinine, Ser: 1.25 mg/dL — ABNORMAL HIGH (ref 0.44–1.00)
GFR, Estimated: 40 mL/min — ABNORMAL LOW (ref >60)
Glucose, Bld: 74 mg/dL (ref 70–99)
Potassium: 4.2 mmol/L (ref 3.5–5.1)
Sodium: 135 mmol/L (ref 135–145)
Total Bilirubin: 0.7 mg/dL (ref 0.3–1.2)
Total Protein: 5.4 g/dL — ABNORMAL LOW (ref 6.5–8.1)

## 2023-02-19 LAB — BLOOD GAS, VENOUS
Acid-Base Excess: 1.5 mmol/L (ref 0.0–2.0)
Bicarbonate: 25.9 mmol/L (ref 20.0–28.0)
Drawn by: 1528
O2 Saturation: 56.1 %
Patient temperature: 36.7
pCO2, Ven: 38 mm[Hg] — ABNORMAL LOW (ref 44–60)
pH, Ven: 7.43 (ref 7.25–7.43)
pO2, Ven: <31 mm[Hg] — CL (ref 32–45)

## 2023-02-19 LAB — CBC WITH DIFFERENTIAL/PLATELET
Abs Immature Granulocytes: 0.02 10*3/uL (ref 0.00–0.07)
Basophils Absolute: 0 10*3/uL (ref 0.0–0.1)
Basophils Relative: 0 %
Eosinophils Absolute: 0.1 10*3/uL (ref 0.0–0.5)
Eosinophils Relative: 2 %
HCT: 33.5 % — ABNORMAL LOW (ref 36.0–46.0)
Hemoglobin: 11 g/dL — ABNORMAL LOW (ref 12.0–15.0)
Immature Granulocytes: 0 %
Lymphocytes Relative: 42 %
Lymphs Abs: 2.9 10*3/uL (ref 0.7–4.0)
MCH: 31.4 pg (ref 26.0–34.0)
MCHC: 32.8 g/dL (ref 30.0–36.0)
MCV: 95.7 fL (ref 80.0–100.0)
Monocytes Absolute: 0.5 10*3/uL (ref 0.1–1.0)
Monocytes Relative: 7 %
Neutro Abs: 3.3 10*3/uL (ref 1.7–7.7)
Neutrophils Relative %: 49 %
Platelets: 201 10*3/uL (ref 150–400)
RBC: 3.5 MIL/uL — ABNORMAL LOW (ref 3.87–5.11)
RDW: 13.2 % (ref 11.5–15.5)
WBC: 6.9 10*3/uL (ref 4.0–10.5)
nRBC: 0 % (ref 0.0–0.2)

## 2023-02-19 LAB — RPR: RPR Ser Ql: NONREACTIVE

## 2023-02-19 LAB — VITAMIN B12: Vitamin B-12: 288 pg/mL (ref 180–914)

## 2023-02-19 LAB — MAGNESIUM: Magnesium: 2.1 mg/dL (ref 1.7–2.4)

## 2023-02-19 LAB — SEDIMENTATION RATE: Sed Rate: 5 mm/h (ref 0–22)

## 2023-02-19 LAB — FOLATE: Folate: >40 ng/mL (ref >5.9)

## 2023-02-19 LAB — HIV ANTIBODY (ROUTINE TESTING W REFLEX): HIV Screen 4th Generation wRfx: NONREACTIVE

## 2023-02-19 MED ORDER — INFLUENZA VAC A&B SURF ANT ADJ 0.5 ML IM SUSY
0.5000 mL | PREFILLED_SYRINGE | INTRAMUSCULAR | Status: AC
  Administered 2023-02-20: 0.5 mL via INTRAMUSCULAR
  Filled 2023-02-19: qty 0.5

## 2023-02-19 MED ORDER — CYANOCOBALAMIN 1000 MCG/ML IJ SOLN
1000.0000 ug | Freq: Once | INTRAMUSCULAR | Status: AC
  Administered 2023-02-19: 1000 ug via INTRAMUSCULAR
  Filled 2023-02-19: qty 1

## 2023-02-19 MED ORDER — ORAL CARE MOUTH RINSE: 15.0000 mL | OROMUCOSAL | Status: DC | PRN

## 2023-02-19 NOTE — Assessment & Plan Note (Signed)
-   Continue beta-blocker and Xarelto - Monitor on telemetry

## 2023-02-19 NOTE — Procedures (Signed)
Patient Name: Kerry Carlson  MRN: [id number]  Epilepsy Attending: Charlsie Quest  Referring Physician/Provider: Vassie Loll, MD  Date: 02/19/2023 Duration: 23.43 mins  Patient history: 89yo F with ams getting eeg to evaluate for seizure  Level of alertness: Awake  AEDs during EEG study: None  Technical aspects: This EEG study was done with scalp electrodes positioned according to the 10-20 International system of electrode placement. Electrical activity was reviewed with band pass filter of 1-70Hz , sensitivity of 7 uV/mm, display speed of 78mm/sec with a 60Hz  notched filter applied as appropriate. EEG data were recorded continuously and digitally stored.  Video monitoring was available and reviewed as appropriate.  Description: The posterior dominant rhythm consists of 8 Hz activity of moderate voltage (25-35 uV) seen predominantly in posterior head regions, symmetric and reactive to eye opening and eye closing. EEG showed intermittent generalized 3 to 6 Hz theta-delta slowing. Hyperventilation and photic stimulation were not performed.     ABNORMALITY - Intermittent slow, generalized  IMPRESSION: This study is suggestive of mild diffuse encephalopathy. No seizures or epileptiform discharges were seen throughout the recording.  Mat Stuard Annabelle Harman

## 2023-02-19 NOTE — Progress Notes (Signed)
Pt is active with [facility] outpatient palliative.  Transition of Care Department [facility]) has reviewed patient and no TOC needs have been identified at this time. We will continue to monitor patient advancement through interdisciplinary progression rounds. If new patient transition needs arise, please place a TOC consult.   02/19/23 0801  TOC Brief Assessment  Insurance and Status Reviewed  Patient has primary care physician Yes  Home environment has been reviewed Lives with son and daughter-in-law.  Prior level of function: Some assist with ADLs.  Prior/Current Home Services No current home services  Social Determinants of Health Reivew SDOH reviewed no interventions necessary  Readmission risk has been reviewed Yes  Transition of care needs no transition of care needs at this time

## 2023-02-19 NOTE — Assessment & Plan Note (Signed)
Continue Pepcid  

## 2023-02-19 NOTE — Plan of Care (Signed)
  Problem: Activity: Goal: Risk for activity intolerance will decrease Outcome: Progressing   Problem: Coping: Goal: Level of anxiety will decrease Outcome: Progressing   

## 2023-02-19 NOTE — Plan of Care (Signed)
  Problem: Acute Rehab PT Goals(only PT should resolve) Goal: Pt Will Go Supine/Side To Sit Outcome: Progressing Flowsheets (Taken 02/19/2023 1445) Pt will go Supine/Side to Sit: with contact guard assist Goal: Patient Will Transfer Sit To/From Stand Outcome: Progressing Flowsheets (Taken 02/19/2023 1445) Patient will transfer sit to/from stand: with contact guard assist Goal: Pt Will Transfer Bed To Chair/Chair To Bed Outcome: Progressing Flowsheets (Taken 02/19/2023 1445) Pt will Transfer Bed to Chair/Chair to Bed: with contact guard assist Goal: Pt Will Ambulate Outcome: Progressing Flowsheets (Taken 02/19/2023 1445) Pt will Ambulate:  50 feet  with contact guard assist  with minimal assist  with rolling walker   2:46 PM, 02/19/23 Ocie Bob, MPT Physical Therapist with [facility] [phone] office [phone] mobile phone

## 2023-02-19 NOTE — Progress Notes (Signed)
Patient seen and examined.  Admitted after midnight secondary to altered mental status.  Apparently for the last week prior to admission increased somnolence and lethargy as an outpatient.  Family felt patient had a UTI and was taken to an urgent care where she was prescribed antibiotic therapy without any significant improvement and changes in her status.  There was also reports of a mechanical fall and the patient is on chronic anticoagulation.  CT scan of the head not demonstrating acute intracranial normality or signs of hemorrhage.  Chest x-ray without acute cardiopulmonary process.  There has not been any fevers and currently urinalysis not demonstrating acute infection.  Please refer to H&P written by Dr. Carren Rang for further info/details on her presentation.  Plan: -Continue supportive care and minimize sedative agents -B12 level has been checked and was borderline low; at 1000 mcg intramuscular injection x 1 will be provided. -TSH and ammonia within normal limits and RPR nonreactive. -Will order EEG -PT/OT evaluation also requested. -Continue supportive care and follow clinical response.  Vassie Loll MD [phone]

## 2023-02-19 NOTE — H&P (Signed)
History and Physical    Patient: Kerry Carlson HYQ:[id number] DOB: 01-Jun-1925 DOA: 02/18/2023 DOS: the patient was seen and examined on 02/19/2023 PCP: Tisovec, Adelfa Koh, MD  Patient coming from: Home  Chief Complaint:  Chief Complaint  Patient presents with   Altered Mental Status   HPI: [name] is a 89 y.o. female with medical history significant of atrial fibrillation, diet-controlled diabetes, hyperlipidemia, hypertension, pacemaker placement, thyroid disease, and more presents the ED with a chief complaint of altered mental status.  It is reported the patient is usually independent with her own ADLs.  Over the last week she has been quite somnolent.  They thought she had a UTI at urgent care and she was treated empirically with Bactrim.  She has had no improvement in her somnolence.  Patient did have a fall over the weekend.  It was a mechanical fall she did not hit her head.  She has not been complaining of any injuries.  She is on a blood thinner.  CT head did not show any intracranial abnormality.  Chest x-ray was without acute abnormality.  At the time of my exam patient is quite somnolent.  She opens her eyes to touch but does not respond to my questions.  No further history could be obtained at this time.  DNR per chart review. Review of Systems: unable to review all systems due to the inability of the patient to answer questions. Past Medical History:  Diagnosis Date   Atrial fibrillation (HCC)    Diabetes mellitus without complication (HCC)    Type 2   Hyperlipidemia    Hypertension    Pacemaker - Medtronic Azure single chamber pacemaker 07/12/2016. 05/23/2020   Second degree Mobitz II AV block 05/23/2020   Thyroid disease    Hypothyroid   Past Surgical History:  Procedure Laterality Date   ABDOMINAL HYSTERECTOMY     PACEMAKER INSERTION  10/11/2016   Medtronic Azure MRI single-chamber pacemaker in [facility]   TOTAL HIP ARTHROPLASTY Right 04/24/2021   Procedure: PARTIAL  vs. TOTAL HIP ARTHROPLASTY ANTERIOR APPROACH;  Surgeon: Kathryne Hitch, MD;  Location: [facility] OR;  Service: Orthopedics;  Laterality: Right;   Social History:  reports that she has never smoked. She has never used smokeless tobacco. She reports that she does not drink alcohol and does not use drugs.  Allergies  Allergen Reactions   Aspirin Nausea And Vomiting   Memantine     Other Reaction(s): was more sedated   Nitrofurantoin Other (See Comments)   Sulfamethoxazole-Trimethoprim     Other Reaction(s): increased confusion   Naproxen Hives and Rash   Penicillins Hives, Rash and Other (See Comments)    Had a really long time ago, caused hives    Family History  Problem Relation Age of Onset   Stroke Mother    Stroke Father     Prior to Admission medications   Medication Sig Start Date End Date Taking? Authorizing Provider  acetaminophen (TYLENOL) 500 MG tablet Take 1,000 mg by mouth 2 (two) times daily. 04/26/21  Yes [provider]  atenolol (TENORMIN) 100 MG tablet Take 1 tablet (100 mg total) by mouth 2 (two) times daily. 04/26/21  Yes Almon Hercules, MD  famotidine (PEPCID) 20 MG tablet Take 20 mg by mouth daily.   Yes [provider]  levothyroxine (SYNTHROID) 100 MCG tablet Take 100 mcg by mouth daily before breakfast.    Yes [provider]  Multiple Vitamin (MULTIVITAMIN WITH MINERALS) TABS tablet  Take 1 tablet by mouth daily. 04/26/21  Yes Almon Hercules, MD  NAMENDA 10 MG tablet Take 10 mg by mouth 2 (two) times daily.   Yes [provider]  pravastatin (PRAVACHOL) 10 MG tablet Take 10 mg by mouth daily.   Yes [provider]  Rivaroxaban (XARELTO) 15 MG TABS tablet Take 15 mg by mouth daily with supper.   Yes [provider]  sulfamethoxazole-trimethoprim (BACTRIM DS) 800-160 MG tablet Take 1 tablet by mouth 2 (two) times daily. 02/13/23  Yes [provider]    Physical Exam: Vitals:   02/18/23 2330 02/19/23  0032 02/19/23 0112 02/19/23 0509  BP: (!) 142/78  131/67 137/69  Pulse: 82  68 99  Resp: 16  18 18   Temp: 98 F (36.7 C)  98 F (36.7 C)   TempSrc: Oral     SpO2: 98%  100% 97%  Weight:  72.6 kg     1.  General: Patient lying supine in bed,  no acute distress   2. Psychiatric: Somnolent and not following commands   3. Neurologic: Somnolent and not following commands   4. HEENMT:  Head is atraumatic, normocephalic, pupils reactive to light, neck is supple, trachea is midline, mucous membranes are moist   5. Respiratory : Lungs are clear to auscultation bilaterally without wheezing, rhonchi, rales, no cyanosis, no increase in work of breathing or accessory muscle use   6. Cardiovascular : Heart rate normal, rhythm is regular, no murmurs, rubs or gallops, trace peripheral edema, peripheral pulses palpated   7. Gastrointestinal:  Abdomen is soft, nondistended, nontender to palpation bowel sounds active, no masses or organomegaly palpated   8. Skin:  Skin is warm, dry and intact without rashes, acute lesions, or ulcers on limited exam   9.Musculoskeletal:  No acute deformities or trauma, no asymmetry in tone, trace peripheral edema, peripheral pulses palpated and pedal pulse marked, no tenderness to palpation in the extremities  Data Reviewed: In the ED Afebrile, heart rate slightly tachycardic 92-95, respiratory 15-16, blood pressure within normal range UA is not indicative of UTI Negative COVID CT head shows no acute intracranial abnormality Chest x-ray is without acute changes Blood culture pending No leukocytosis, hemoglobin stable Chemistry reveals a slight bump in creatinine from 1.08-1.28 TSH is normal Ammonia is undetectable Admission was requested for altered mental status  Assessment and Plan: * Altered mental status - CT head is without acute intracranial abnormality - Altered mental status is described as being more  somnolent than normal - She was  treated outpatient with Bactrim for UTI, no improvement - Ammonia is less than 10 - TSH is normal - No leukocytosis or indication of infection - Blood culture pending - No significant electrolyte abnormalities - Negative COVID - No sedating medications at home - Is unclear etiology at this time  Paroxysmal atrial fibrillation (HCC) - Continue beta-blocker and Xarelto - Monitor on telemetry  Dementia (HCC) - Continue Namenda  GERD (gastroesophageal reflux disease) - Continue Pepcid  DNR (do not resuscitate) - Per chart review last admission showed a CODE STATUS of DNR - Patient is not able to discuss this at this time and there are no ACP documents on file - Ordering DNR as per chart review  Thyroid disease - Continue Synthroid  Hypertension - Continue beta-blocker  Hyperlipidemia - Continue statin      Advance Care Planning:   Code Status: Limited: Do not attempt resuscitation (DNR) -DNR-LIMITED -Do Not Intubate/DNI   Consults: None  at this time  Family Communication: No family at bedside  Severity of Illness: The appropriate patient status for this patient is OBSERVATION. Observation status is judged to be reasonable and necessary in order to provide the required intensity of service to ensure the patient's safety. The patient's presenting symptoms, physical exam findings, and initial radiographic and laboratory data in the context of their medical condition is felt to place them at decreased risk for further clinical deterioration. Furthermore, it is anticipated that the patient will be medically stable for discharge from the hospital within 2 midnights of admission.   Author: Lilyan Gilford, DO 02/19/2023 6:14 AM  For on call review [email or web address].

## 2023-02-19 NOTE — Assessment & Plan Note (Signed)
Continue statin. 

## 2023-02-19 NOTE — Assessment & Plan Note (Signed)
Continue Synthroid °

## 2023-02-19 NOTE — Assessment & Plan Note (Signed)
Continue Namenda

## 2023-02-19 NOTE — Assessment & Plan Note (Signed)
Continue beta blocker. 

## 2023-02-19 NOTE — Assessment & Plan Note (Signed)
-   CT head is without acute intracranial abnormality - Altered mental status is described as being more  somnolent than normal - She was treated outpatient with Bactrim for UTI, no improvement - Ammonia is less than 10 - TSH is normal - No leukocytosis or indication of infection - Blood culture pending - No significant electrolyte abnormalities - Negative COVID - No sedating medications at home - Is unclear etiology at this time

## 2023-02-19 NOTE — Evaluation (Signed)
Physical Therapy Evaluation Patient Details Name: Kerry Carlson MRN: [id number] DOB: March 02, 1926 Today's Date: 02/19/2023  History of Present Illness  Kerry Carlson is a 89 y.o. female with medical history significant of atrial fibrillation, diet-controlled diabetes, hyperlipidemia, hypertension, pacemaker placement, thyroid disease, and more presents the ED with a chief complaint of altered mental status.  It is reported the patient is usually independent with her own ADLs.  Over the last week she has been quite somnolent.  They thought she had a UTI at urgent care and she was treated empirically with Bactrim.  She has had no improvement in her somnolence.  Patient did have a fall over the weekend.  It was a mechanical fall she did not hit her head.  She has not been complaining of any injuries.  She is on a blood thinner.  CT head did not show any intracranial abnormality.  Chest x-ray was without acute abnormality.  At the time of my exam patient is quite somnolent.  She opens her eyes to touch but does not respond to my questions.  No further history could be obtained at this time.   Clinical Impression  Patient has difficulty scooting to EOB due to weakness, incontinent of urine upon standing, able to ambulate to bathroom to sit on commode, tends to drift left/right during ambulation in hallway requiring Min assist to avoid loss of balance and limited for gait training mostly due to c/o fatigue/mild SOB.  Patient tolerated sitting up in chair after therapy - nurse notified.  Patient will benefit from continued skilled physical therapy in hospital and recommended venue below to increase strength, balance, endurance for safe ADLs and gait.          If plan is discharge home, recommend the following: A lot of help with walking and/or transfers;Help with stairs or ramp for entrance;Assistance with cooking/housework;A lot of help with bathing/dressing/bathroom   Can travel by private vehicle    Yes    Equipment Recommendations Rolling walker (2 wheels)  Recommendations for Other Services       Functional Status Assessment Patient has had a recent decline in their functional status and demonstrates the ability to make significant improvements in function in a reasonable and predictable amount of time.     Precautions / Restrictions Precautions Precautions: Fall Restrictions Weight Bearing Restrictions: No      Mobility  Bed Mobility Overal bed mobility: Needs Assistance Bed Mobility: Supine to Sit     Supine to sit: Min assist, Mod assist     General bed mobility comments: increased time, labored movement    Transfers Overall transfer level: Needs assistance Equipment used: Rolling walker (2 wheels) Transfers: Sit to/from Stand, Bed to chair/wheelchair/BSC Sit to Stand: Min assist   Step pivot transfers: Min assist       General transfer comment: increased time, labored movement, incontinent of urine when standing    Ambulation/Gait Ambulation/Gait assistance: Min assist Gait Distance (Feet): 40 Feet Assistive device: Rolling walker (2 wheels) Gait Pattern/deviations: Decreased step length - right, Decreased step length - left, Decreased stride length Gait velocity: decreased     General Gait Details: slow labored cadence with flexed trunk, occasional drifting right/left requiring verbal/tactile cueing to avoid loss of balance and limited mostly due to c/o fatigue with mild SOB  Stairs            Wheelchair Mobility     Tilt Bed    Modified Rankin (Stroke Patients Only)  Balance Overall balance assessment: Needs assistance Sitting-balance support: Feet supported, No upper extremity supported Sitting balance-Leahy Scale: Fair Sitting balance - Comments: fair/good seated at EOB   Standing balance support: During functional activity, Reliant on assistive device for balance, Bilateral upper extremity supported Standing  balance-Leahy Scale: Poor Standing balance comment: fair/poor using RW                             Pertinent Vitals/Pain Pain Assessment Pain Assessment: No/denies pain    Home Living Family/patient expects to be discharged to:: Private residence Living Arrangements: Children;Other relatives Available Help at Discharge: Family;Available 24 hours/day Type of Home: House Home Access: Stairs to enter Entrance Stairs-Rails: None Entrance Stairs-Number of Steps: 1 Alternate Level Stairs-Number of Steps: flight Home Layout: Two level;Able to live on main level with bedroom/bathroom;Full bath on main level Home Equipment: Standard Walker      Prior Function Prior Level of Function : Needs assist       Physical Assist : Mobility (physical);ADLs (physical) Mobility (physical): Bed mobility;Transfers;Gait;Stairs   Mobility Comments: household ambulator using RW ADLs Comments: Assisted by family     Extremity/Trunk Assessment   Upper Extremity Assessment Upper Extremity Assessment: Generalized weakness    Lower Extremity Assessment Lower Extremity Assessment: Generalized weakness    Cervical / Trunk Assessment Cervical / Trunk Assessment: Kyphotic  Communication   Communication Communication: No apparent difficulties Cueing Techniques: Verbal cues;Tactile cues  Cognition Arousal: Alert Behavior During Therapy: WFL for tasks assessed/performed Overall Cognitive Status: Within Functional Limits for tasks assessed                                          General Comments      Exercises     Assessment/Plan    PT Assessment Patient needs continued PT services  PT Problem List Decreased strength;Decreased activity tolerance;Decreased balance;Decreased mobility       PT Treatment Interventions DME instruction;Gait training;Stair training;Functional mobility training;Therapeutic activities;Therapeutic exercise;Balance  training;Patient/family education    PT Goals (Current goals can be found in the Care Plan section)  Acute Rehab PT Goals Patient Stated Goal: return home PT Goal Formulation: With patient/family Time For Goal Achievement: 03/05/23 Potential to Achieve Goals: Good    Frequency Min 3X/week     Co-evaluation               AM-PAC PT "6 Clicks" Mobility  Outcome Measure Help needed turning from your back to your side while in a flat bed without using bedrails?: A Little Help needed moving from lying on your back to sitting on the side of a flat bed without using bedrails?: A Little Help needed moving to and from a bed to a chair (including a wheelchair)?: A Little Help needed standing up from a chair using your arms (e.g., wheelchair or bedside chair)?: A Little Help needed to walk in hospital room?: A Lot Help needed climbing 3-5 steps with a railing? : A Lot 6 Click Score: 16    End of Session   Activity Tolerance: Patient tolerated treatment well;Patient limited by fatigue Patient left: in chair;with call bell/phone within reach;with chair alarm set Nurse Communication: Mobility status PT Visit Diagnosis: Unsteadiness on feet (R26.81);Other abnormalities of gait and mobility (R26.89);Muscle weakness (generalized) (M62.81)    Time: 4098-1191 PT Time Calculation (min) (ACUTE ONLY): 28 min  Charges:   PT Evaluation $PT Eval Moderate Complexity: 1 Mod   PT General Charges $$ ACUTE PT VISIT: 1 Visit         2:44 PM, 02/19/23 Ocie Bob, MPT Physical Therapist with [facility] 336 [phone] office [phone] mobile phone

## 2023-02-19 NOTE — Assessment & Plan Note (Signed)
-   Per chart review last admission showed a CODE STATUS of DNR - Patient is not able to discuss this at this time and there are no ACP documents on file - Ordering DNR as per chart review

## 2023-02-19 NOTE — Progress Notes (Signed)
EEG complete - results pending 

## 2023-02-20 DIAGNOSIS — R4182 Altered mental status, unspecified: Secondary | ICD-10-CM | POA: Diagnosis not present

## 2023-02-20 DIAGNOSIS — G9340 Encephalopathy, unspecified: Secondary | ICD-10-CM | POA: Diagnosis not present

## 2023-02-20 LAB — BASIC METABOLIC PANEL
Anion gap: 7 (ref 5–15)
BUN: 14 mg/dL (ref 8–23)
CO2: 26 mmol/L (ref 22–32)
Calcium: 8.6 mg/dL — ABNORMAL LOW (ref 8.9–10.3)
Chloride: 102 mmol/L (ref 98–111)
Creatinine, Ser: 1.3 mg/dL — ABNORMAL HIGH (ref 0.44–1.00)
GFR, Estimated: 38 mL/min — ABNORMAL LOW (ref >60)
Glucose, Bld: 83 mg/dL (ref 70–99)
Potassium: 3.9 mmol/L (ref 3.5–5.1)
Sodium: 135 mmol/L (ref 135–145)

## 2023-02-20 MED ORDER — DEXTROSE IN LACTATED RINGERS 5 % IV SOLN: INTRAVENOUS | Status: AC

## 2023-02-20 NOTE — Progress Notes (Signed)
Mobility Specialist Progress Note:    02/20/23 1400  Mobility  Activity Ambulated with assistance in hallway  Level of Assistance Minimal assist, patient does 75% or more  Assistive Device Front wheel walker  Distance Ambulated (ft) 30 ft  Range of Motion/Exercises Active;All extremities  Activity Response Tolerated well  Mobility Referral Yes  $Mobility charge 1 Mobility  Mobility Specialist Start Time (ACUTE ONLY) 1400  Mobility Specialist Stop Time (ACUTE ONLY) 1420  Mobility Specialist Time Calculation (min) (ACUTE ONLY) 20 min   Pt received in bed, family in room. Agreeable to mobility, required MinA to stand and ambulate with RW. Tolerated well, pt unsteady on left side. Returned pt to room, let supine. Alarm on, all needs met.   Lawerance Bach Mobility Specialist Please contact via Special educational needs teacher or  Rehab office at [phone]

## 2023-02-20 NOTE — Progress Notes (Signed)
PROGRESS NOTE    Kerry Carlson  BMW:[id number] DOB: 02/26/1926 DOA: 02/18/2023 PCP: Gaspar Garbe, MD   Brief Narrative:    Kerry Carlson is a 89 y.o. female with medical history significant of atrial fibrillation, diet-controlled diabetes, hyperlipidemia, hypertension, pacemaker placement, thyroid disease, and more presents the ED with a chief complaint of altered mental status.  She has been admitted with acute encephalopathy with negative workup thus far and appears to have progression of her dementia.  She continues to remain very somnolent and does not have adequate oral intake per son at bedside.  Assessment & Plan:   Principal Problem:   Altered mental status Active Problems:   Hyperlipidemia   Hypertension   Thyroid disease   DNR (do not resuscitate)   GERD (gastroesophageal reflux disease)   Dementia (HCC)   Paroxysmal atrial fibrillation (HCC)  Assessment and Plan:   Acute encephalopathy - CT head is without acute intracranial abnormality - Altered mental status is described as being more  somnolent than normal - She was treated outpatient with Bactrim for UTI, no improvement - Ammonia is less than 10 - TSH is normal - No leukocytosis or indication of infection - Blood culture with no growth to date - No significant electrolyte abnormalities - Negative COVID - No sedating medications at home - Appears to be related to progression of dementia   Paroxysmal atrial fibrillation (HCC) - Continue beta-blocker and Xarelto - Monitor on telemetry   Dementia (HCC)-progressing - Continue Namenda -Poor oral intake noted with some weight loss and some failure to thrive -Consult to palliative care due to overall poor prognosis -Plan to start D5 IV fluid   GERD (gastroesophageal reflux disease) - Continue Pepcid   DNR (do not resuscitate) - Per chart review last admission showed a CODE STATUS of DNR - Patient is not able to discuss this at this time and there  are no ACP documents on file - Ordering DNR as per chart review   Thyroid disease - Continue Synthroid   Hypertension - Continue beta-blocker   Hyperlipidemia - Continue statin    DVT prophylaxis:Xarelto discontinued due to high fall risk Code Status: DNR Family Communication: Son at bedside 10/30 Disposition Plan:  Status is: Observation The patient will require care spanning > 2 midnights and should be moved to inpatient because: Need for IV fluid   Consultants:  Palliative care  Procedures:  None  Antimicrobials:  None   Subjective: Patient seen and evaluated today with ongoing issues with somnolence and confusion that have not improved since admission.  She apparently has not been eating according to the son at bedside and will only have some snacks occasionally.  Objective: Vitals:   02/19/23 0112 02/19/23 0509 02/19/23 1319 02/19/23 2348  BP: 131/67 137/69 132/69 (!) 154/83  Pulse: 68 99 80 91  Resp: 18 18 18 20   Temp: 98 F (36.7 C)  97.9 F (36.6 C) 97.8 F (36.6 C)  TempSrc:   Axillary Oral  SpO2: 100% 97% 100% 97%  Weight:        Intake/Output Summary (Last 24 hours) at 02/20/2023 0803 Last data filed at 02/19/2023 1900 Gross per 24 hour  Intake 480 ml  Output 300 ml  Net 180 ml   Filed Weights   02/19/23 0032  Weight: 72.6 kg    Examination:  General exam: Appears somnolent but arousable Respiratory system: Clear to auscultation. Respiratory effort normal. Cardiovascular system: S1 & S2 heard, RRR.  Gastrointestinal system: Abdomen  is soft Central nervous system: Somnolent but arousable Extremities: No edema Skin: No significant lesions noted    Data Reviewed: I have personally reviewed following labs and imaging studies  CBC: Recent Labs  Lab 02/18/23 1728 02/19/23 0448  WBC 7.9 6.9  NEUTROABS 4.7 3.3  HGB 12.8 11.0*  HCT 40.3 33.5*  MCV 96.0 95.7  PLT 236 201   Basic Metabolic Panel: Recent Labs  Lab 02/18/23 1728  02/19/23 0448 02/20/23 0421  NA 133* 135 135  K 4.5 4.2 3.9  CL 99 103 102  CO2 26 23 26   GLUCOSE 113* 74 83  BUN 14 13 14   CREATININE 1.28* 1.25* 1.30*  CALCIUM 9.1 8.5* 8.6*  MG  --  2.1  --    GFR: CrCl cannot be calculated (Unknown ideal weight.). Liver Function Tests: Recent Labs  Lab 02/18/23 1728 02/19/23 0448  AST 18 14*  ALT 14 12  ALKPHOS 47 35*  BILITOT 0.7 0.7  PROT 7.0 5.4*  ALBUMIN 4.1 3.1*   No results for input(s): "LIPASE", "AMYLASE" in the last 168 hours. Recent Labs  Lab 02/18/23 1728  AMMONIA <10   Coagulation Profile: Recent Labs  Lab 02/18/23 1728  INR 1.2   Cardiac Enzymes: No results for input(s): "CKTOTAL", "CKMB", "CKMBINDEX", "TROPONINI" in the last 168 hours. BNP (last 3 results) No results for input(s): "PROBNP" in the last 8760 hours. HbA1C: No results for input(s): "HGBA1C" in the last 72 hours. CBG: No results for input(s): "GLUCAP" in the last 168 hours. Lipid Profile: No results for input(s): "CHOL", "HDL", "LDLCALC", "TRIG", "CHOLHDL", "LDLDIRECT" in the last 72 hours. Thyroid Function Tests: Recent Labs    02/18/23 1728  TSH 2.255   Anemia Panel: Recent Labs    02/18/23 1955  VITAMINB12 288  FOLATE >40.0   Sepsis Labs: Recent Labs  Lab 02/18/23 1728 02/18/23 1955  LATICACIDVEN 0.9 0.9    Recent Results (from the past 240 hour(s))  Blood Culture (routine x 2)     Status: None (Preliminary result)   Collection Time: 02/18/23  5:28 PM   Specimen: BLOOD  Result Value Ref Range Status   Specimen Description BLOOD BLOOD LEFT ARM  Final   Special Requests   Final    BOTTLES DRAWN AEROBIC AND ANAEROBIC Blood Culture results may not be optimal due to an excessive volume of blood received in culture bottles   Culture   Final    NO GROWTH < 24 HOURS Performed at [facility], [address], [place], [place] [address]    Report Status PENDING  Incomplete  Blood Culture (routine x 2)     Status: None  (Preliminary result)   Collection Time: 02/18/23  5:36 PM   Specimen: BLOOD  Result Value Ref Range Status   Specimen Description BLOOD BLOOD LEFT HAND  Final   Special Requests   Final    BOTTLES DRAWN AEROBIC ONLY Blood Culture adequate volume   Culture   Final    NO GROWTH < 24 HOURS Performed at [facility], [address] [place], [place] [address]    Report Status PENDING  Incomplete  Resp panel by RT-PCR (RSV, Flu A&B, Covid) Anterior Nasal Swab     Status: None   Collection Time: 02/18/23  5:45 PM   Specimen: Anterior Nasal Swab  Result Value Ref Range Status   SARS Coronavirus 2 by RT PCR NEGATIVE NEGATIVE Final    Comment: (NOTE) SARS-CoV-2 target nucleic acids are NOT DETECTED.  The  SARS-CoV-2 RNA is generally detectable in upper respiratory specimens during the acute phase of infection. The lowest concentration of SARS-CoV-2 viral copies this assay can detect is 138 copies/mL. A negative result does not preclude SARS-Cov-2 infection and should not be used as the sole basis for treatment or other patient management decisions. A negative result may occur with  improper specimen collection/handling, submission of specimen other than nasopharyngeal swab, presence of viral mutation(s) within the areas targeted by this assay, and inadequate number of viral copies(<138 copies/mL). A negative result must be combined with clinical observations, patient history, and epidemiological information. The expected result is Negative.  Fact Sheet for Patients:  BloggerCourse.com  Fact Sheet for Healthcare Providers:  SeriousBroker.it  This test is no t yet approved or cleared by the [place] FDA and  has been authorized for detection and/or diagnosis of SARS-CoV-2 by FDA under an Emergency Use Authorization (EUA). This EUA will remain  in effect (meaning this test can be used) for the duration of the COVID-19 declaration  under Section 564(b)(1) of the Act, 21 U.S.C.section 360bbb-3(b)(1), unless the authorization is terminated  or revoked sooner.       Influenza A by PCR NEGATIVE NEGATIVE Final   Influenza B by PCR NEGATIVE NEGATIVE Final    Comment: (NOTE) The Xpert Xpress SARS-CoV-2/FLU/RSV plus assay is intended as an aid in the diagnosis of influenza from Nasopharyngeal swab specimens and should not be used as a sole basis for treatment. Nasal washings and aspirates are unacceptable for Xpert Xpress SARS-CoV-2/FLU/RSV testing.  Fact Sheet for Patients: BloggerCourse.com  Fact Sheet for Healthcare Providers: SeriousBroker.it  This test is not yet approved or cleared by the [place] FDA and has been authorized for detection and/or diagnosis of SARS-CoV-2 by FDA under an Emergency Use Authorization (EUA). This EUA will remain in effect (meaning this test can be used) for the duration of the COVID-19 declaration under Section 564(b)(1) of the Act, 21 U.S.C. section 360bbb-3(b)(1), unless the authorization is terminated or revoked.     Resp Syncytial Virus by PCR NEGATIVE NEGATIVE Final    Comment: (NOTE) Fact Sheet for Patients: BloggerCourse.com  Fact Sheet for Healthcare Providers: SeriousBroker.it  This test is not yet approved or cleared by the [place] FDA and has been authorized for detection and/or diagnosis of SARS-CoV-2 by FDA under an Emergency Use Authorization (EUA). This EUA will remain in effect (meaning this test can be used) for the duration of the COVID-19 declaration under Section 564(b)(1) of the Act, 21 U.S.C. section 360bbb-3(b)(1), unless the authorization is terminated or revoked.  Performed at [facility], [address] [place], [place] [address]          Radiology Studies: EEG adult  Result Date: 20-Mar-2023 Kerry Quest, MD     03-20-2023   4:41 PM Patient Name: Kerry Carlson MRN: [id number] Epilepsy Attending: Charlsie Carlson Referring Physician/Provider: Vassie Loll, MD Date: March 20, 2023 Duration: 23.43 mins Patient history: 89yo F with ams getting eeg to evaluate for seizure Level of alertness: Awake AEDs during EEG study: None Technical aspects: This EEG study was done with scalp electrodes positioned according to the 10-20 International system of electrode placement. Electrical activity was reviewed with band pass filter of 1-70Hz , sensitivity of 7 uV/mm, display speed of 59mm/sec with a 60Hz  notched filter applied as appropriate. EEG data were recorded continuously and digitally stored.  Video monitoring was available and reviewed as appropriate. Description: The posterior dominant rhythm consists of 8 Hz activity  of moderate voltage (25-35 uV) seen predominantly in posterior head regions, symmetric and reactive to eye opening and eye closing. EEG showed intermittent generalized 3 to 6 Hz theta-delta slowing. Hyperventilation and photic stimulation were not performed.   ABNORMALITY - Intermittent slow, generalized IMPRESSION: This study is suggestive of mild diffuse encephalopathy. No seizures or epileptiform discharges were seen throughout the recording. Kerry Carlson   DG Chest Port 1 View  Result Date: 02/18/2023 CLINICAL DATA:  Sepsis EXAM: PORTABLE CHEST 1 VIEW COMPARISON:  05/01/2022 FINDINGS: Left-sided implanted cardiac device remains in place. Stable cardiomegaly. Aortic atherosclerosis. No focal airspace consolidation, pleural effusion, or pneumothorax. IMPRESSION: No active disease. Electronically Signed   By: Duanne Guess D.O.   On: 02/18/2023 21:04   CT Head Wo Contrast  Result Date: 02/18/2023 CLINICAL DATA:  Head trauma EXAM: CT HEAD WITHOUT CONTRAST TECHNIQUE: Contiguous axial images were obtained from the base of the skull through the vertex without intravenous contrast. RADIATION DOSE REDUCTION: This exam  was performed according to the departmental dose-optimization program which includes automated exposure control, adjustment of the mA and/or kV according to patient size and/or use of iterative reconstruction technique. COMPARISON:  None Available. FINDINGS: Brain: There is no mass, hemorrhage or extra-axial collection. There is generalized atrophy without lobar predilection. Hypodensity of the white matter is most commonly associated with chronic microvascular disease. Vascular: Atherosclerotic calcification of the vertebral and internal carotid arteries at the skull base. No abnormal hyperdensity of the major intracranial arteries or dural venous sinuses. Skull: The visualized skull base, calvarium and extracranial soft tissues are normal. Sinuses/Orbits: No fluid levels or advanced mucosal thickening of the visualized paranasal sinuses. No mastoid or middle ear effusion. Normal orbits. IMPRESSION: 1. No acute intracranial abnormality. 2. Generalized atrophy and findings of chronic microvascular disease. Electronically Signed   By: Deatra Robinson M.D.   On: 02/18/2023 20:38        Scheduled Meds:  atenolol  100 mg Oral BID   famotidine  20 mg Oral Daily   influenza vaccine adjuvanted  0.5 mL Intramuscular Tomorrow-1000   levothyroxine  100 mcg Oral Q0600   memantine  10 mg Oral BID   pravastatin  10 mg Oral Daily   Rivaroxaban  15 mg Oral Q supper     LOS: 0 days    Time spent: 55 minutes    Raiana Pharris Hoover Brunette, DO [facility]  If 7PM-7AM, please contact night-coverage [email or web address] 02/20/2023, 8:03 AM

## 2023-02-20 NOTE — TOC Initial Note (Signed)
Transition of Care [facility]) - Initial/Assessment Note    Patient Details  Name: Kerry Carlson MRN: [id number] Date of Birth: 09-15-1925  Transition of Care [facility]) CM/SW Contact:    Karn Cassis, LCSW Phone Number: 02/20/2023, 8:17 AM  Clinical Narrative: Pt admitted due to altered mental status. PT evaluated pt yesterday and recommend SNF. LCSW discussed with pt's son and daughter-in-law and pt oriented x2 per chart. Discussed that pt is currently in OBS and does not have qualifying 3 midnight stay for SNF. Pt does not have long term care policy. Son and daughter-in-law report they are with pt around the clock and will take her home. Agreeable to home health and request [facility]. [name] with [facility] accepts for HHPT, OT, RN, and aide. PT recommends walker which pt has.                    Expected Discharge Plan: Home w Home Health Services Barriers to Discharge: Continued Medical Work up   Patient Goals and CMS Choice Patient states their goals for this hospitalization and ongoing recovery are:: would like SNF, but does not have 3 day stay   Choice offered to / list presented to : Adult Children [place] ownership interest in [facility].provided to::  (n/a)    Expected Discharge Plan and Services In-house Referral: Clinical Social Work   Post Acute Care Choice: Home Health Living arrangements for the past 2 months: Single Family Home                           HH Arranged: RN, PT, OT, Nurse's Aide HH Agency: [facility] Date [facility] Agency Contacted: 02/20/23 Time HH Agency Contacted: [id number] Representative spoke with at [facility] Agency: [name]  Prior Living Arrangements/Services Living arrangements for the past 2 months: Single Family Home Lives with:: Adult Children Patient language and need for interpreter reviewed:: Yes Do you feel safe going back to the place where you live?: Yes      Need for Family Participation in Patient Care: Yes  (Comment) Care giver support system in place?: Yes (comment) Current home services: DME (walker, shower chair) Criminal Activity/Legal Involvement Pertinent to Current Situation/Hospitalization: No - Comment as needed  Activities of Daily Living   ADL Screening (condition at time of admission) Independently performs ADLs?: No Does the patient have a NEW difficulty with bathing/dressing/toileting/self-feeding that is expected to last >3 days?: Yes (Initiates electronic notice to provider for possible OT consult) Does the patient have a NEW difficulty with getting in/out of bed, walking, or climbing stairs that is expected to last >3 days?: Yes (Initiates electronic notice to provider for possible PT consult) Does the patient have a NEW difficulty with communication that is expected to last >3 days?: No Is the patient deaf or have difficulty hearing?: Yes Does the patient have difficulty seeing, even when wearing glasses/contacts?: No Does the patient have difficulty concentrating, remembering, or making decisions?: Yes  Permission Sought/Granted                  Emotional Assessment       Orientation: : Oriented to Self, Oriented to Place Alcohol / Substance Use: Not Applicable Psych Involvement: No (comment)  Admission diagnosis:  Altered mental status [R41.82] Encephalopathy, unspecified type [G93.40] Patient Active Problem List   Diagnosis Date Noted   GERD (gastroesophageal reflux disease) 02/19/2023   Dementia (HCC) 02/19/2023   Paroxysmal atrial fibrillation (HCC) 02/19/2023  Altered mental status 02/18/2023   AKI (acute kidney injury) (HCC) 04/22/2021   Closed right hip fracture, initial encounter (HCC) 04/21/2021   Diabetes mellitus without complication (HCC) 04/21/2021   Hyperlipidemia 04/21/2021   Hypertension 04/21/2021   Thyroid disease 04/21/2021   DNR (do not resuscitate) 04/21/2021   Acute lower UTI 04/21/2021   Encounter for care of pacemaker 05/23/2020    Pacemaker - Medtronic Azure single chamber pacemaker 07/12/2016. 05/23/2020   Second degree Mobitz II AV block 05/23/2020   Permanent atrial fibrillation (HCC) 05/23/2020   PCP:  Gaspar Garbe, MD Pharmacy:   [facility] [phone] - [place], [place] - [address] [place] [address] Phone: [phone] Fax: [phone]     Social Determinants of Health (SDOH) Social History: SDOH Screenings   Food Insecurity: No Food Insecurity (02/19/2023)  Housing: Low Risk  (02/19/2023)  Transportation Needs: No Transportation Needs (02/19/2023)  Utilities: Not At Risk (02/19/2023)  Tobacco Use: Low Risk  (02/18/2023)   SDOH Interventions:     Readmission Risk Interventions     No data to display

## 2023-02-20 NOTE — Care Management Obs Status (Signed)
MEDICARE OBSERVATION STATUS NOTIFICATION   Patient Details  Name: Kerry Carlson MRN: [id number] Date of Birth: 1925/09/28   Medicare Observation Status Notification Given:  Yes    Lekeisha Arenas Feliz Beam 02/20/2023, 11:13 AM

## 2023-02-20 NOTE — Plan of Care (Signed)

## 2023-02-21 ENCOUNTER — Observation Stay (HOSPITAL_COMMUNITY): Payer: Medicare Other

## 2023-02-21 DIAGNOSIS — R4182 Altered mental status, unspecified: Secondary | ICD-10-CM | POA: Diagnosis not present

## 2023-02-21 DIAGNOSIS — G9340 Encephalopathy, unspecified: Secondary | ICD-10-CM | POA: Diagnosis not present

## 2023-02-21 LAB — BASIC METABOLIC PANEL
Anion gap: 9 (ref 5–15)
BUN: 14 mg/dL (ref 8–23)
CO2: 24 mmol/L (ref 22–32)
Calcium: 8.7 mg/dL — ABNORMAL LOW (ref 8.9–10.3)
Chloride: 101 mmol/L (ref 98–111)
Creatinine, Ser: 1.06 mg/dL — ABNORMAL HIGH (ref 0.44–1.00)
GFR, Estimated: 49 mL/min — ABNORMAL LOW (ref >60)
Glucose, Bld: 109 mg/dL — ABNORMAL HIGH (ref 70–99)
Potassium: 3.7 mmol/L (ref 3.5–5.1)
Sodium: 134 mmol/L — ABNORMAL LOW (ref 135–145)

## 2023-02-21 LAB — MAGNESIUM: Magnesium: 1.9 mg/dL (ref 1.7–2.4)

## 2023-02-21 LAB — CBC
HCT: 36.7 % (ref 36.0–46.0)
Hemoglobin: 12.1 g/dL (ref 12.0–15.0)
MCH: 31.1 pg (ref 26.0–34.0)
MCHC: 33 g/dL (ref 30.0–36.0)
MCV: 94.3 fL (ref 80.0–100.0)
Platelets: 210 10*3/uL (ref 150–400)
RBC: 3.89 MIL/uL (ref 3.87–5.11)
RDW: 12.9 % (ref 11.5–15.5)
WBC: 7.1 10*3/uL (ref 4.0–10.5)
nRBC: 0 % (ref 0.0–0.2)

## 2023-02-21 NOTE — NC FL2 (Signed)
[place] MEDICAID FL2 LEVEL OF CARE FORM     IDENTIFICATION  Patient Name: Kerry Carlson Birthdate: October 23, 1925 Sex: female Admission Date (Current Location): 02/18/2023  [facility] and [place] Number:  [employer] and Address:  [facility],  [address] [place] [address]      Provider Number: [id number]  Attending Physician Name and Address:  Erick Blinks, DO  Relative Name and Phone Number:       Current Level of Care: Hospital Recommended Level of Care: Skilled Nursing Facility Prior Approval Number:    Date Approved/Denied:   PASRR Number: [id number]  Discharge Plan: SNF    Current Diagnoses: Patient Active Problem List   Diagnosis Date Noted   GERD (gastroesophageal reflux disease) 02/19/2023   Dementia (HCC) 02/19/2023   Paroxysmal atrial fibrillation (HCC) 02/19/2023   Altered mental status 02/18/2023   AKI (acute kidney injury) (HCC) 04/22/2021   Closed right hip fracture, initial encounter (HCC) 04/21/2021   Diabetes mellitus without complication (HCC) 04/21/2021   Hyperlipidemia 04/21/2021   Hypertension 04/21/2021   Thyroid disease 04/21/2021   DNR (do not resuscitate) 04/21/2021   Acute lower UTI 04/21/2021   Encounter for care of pacemaker 05/23/2020   Pacemaker - Medtronic Azure single chamber pacemaker 07/12/2016. 05/23/2020   Second degree Mobitz II AV block 05/23/2020   Permanent atrial fibrillation (HCC) 05/23/2020    Orientation RESPIRATION BLADDER Height & Weight     Self, Place  Normal Continent Weight: 160 lb 0.9 oz (72.6 kg) Height:     BEHAVIORAL SYMPTOMS/MOOD NEUROLOGICAL BOWEL NUTRITION STATUS      Continent Diet (see dc summary)  AMBULATORY STATUS COMMUNICATION OF NEEDS Skin   Limited Assist Verbally Normal                       Personal Care Assistance Level of Assistance  Bathing, Feeding, Dressing Bathing Assistance: Limited assistance Feeding assistance: Independent Dressing Assistance:  Limited assistance     Functional Limitations Info  Sight, Hearing, Speech Sight Info: Adequate Hearing Info: Impaired Speech Info: Adequate    SPECIAL CARE FACTORS FREQUENCY  PT (By licensed PT), OT (By licensed OT)     PT Frequency: 2-3 x week OT Frequency: 2x week            Contractures Contractures Info: Not present    Additional Factors Info  Code Status, Allergies Code Status Info: DNR Allergies Info: Aspirin, Memantine, Nitrofurantoin, Sulfamethoxazole-trimethoprim, Naproxen, Penicillins           Current Medications (02/21/2023):  This is the current hospital active medication list Current Facility-Administered Medications  Medication Dose Route Frequency Provider Last Rate Last Admin   acetaminophen (TYLENOL) tablet 650 mg  650 mg Oral Q6H PRN Zierle-Ghosh, Asia B, DO       Or   acetaminophen (TYLENOL) suppository 650 mg  650 mg Rectal Q6H PRN Zierle-Ghosh, Asia B, DO       atenolol (TENORMIN) tablet 100 mg  100 mg Oral BID Zierle-Ghosh, Asia B, DO   100 mg at 02/21/23 0837   dextrose 5 % in lactated ringers infusion   Intravenous Continuous Sherryll Burger, Pratik D, DO   Held at 02/20/23 1232   famotidine (PEPCID) tablet 20 mg  20 mg Oral Daily Zierle-Ghosh, Asia B, DO   20 mg at 02/21/23 0837   levothyroxine (SYNTHROID) tablet 100 mcg  100 mcg Oral Q0600 Zierle-Ghosh, Asia B, DO   100 mcg at 02/21/23 [phone]  memantine (NAMENDA) tablet 10 mg  10 mg Oral BID Zierle-Ghosh, Asia B, DO   10 mg at 02/21/23 0837   ondansetron (ZOFRAN) tablet 4 mg  4 mg Oral Q6H PRN Zierle-Ghosh, Asia B, DO       Or   ondansetron (ZOFRAN) injection 4 mg  4 mg Intravenous Q6H PRN Zierle-Ghosh, Asia B, DO       Oral care mouth rinse  15 mL Mouth Rinse PRN Zierle-Ghosh, Asia B, DO       oxyCODONE (Oxy IR/ROXICODONE) immediate release tablet 5 mg  5 mg Oral Q4H PRN Zierle-Ghosh, Asia B, DO       pravastatin (PRAVACHOL) tablet 10 mg  10 mg Oral Daily Zierle-Ghosh, Asia B, DO   10 mg at 02/21/23  1610     Discharge Medications: Please see discharge summary for a list of discharge medications.  Relevant Imaging Results:  Relevant Lab Results:   Additional Information SSN: [id number], LCSW

## 2023-02-21 NOTE — Evaluation (Signed)
Occupational Therapy Evaluation Patient Details Name: Kerry Carlson MRN: [id number] DOB: 05/12/1925 Today's Date: 02/21/2023   History of Present Illness Kerry Carlson is a 89 y.o. female with medical history significant of atrial fibrillation, diet-controlled diabetes, hyperlipidemia, hypertension, pacemaker placement, thyroid disease, and more presents the ED with a chief complaint of altered mental status.  It is reported the patient is usually independent with her own ADLs.  Over the last week she has been quite somnolent.  They thought she had a UTI at urgent care and she was treated empirically with Bactrim.  She has had no improvement in her somnolence.  Patient did have a fall over the weekend.  It was a mechanical fall she did not hit her head.  She has not been complaining of any injuries.  She is on a blood thinner.  CT head did not show any intracranial abnormality.  Chest x-ray was without acute abnormality.  At the time of my exam patient is quite somnolent.  She opens her eyes to touch but does not respond to my questions.  No further history could be obtained at this time.   Clinical Impression   Pt agreeable to OT evaluation. Pt pleasant and followed commands well. Supervision to CGA for seated ADL's. Mostly CGA for mobility to toilet and chair with RW. Supervision assist for bed mobility. Pt is also generally weak in B UE but functionally doing fairly well. Pt was left in the chair with chair alarm set. Pt will benefit from continued OT in the hospital and recommended venue below to increase strength, balance, and endurance for safe ADL's.          If plan is discharge home, recommend the following: A little help with walking and/or transfers;A little help with bathing/dressing/bathroom;Assist for transportation;Assistance with cooking/housework;Help with stairs or ramp for entrance    Functional Status Assessment  Patient has had a recent decline in their functional status  and demonstrates the ability to make significant improvements in function in a reasonable and predictable amount of time.  Equipment Recommendations  None recommended by OT           Precautions / Restrictions Precautions Precautions: Fall Restrictions Weight Bearing Restrictions: No      Mobility Bed Mobility Overal bed mobility: Needs Assistance Bed Mobility: Supine to Sit     Supine to sit: Supervision     General bed mobility comments: no physical assist needed; supervision for safety.    Transfers Overall transfer level: Needs assistance Equipment used: Rolling walker (2 wheels) Transfers: Sit to/from Stand, Bed to chair/wheelchair/BSC Sit to Stand: Contact guard assist, Min assist     Step pivot transfers: Contact guard assist     General transfer comment: Very mild assist to stand once from bed. CGA in other attempts. Labored movement with RW.      Balance Overall balance assessment: Needs assistance Sitting-balance support: Feet supported, No upper extremity supported Sitting balance-Leahy Scale: Fair Sitting balance - Comments: fair/good seated at EOB   Standing balance support: During functional activity, Reliant on assistive device for balance, Bilateral upper extremity supported Standing balance-Leahy Scale: Fair Standing balance comment: using RW                           ADL either performed or assessed with clinical judgement   ADL Overall ADL's : Needs assistance/impaired     Grooming: Contact guard assist;Standing       Lower  Body Bathing: Contact guard assist;Sitting/lateral leans;Supervison/ safety   Upper Body Dressing : Set up;Sitting   Lower Body Dressing: Contact guard assist;Sitting/lateral leans;Supervision/safety   Toilet Transfer: Contact guard assist;Ambulation;Rolling walker (2 wheels) Toilet Transfer Details (indicate cue type and reason): Able to ambulate to the toilet and back to bed with RW. Toileting-  Clothing Manipulation and Hygiene: Set up;Contact guard assist;Sitting/lateral lean       Functional mobility during ADLs: Contact guard assist;Rolling walker (2 wheels)       Vision Baseline Vision/History: 1 Wears glasses Ability to See in Adequate Light: 1 Impaired Patient Visual Report: No change from baseline Vision Assessment?: Wears glasses for reading;No apparent visual deficits     Perception Perception: Not tested       Praxis Praxis: Not tested       Pertinent Vitals/Pain Pain Assessment Pain Assessment: No/denies pain     Extremity/Trunk Assessment Upper Extremity Assessment Upper Extremity Assessment: Generalized weakness (3-/5 for shoulder flexion; generally weak otherwise.)   Lower Extremity Assessment Lower Extremity Assessment: Defer to PT evaluation   Cervical / Trunk Assessment Cervical / Trunk Assessment: Kyphotic   Communication Communication Communication: No apparent difficulties   Cognition Arousal: Alert Behavior During Therapy: WFL for tasks assessed/performed Overall Cognitive Status: History of cognitive impairments - at baseline                                                        Home Living Family/patient expects to be discharged to:: Private residence Living Arrangements: Children;Other relatives Available Help at Discharge: Family;Available 24 hours/day Type of Home: House Home Access: Stairs to enter [employer] of Steps: 1 Entrance Stairs-Rails: None Home Layout: Two level;Able to live on main level with bedroom/bathroom;Full bath on main level Alternate Level Stairs-Number of Steps: flight   Bathroom Shower/Tub: Chief Strategy Officer: Standard Bathroom Accessibility: Yes   Home Equipment: Firefighter   Additional Comments: per PT note      Prior Functioning/Environment Prior Level of Function : Needs assist       Physical Assist : ADLs (physical)   ADLs  (physical): IADLs Mobility Comments: household ambulator using RW ADLs Comments: Pt reports independent ADL's with assit for IADL's.        OT Problem List: Decreased strength;Decreased range of motion;Decreased activity tolerance      OT Treatment/Interventions: Self-care/ADL training;Therapeutic exercise;Therapeutic activities;Patient/family education;Balance training    OT Goals(Current goals can be found in the care plan section) Acute Rehab OT Goals Patient Stated Goal: return home OT Goal Formulation: With patient Time For Goal Achievement: 03/07/23 Potential to Achieve Goals: Good  OT Frequency: Min 1X/week                  AM-PAC OT "6 Clicks" Daily Activity     Outcome Measure Help from another person eating meals?: None Help from another person taking care of personal grooming?: A Little Help from another person toileting, which includes using toliet, bedpan, or urinal?: A Little Help from another person bathing (including washing, rinsing, drying)?: A Little Help from another person to put on and taking off regular upper body clothing?: A Little Help from another person to put on and taking off regular lower body clothing?: A Little 6 Click Score: 19   End of Session Equipment Utilized During  Treatment: Rolling walker (2 wheels);Gait belt Nurse Communication: Other (comment) (notified pt was sitting in the chair.)  Activity Tolerance: Patient tolerated treatment well Patient left: in chair;with call bell/phone within reach;with chair alarm set  OT Visit Diagnosis: Unsteadiness on feet (R26.81);Other abnormalities of gait and mobility (R26.89);Muscle weakness (generalized) (M62.81)                Time: 1610-9604 OT Time Calculation (min): 15 min Charges:  OT General Charges $OT Visit: 1 Visit OT Evaluation $OT Eval Low Complexity: 1 Low  Leahna Hewson OT, MOT   Danie Chandler 02/21/2023, 9:33 AM

## 2023-02-21 NOTE — TOC Transition Note (Signed)
Transition of Care [facility]) - CM/SW Discharge Note   Patient Details  Name: Kerry Carlson MRN: [id number] Date of Birth: 1925/04/26  [facility]) CM/SW Contact:  Elliot Gault, LCSW Phone Number: 02/21/2023, 2:57 PM   Clinical Narrative:     Have discussed dc plan with son and DIL at length. They have chosen to take pt home with [facility]. They have started the process of applying for placement at [facility]. At their request, sent clinical information to admissions at [facility]. Pt will have assessment at home tomorrow.   Updated [name] at [facility] of pt's dc.   No other TOC needs for dc.  Final next level of care: Home w Home Health Services Barriers to Discharge: Barriers Resolved   Patient Goals and CMS Choice CMS Medicare.gov Compare Post Acute Care list provided to:: Patient Represenative (must comment) Choice offered to / list presented to : Adult Children  Discharge Placement                         Discharge Plan and Services Additional resources added to the After Visit Summary for   In-house Referral: Clinical Social Work   Post Acute Care Choice: Home Health                    HH Arranged: RN, PT, OT, Nurse's Aide HH Agency: [facility] Date [facility] Agency Contacted: 02/20/23 Time HH Agency Contacted: [phone] Representative spoke with at [facility] Agency: [name]  Social Determinants of Health (SDOH) Interventions SDOH Screenings   Food Insecurity: No Food Insecurity (02/19/2023)  Housing: Low Risk  (02/19/2023)  Transportation Needs: No Transportation Needs (02/19/2023)  Utilities: Not At Risk (02/19/2023)  Tobacco Use: Low Risk  (02/18/2023)     Readmission Risk Interventions     No data to display

## 2023-02-21 NOTE — Discharge Summary (Signed)
Physician Discharge Summary  LATRELL HASELEY VFI:[id number] DOB: [birth year] DOA: 02/18/2023  PCP: Gaspar Garbe, MD  Admit date: 02/18/2023  Discharge date: 02/21/2023  Admitted From:Home  Disposition:  Home  Recommendations for Outpatient Follow-up:  Follow up with PCP in 1-2 weeks Continue home medications as prior with the exception of Xarelto which has been discontinued on account of high fall risk Likely need to transition to hospice care in the near future  Home Health:Yes, continue palliative services as well  Equipment/Devices:  Discharge Condition:Stable  CODE STATUS: DNR  Diet recommendation: Heart Healthy  Brief/Interim Summary: SUNNIVA CROMAN is a 89 y.o. female with medical history significant of atrial fibrillation, diet-controlled diabetes, hyperlipidemia, hypertension, pacemaker placement, thyroid disease, and more presents the ED with a chief complaint of altered mental status.  She has been admitted with acute encephalopathy with negative workup thus far and appears to have progression of her dementia.  She did not have any other acute findings during her workup this hospitalization.  She unfortunately looks to have a poor prognosis in the long-term and will likely need to be transition to hospice care in the near future.  Discharge Diagnoses:  Principal Problem:   Altered mental status Active Problems:   Hyperlipidemia   Hypertension   Thyroid disease   DNR (do not resuscitate)   GERD (gastroesophageal reflux disease)   Dementia (HCC)   Paroxysmal atrial fibrillation (HCC)  Principal discharge diagnosis: Acute encephalopathy in the setting of progression of dementia.  Discharge Instructions  Discharge Instructions     Diet - low sodium heart healthy   Complete by: As directed    Increase activity slowly   Complete by: As directed       Allergies as of 02/21/2023       Reactions   Aspirin Nausea And Vomiting   Memantine    Other  Reaction(s): was more sedated   Nitrofurantoin Other (See Comments)   Sulfamethoxazole-trimethoprim    Other Reaction(s): increased confusion   Naproxen Hives, Rash   Penicillins Hives, Rash, Other (See Comments)   Had a really long time ago, caused hives        Medication List     STOP taking these medications    Rivaroxaban 15 MG Tabs tablet Commonly known as: XARELTO   sulfamethoxazole-trimethoprim 800-160 MG tablet Commonly known as: BACTRIM DS       TAKE these medications    acetaminophen 500 MG tablet Commonly known as: TYLENOL Take 1,000 mg by mouth 2 (two) times daily.   atenolol 100 MG tablet Commonly known as: TENORMIN Take 1 tablet (100 mg total) by mouth 2 (two) times daily.   famotidine 20 MG tablet Commonly known as: PEPCID Take 20 mg by mouth daily.   levothyroxine 100 MCG tablet Commonly known as: SYNTHROID Take 100 mcg by mouth daily before breakfast.   multivitamin with minerals Tabs tablet Take 1 tablet by mouth daily.   Namenda 10 MG tablet Generic drug: memantine Take 10 mg by mouth 2 (two) times daily.   pravastatin 10 MG tablet Commonly known as: PRAVACHOL Take 10 mg by mouth daily.        Follow-up Information     [facility].   Specialty: Home Health Services Why: Will contact you to schedule home health visits. Contact information: [address] [place] [address] [phone]         Tisovec, Adelfa Koh, MD. Schedule an appointment as soon as  possible for a visit in 1 week(s).   Specialty: Internal Medicine Contact information: [address] [place] [address] [phone]                Allergies  Allergen Reactions   Aspirin Nausea And Vomiting   Memantine     Other Reaction(s): was more sedated   Nitrofurantoin Other (See Comments)   Sulfamethoxazole-Trimethoprim     Other Reaction(s): increased confusion   Naproxen Hives and Rash   Penicillins Hives, Rash  and Other (See Comments)    Had a really long time ago, caused hives    Consultations: None   Procedures/Studies: EEG adult  Result Date: Mar 10, 2023 Charlsie Quest, MD     10-Mar-2023  4:41 PM Patient Name: CHEVONNE DUBEL MRN: [id number] Epilepsy Attending: Charlsie Quest Referring Physician/Provider: Vassie Loll, MD Date: 10-Mar-2023 Duration: 23.43 mins Patient history: 89yo F with ams getting eeg to evaluate for seizure Level of alertness: Awake AEDs during EEG study: None Technical aspects: This EEG study was done with scalp electrodes positioned according to the 10-20 International system of electrode placement. Electrical activity was reviewed with band pass filter of 1-70Hz , sensitivity of 7 uV/mm, display speed of 69mm/sec with a 60Hz  notched filter applied as appropriate. EEG data were recorded continuously and digitally stored.  Video monitoring was available and reviewed as appropriate. Description: The posterior dominant rhythm consists of 8 Hz activity of moderate voltage (25-35 uV) seen predominantly in posterior head regions, symmetric and reactive to eye opening and eye closing. EEG showed intermittent generalized 3 to 6 Hz theta-delta slowing. Hyperventilation and photic stimulation were not performed.   ABNORMALITY - Intermittent slow, generalized IMPRESSION: This study is suggestive of mild diffuse encephalopathy. No seizures or epileptiform discharges were seen throughout the recording. Charlsie Quest   DG Chest Port 1 View  Result Date: 02/18/2023 CLINICAL DATA:  Sepsis EXAM: PORTABLE CHEST 1 VIEW COMPARISON:  05/01/2022 FINDINGS: Left-sided implanted cardiac device remains in place. Stable cardiomegaly. Aortic atherosclerosis. No focal airspace consolidation, pleural effusion, or pneumothorax. IMPRESSION: No active disease. Electronically Signed   By: Duanne Guess D.O.   On: 02/18/2023 21:04   CT Head Wo Contrast  Result Date: 02/18/2023 CLINICAL DATA:  Head  trauma EXAM: CT HEAD WITHOUT CONTRAST TECHNIQUE: Contiguous axial images were obtained from the base of the skull through the vertex without intravenous contrast. RADIATION DOSE REDUCTION: This exam was performed according to the departmental dose-optimization program which includes automated exposure control, adjustment of the mA and/or kV according to patient size and/or use of iterative reconstruction technique. COMPARISON:  None Available. FINDINGS: Brain: There is no mass, hemorrhage or extra-axial collection. There is generalized atrophy without lobar predilection. Hypodensity of the white matter is most commonly associated with chronic microvascular disease. Vascular: Atherosclerotic calcification of the vertebral and internal carotid arteries at the skull base. No abnormal hyperdensity of the major intracranial arteries or dural venous sinuses. Skull: The visualized skull base, calvarium and extracranial soft tissues are normal. Sinuses/Orbits: No fluid levels or advanced mucosal thickening of the visualized paranasal sinuses. No mastoid or middle ear effusion. Normal orbits. IMPRESSION: 1. No acute intracranial abnormality. 2. Generalized atrophy and findings of chronic microvascular disease. Electronically Signed   By: Deatra Robinson M.D.   On: 02/18/2023 20:38     Discharge Exam: Vitals:   02/21/23 0500 02/21/23 1232  BP: (!) 140/76 137/86  Pulse: 72 88  Resp: 18 17  Temp: 98.2 F (36.8 C) 98.3 F (  36.8 C)  SpO2: 96% 97%   Vitals:   02/20/23 1340 02/20/23 2032 02/21/23 0500 02/21/23 1232  BP: 133/78 (!) 147/66 (!) 140/76 137/86  Pulse: 93 76 72 88  Resp:  18 18 17   Temp: 98.2 F (36.8 C) (!) 97.5 F (36.4 C) 98.2 F (36.8 C) 98.3 F (36.8 C)  TempSrc: Oral Oral Oral Oral  SpO2: 98% 98% 96% 97%  Weight:        General: Pt is alert, awake, not in acute distress Cardiovascular: RRR, S1/S2 +, no rubs, no gallops Respiratory: CTA bilaterally, no wheezing, no rhonchi Abdominal:  Soft, NT, ND, bowel sounds + Extremities: no edema, no cyanosis    The results of significant diagnostics from this hospitalization (including imaging, microbiology, ancillary and laboratory) are listed below for reference.     Microbiology: Recent Results (from the past 240 hour(s))  Blood Culture (routine x 2)     Status: None (Preliminary result)   Collection Time: 02/18/23  5:28 PM   Specimen: BLOOD  Result Value Ref Range Status   Specimen Description   Final    BLOOD BLOOD LEFT ARM Performed at [facility], [address] [place], [place] [address]    Special Requests   Final    BOTTLES DRAWN AEROBIC AND ANAEROBIC Blood Culture results may not be optimal due to an excessive volume of blood received in culture bottles Performed at [facility], [address] [place], [place] [address]    Culture   Final    NO GROWTH 3 DAYS Performed at [facility], [address], [place], [place] [address]    Report Status PENDING  Incomplete  Blood Culture (routine x 2)     Status: None (Preliminary result)   Collection Time: 02/18/23  5:36 PM   Specimen: BLOOD  Result Value Ref Range Status   Specimen Description   Final    BLOOD BLOOD LEFT HAND Performed at [facility], [address] [place], [place] [address]    Special Requests   Final    BOTTLES DRAWN AEROBIC ONLY Blood Culture adequate volume Performed at [facility], [address] [place], [place] [address]    Culture   Final    NO GROWTH 3 DAYS Performed at [facility], [address] [place], [place] [address]    Report Status PENDING  Incomplete  Resp panel by RT-PCR (RSV, Flu A&B, Covid) Anterior Nasal Swab     Status: None   Collection Time: 02/18/23  5:45 PM   Specimen: Anterior Nasal Swab  Result Value Ref Range Status   SARS Coronavirus 2 by RT PCR NEGATIVE NEGATIVE Final    Comment: (NOTE) SARS-CoV-2 target nucleic acids are NOT DETECTED.  The SARS-CoV-2 RNA is generally detectable in  upper respiratory specimens during the acute phase of infection. The lowest concentration of SARS-CoV-2 viral copies this assay can detect is 138 copies/mL. A negative result does not preclude SARS-Cov-2 infection and should not be used as the sole basis for treatment or other patient management decisions. A negative result may occur with  improper specimen collection/handling, submission of specimen other than nasopharyngeal swab, presence of viral mutation(s) within the areas targeted by this assay, and inadequate number of viral copies(<138 copies/mL). A negative result must be combined with clinical observations, patient history, and epidemiological information. The expected result is Negative.  Fact Sheet for Patients:  BloggerCourse.com  Fact Sheet for Healthcare Providers:  SeriousBroker.it  This test is no  t yet approved or cleared by the [place] and  has been authorized for detection and/or diagnosis of SARS-CoV-2 by FDA under an Emergency Use Authorization (EUA). This EUA will remain  in effect (meaning this test can be used) for the duration of the COVID-19 declaration under Section 564(b)(1) of the Act, 21 U.S.C.section 360bbb-3(b)(1), unless the authorization is terminated  or revoked sooner.       Influenza A by PCR NEGATIVE NEGATIVE Final   Influenza B by PCR NEGATIVE NEGATIVE Final    Comment: (NOTE) The Xpert Xpress SARS-CoV-2/FLU/RSV plus assay is intended as an aid in the diagnosis of influenza from Nasopharyngeal swab specimens and should not be used as a sole basis for treatment. Nasal washings and aspirates are unacceptable for Xpert Xpress SARS-CoV-2/FLU/RSV testing.  Fact Sheet for Patients: BloggerCourse.com  Fact Sheet for Healthcare Providers: SeriousBroker.it  This test is not yet approved or cleared by the [place] FDA and has been  authorized for detection and/or diagnosis of SARS-CoV-2 by FDA under an Emergency Use Authorization (EUA). This EUA will remain in effect (meaning this test can be used) for the duration of the COVID-19 declaration under Section 564(b)(1) of the Act, 21 U.S.C. section 360bbb-3(b)(1), unless the authorization is terminated or revoked.     Resp Syncytial Virus by PCR NEGATIVE NEGATIVE Final    Comment: (NOTE) Fact Sheet for Patients: BloggerCourse.com  Fact Sheet for Healthcare Providers: SeriousBroker.it  This test is not yet approved or cleared by the [place] FDA and has been authorized for detection and/or diagnosis of SARS-CoV-2 by FDA under an Emergency Use Authorization (EUA). This EUA will remain in effect (meaning this test can be used) for the duration of the COVID-19 declaration under Section 564(b)(1) of the Act, 21 U.S.C. section 360bbb-3(b)(1), unless the authorization is terminated or revoked.  Performed at [facility], [address] [place], [place] [address]      Labs: BNP (last 3 results) No results for input(s): "BNP" in the last 8760 hours. Basic Metabolic Panel: Recent Labs  Lab 02/18/23 1728 02/19/23 0448 02/20/23 0421 02/21/23 0501  NA 133* 135 135 134*  K 4.5 4.2 3.9 3.7  CL 99 103 102 101  CO2 26 23 26 24   GLUCOSE 113* 74 83 109*  BUN 14 13 14 14   CREATININE 1.28* 1.25* 1.30* 1.06*  CALCIUM 9.1 8.5* 8.6* 8.7*  MG  --  2.1  --  1.9   Liver Function Tests: Recent Labs  Lab 02/18/23 1728 02/19/23 0448  AST 18 14*  ALT 14 12  ALKPHOS 47 35*  BILITOT 0.7 0.7  PROT 7.0 5.4*  ALBUMIN 4.1 3.1*   No results for input(s): "LIPASE", "AMYLASE" in the last 168 hours. Recent Labs  Lab 02/18/23 1728  AMMONIA <10   CBC: Recent Labs  Lab 02/18/23 1728 02/19/23 0448 02/21/23 0501  WBC 7.9 6.9 7.1  NEUTROABS 4.7 3.3  --   HGB 12.8 11.0* 12.1  HCT 40.3 33.5* 36.7  MCV 96.0 95.7 94.3   PLT 236 201 210   Cardiac Enzymes: No results for input(s): "CKTOTAL", "CKMB", "CKMBINDEX", "TROPONINI" in the last 168 hours. BNP: Invalid input(s): "POCBNP" CBG: No results for input(s): "GLUCAP" in the last 168 hours. D-Dimer No results for input(s): "DDIMER" in the last 72 hours. Hgb A1c No results for input(s): "HGBA1C" in the last 72 hours. Lipid Profile No results for input(s): "CHOL", "HDL", "LDLCALC", "TRIG", "CHOLHDL", "LDLDIRECT" in the last 72 hours. Thyroid function studies  Recent Labs    02/18/23 1728  TSH 2.255   Anemia work up Recent Labs    02/18/23 1955  VITAMINB12 288  FOLATE >40.0   Urinalysis    Component Value Date/Time   COLORURINE YELLOW 02/18/2023 2225   APPEARANCEUR CLEAR 02/18/2023 2225   LABSPEC 1.009 02/18/2023 2225   PHURINE 6.0 02/18/2023 2225   GLUCOSEU NEGATIVE 02/18/2023 2225   HGBUR NEGATIVE 02/18/2023 2225   BILIRUBINUR NEGATIVE 02/18/2023 2225   KETONESUR 5 (A) 02/18/2023 2225   PROTEINUR NEGATIVE 02/18/2023 2225   NITRITE NEGATIVE 02/18/2023 2225   LEUKOCYTESUR NEGATIVE 02/18/2023 2225   Sepsis Labs Recent Labs  Lab 02/18/23 1728 02/19/23 0448 02/21/23 0501  WBC 7.9 6.9 7.1   Microbiology Recent Results (from the past 240 hour(s))  Blood Culture (routine x 2)     Status: None (Preliminary result)   Collection Time: 02/18/23  5:28 PM   Specimen: BLOOD  Result Value Ref Range Status   Specimen Description   Final    BLOOD BLOOD LEFT ARM Performed at [facility], [address] [place], [place] [address]    Special Requests   Final    BOTTLES DRAWN AEROBIC AND ANAEROBIC Blood Culture results may not be optimal due to an excessive volume of blood received in culture bottles Performed at [facility], [address], [place], [place] [address]    Culture   Final    NO GROWTH 3 DAYS Performed at [facility], [address] [place], [place] [address]    Report Status PENDING  Incomplete  Blood Culture  (routine x 2)     Status: None (Preliminary result)   Collection Time: 02/18/23  5:36 PM   Specimen: BLOOD  Result Value Ref Range Status   Specimen Description   Final    BLOOD BLOOD LEFT HAND Performed at [facility], [address], [place], [place] [address]    Special Requests   Final    BOTTLES DRAWN AEROBIC ONLY Blood Culture adequate volume Performed at [facility], [address], [place], [place] [address]    Culture   Final    NO GROWTH 3 DAYS Performed at [facility], [address] [place], [place] [address]    Report Status PENDING  Incomplete  Resp panel by RT-PCR (RSV, Flu A&B, Covid) Anterior Nasal Swab     Status: None   Collection Time: 02/18/23  5:45 PM   Specimen: Anterior Nasal Swab  Result Value Ref Range Status   SARS Coronavirus 2 by RT PCR NEGATIVE NEGATIVE Final    Comment: (NOTE) SARS-CoV-2 target nucleic acids are NOT DETECTED.  The SARS-CoV-2 RNA is generally detectable in upper respiratory specimens during the acute phase of infection. The lowest concentration of SARS-CoV-2 viral copies this assay can detect is 138 copies/mL. A negative result does not preclude SARS-Cov-2 infection and should not be used as the sole basis for treatment or other patient management decisions. A negative result may occur with  improper specimen collection/handling, submission of specimen other than nasopharyngeal swab, presence of viral mutation(s) within the areas targeted by this assay, and inadequate number of viral copies(<138 copies/mL). A negative result must be combined with clinical observations, patient history, and epidemiological information. The expected result is Negative.  Fact Sheet for Patients:  BloggerCourse.com  Fact Sheet for Healthcare Providers:  SeriousBroker.it  This test is no t yet approved or cleared by the [place] FDA and  has been authorized for detection  and/or  diagnosis of SARS-CoV-2 by FDA under an Emergency Use Authorization (EUA). This EUA will remain  in effect (meaning this test can be used) for the duration of the COVID-19 declaration under Section 564(b)(1) of the Act, 21 U.S.C.section 360bbb-3(b)(1), unless the authorization is terminated  or revoked sooner.       Influenza A by PCR NEGATIVE NEGATIVE Final   Influenza B by PCR NEGATIVE NEGATIVE Final    Comment: (NOTE) The Xpert Xpress SARS-CoV-2/FLU/RSV plus assay is intended as an aid in the diagnosis of influenza from Nasopharyngeal swab specimens and should not be used as a sole basis for treatment. Nasal washings and aspirates are unacceptable for Xpert Xpress SARS-CoV-2/FLU/RSV testing.  Fact Sheet for Patients: BloggerCourse.com  Fact Sheet for Healthcare Providers: SeriousBroker.it  This test is not yet approved or cleared by the [place] FDA and has been authorized for detection and/or diagnosis of SARS-CoV-2 by FDA under an Emergency Use Authorization (EUA). This EUA will remain in effect (meaning this test can be used) for the duration of the COVID-19 declaration under Section 564(b)(1) of the Act, 21 U.S.C. section 360bbb-3(b)(1), unless the authorization is terminated or revoked.     Resp Syncytial Virus by PCR NEGATIVE NEGATIVE Final    Comment: (NOTE) Fact Sheet for Patients: BloggerCourse.com  Fact Sheet for Healthcare Providers: SeriousBroker.it  This test is not yet approved or cleared by the [place] FDA and has been authorized for detection and/or diagnosis of SARS-CoV-2 by FDA under an Emergency Use Authorization (EUA). This EUA will remain in effect (meaning this test can be used) for the duration of the COVID-19 declaration under Section 564(b)(1) of the Act, 21 U.S.C. section 360bbb-3(b)(1), unless the authorization is terminated  or revoked.  Performed at [facility], [address] [place], [place] [address]      Time coordinating discharge: 35 minutes  SIGNED:   Erick Blinks, DO [facility] 02/21/2023, 2:24 PM  If 7PM-7AM, please contact night-coverage [email or web address]

## 2023-02-21 NOTE — Plan of Care (Signed)
  Problem: Acute Rehab OT Goals (only OT should resolve) Goal: Pt. Will Perform Grooming Flowsheets (Taken 02/21/2023 0935) Pt Will Perform Grooming:  with modified independence  standing Goal: Pt. Will Perform Lower Body Dressing Flowsheets (Taken 02/21/2023 0935) Pt Will Perform Lower Body Dressing:  with modified independence  sitting/lateral leans Goal: Pt. Will Transfer To Toilet Flowsheets (Taken 02/21/2023 0935) Pt Will Transfer to Toilet:  with modified independence  ambulating Goal: Pt. Will Perform Toileting-Clothing Manipulation Flowsheets (Taken 02/21/2023 0935) Pt Will Perform Toileting - Clothing Manipulation and hygiene: with modified independence Goal: Pt/Caregiver Will Perform Home Exercise Program Flowsheets (Taken 02/21/2023 0935) Pt/caregiver will Perform Home Exercise Program:  Increased ROM  Increased strength  Both right and left upper extremity  Independently  Carnie Bruemmer OT, MOT

## 2023-02-21 NOTE — TOC Progression Note (Signed)
Transition of Care [facility]) - Progression Note    Patient Details  Name: Kerry Carlson MRN: [id number] Date of Birth: 1925-08-12  [facility]) CM/SW Contact  Elliot Gault, LCSW Phone Number: 02/21/2023, 11:43 AM  Clinical Narrative:     TOC following. Spoke with pt's son and DIL at their request to review dc planning. Explained pt's observation status and no coverage for SNF rehab at dc. Explained HH services that can be arranged. Offered lists for ALF/Memory Care facilities and private duty care agencies.   Discussed private pay at SNF and they would like to see what might be available along with cost.   Referrals sent as requested. Resource information mentioned above provided.  Will follow.  Expected Discharge Plan: Home w Home Health Services Barriers to Discharge: Continued Medical Work up  Expected Discharge Plan and Services In-house Referral: Clinical Social Work   Post Acute Care Choice: Home Health Living arrangements for the past 2 months: Single Family Home                           HH Arranged: RN, PT, OT, Nurse's Aide HH Agency: [facility] Date [facility] Agency Contacted: 02/20/23 Time HH Agency Contacted: [phone] Representative spoke with at [facility] Agency: [name]   Social Determinants of Health (SDOH) Interventions SDOH Screenings   Food Insecurity: No Food Insecurity (02/19/2023)  Housing: Low Risk  (02/19/2023)  Transportation Needs: No Transportation Needs (02/19/2023)  Utilities: Not At Risk (02/19/2023)  Tobacco Use: Low Risk  (02/18/2023)    Readmission Risk Interventions     No data to display

## 2023-02-21 NOTE — [place] FL2 (Signed)
[place] MEDICAID FL2 LEVEL OF CARE FORM     IDENTIFICATION  Patient Name: Kerry Carlson Birthdate: 01/21/1926 Sex: female Admission Date (Current Location): 02/18/2023  [facility] and [place] Number:  [employer] and Address:  [facility],  [address] [place] [address]      Provider Number: [id number]  Attending Physician Name and Address:  Erick Blinks, DO  Relative Name and Phone Number:       Current Level of Care: Hospital Recommended Level of Care: Assisted Living Facility Prior Approval Number:    Date Approved/Denied:   PASRR Number: [id number]  Discharge Plan: Other (Comment) (ALF)    Current Diagnoses: Patient Active Problem List   Diagnosis Date Noted   GERD (gastroesophageal reflux disease) 02/19/2023   Dementia (HCC) 02/19/2023   Paroxysmal atrial fibrillation (HCC) 02/19/2023   Altered mental status 02/18/2023   AKI (acute kidney injury) (HCC) 04/22/2021   Closed right hip fracture, initial encounter (HCC) 04/21/2021   Diabetes mellitus without complication (HCC) 04/21/2021   Hyperlipidemia 04/21/2021   Hypertension 04/21/2021   Thyroid disease 04/21/2021   DNR (do not resuscitate) 04/21/2021   Acute lower UTI 04/21/2021   Encounter for care of pacemaker 05/23/2020   Pacemaker - Medtronic Azure single chamber pacemaker 07/12/2016. 05/23/2020   Second degree Mobitz II AV block 05/23/2020   Permanent atrial fibrillation (HCC) 05/23/2020    Orientation RESPIRATION BLADDER Height & Weight     Self, Place  Normal Continent Weight: 160 lb 0.9 oz (72.6 kg) Height:     BEHAVIORAL SYMPTOMS/MOOD NEUROLOGICAL BOWEL NUTRITION STATUS      Continent Diet: No added salt  AMBULATORY STATUS COMMUNICATION OF NEEDS Skin   Supervision Verbally Normal                       Personal Care Assistance Level of Assistance  Bathing, Feeding, Dressing Bathing Assistance: Limited assistance Feeding assistance: Independent Dressing  Assistance: Limited assistance     Functional Limitations Info  Sight, Hearing, Speech Sight Info: Adequate Hearing Info: Impaired Speech Info: Adequate    SPECIAL CARE FACTORS FREQUENCY                    Contractures Contractures Info: Not present    Additional Factors Info  Code Status, Allergies Code Status Info: DNR Allergies Info: Aspirin, Memantine, Nitrofurantoin, Sulfamethoxazole-trimethoprim, Naproxen, Penicillins           Current Medications (02/21/2023):  This is the current hospital active medication list Current Facility-Administered Medications  Medication Dose Route Frequency Provider Last Rate Last Admin   acetaminophen (TYLENOL) tablet 650 mg  650 mg Oral Q6H PRN Zierle-Ghosh, Asia B, DO       Or   acetaminophen (TYLENOL) suppository 650 mg  650 mg Rectal Q6H PRN Zierle-Ghosh, Asia B, DO       atenolol (TENORMIN) tablet 100 mg  100 mg Oral BID Zierle-Ghosh, Asia B, DO   100 mg at 02/21/23 0837   famotidine (PEPCID) tablet 20 mg  20 mg Oral Daily Zierle-Ghosh, Asia B, DO   20 mg at 02/21/23 0837   levothyroxine (SYNTHROID) tablet 100 mcg  100 mcg Oral Q0600 Zierle-Ghosh, Asia B, DO   100 mcg at 02/21/23 0504   memantine (NAMENDA) tablet 10 mg  10 mg Oral BID Zierle-Ghosh, Asia B, DO   10 mg at 02/21/23 0837   ondansetron (ZOFRAN) tablet 4 mg  4 mg Oral Q6H PRN Zierle-Ghosh,  Asia B, DO       Or   ondansetron (ZOFRAN) injection 4 mg  4 mg Intravenous Q6H PRN Zierle-Ghosh, Asia B, DO       Oral care mouth rinse  15 mL Mouth Rinse PRN Zierle-Ghosh, Asia B, DO       oxyCODONE (Oxy IR/ROXICODONE) immediate release tablet 5 mg  5 mg Oral Q4H PRN Zierle-Ghosh, Asia B, DO       pravastatin (PRAVACHOL) tablet 10 mg  10 mg Oral Daily Zierle-Ghosh, Asia B, DO   10 mg at 02/21/23 1610     Discharge Medications:  STOP taking these medications     Rivaroxaban 15 MG Tabs tablet Commonly known as: XARELTO    sulfamethoxazole-trimethoprim 800-160 MG  tablet Commonly known as: BACTRIM DS           TAKE these medications     acetaminophen 500 MG tablet Commonly known as: TYLENOL Take 1,000 mg by mouth 2 (two) times daily.    atenolol 100 MG tablet Commonly known as: TENORMIN Take 1 tablet (100 mg total) by mouth 2 (two) times daily.    famotidine 20 MG tablet Commonly known as: PEPCID Take 20 mg by mouth daily.    levothyroxine 100 MCG tablet Commonly known as: SYNTHROID Take 100 mcg by mouth daily before breakfast.    multivitamin with minerals Tabs tablet Take 1 tablet by mouth daily.    Namenda 10 MG tablet Generic drug: memantine Take 10 mg by mouth 2 (two) times daily.    pravastatin 10 MG tablet Commonly known as: PRAVACHOL Take 10 mg by mouth daily.           Relevant Imaging Results:  Relevant Lab Results:   Additional Information SSN: [id number] LCSW

## 2023-02-23 LAB — CULTURE, BLOOD (ROUTINE X 2)
Culture: NO GROWTH
Culture: NO GROWTH
Special Requests: ADEQUATE

## 2023-04-10 ENCOUNTER — Ambulatory Visit (INDEPENDENT_AMBULATORY_CARE_PROVIDER_SITE_OTHER): Payer: Medicare Other

## 2023-04-10 DIAGNOSIS — I4821 Permanent atrial fibrillation: Secondary | ICD-10-CM | POA: Diagnosis not present

## 2023-04-10 LAB — CUP PACEART REMOTE DEVICE CHECK
Battery Remaining Longevity: 111 mo
Battery Voltage: 3 V
Brady Statistic RV Percent Paced: 29.67 %
Date Time Interrogation Session: 20241218012930
Lead Channel Impedance Value: 380 Ohm
Lead Channel Impedance Value: 437 Ohm
Lead Channel Pacing Threshold Amplitude: 0.5 V
Lead Channel Pacing Threshold Pulse Width: 0.4 ms
Lead Channel Sensing Intrinsic Amplitude: 11 mV
Lead Channel Sensing Intrinsic Amplitude: 11 mV
Lead Channel Setting Pacing Amplitude: 1.5 V
Lead Channel Setting Pacing Pulse Width: 0.4 ms
Lead Channel Setting Sensing Sensitivity: 4 mV
Zone Setting Status: 755011

## 2023-05-20 NOTE — Progress Notes (Signed)
Remote pacemaker transmission.

## 2023-06-23 IMAGING — CR DG HIP (WITH OR WITHOUT PELVIS) 2-3V*R*
3 series · 3 of 3 positions shown · non-contrast
Comparison: No priors.

CLINICAL DATA: [AGE] female with history of fall complaining
of right-sided hip pain.

EXAM:
DG HIP (WITH OR WITHOUT PELVIS) 2-3V RIGHT

[pelvis ap]
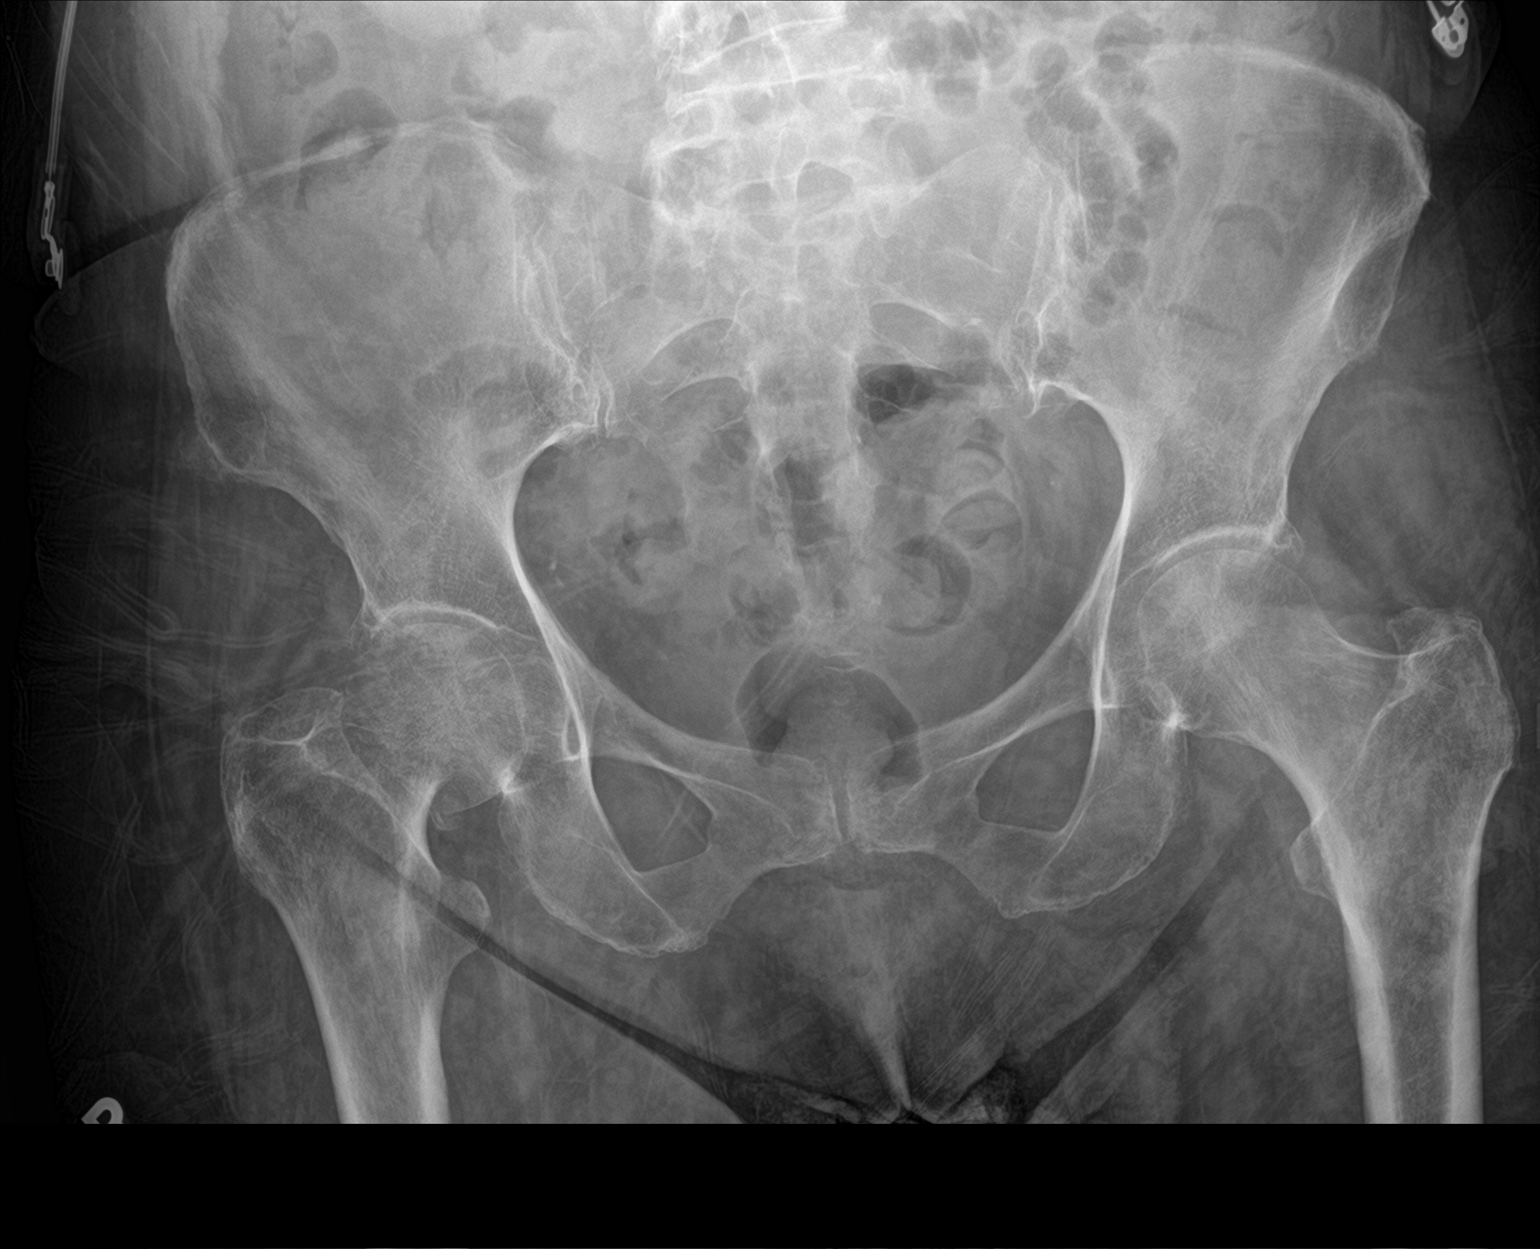

[hip ap]
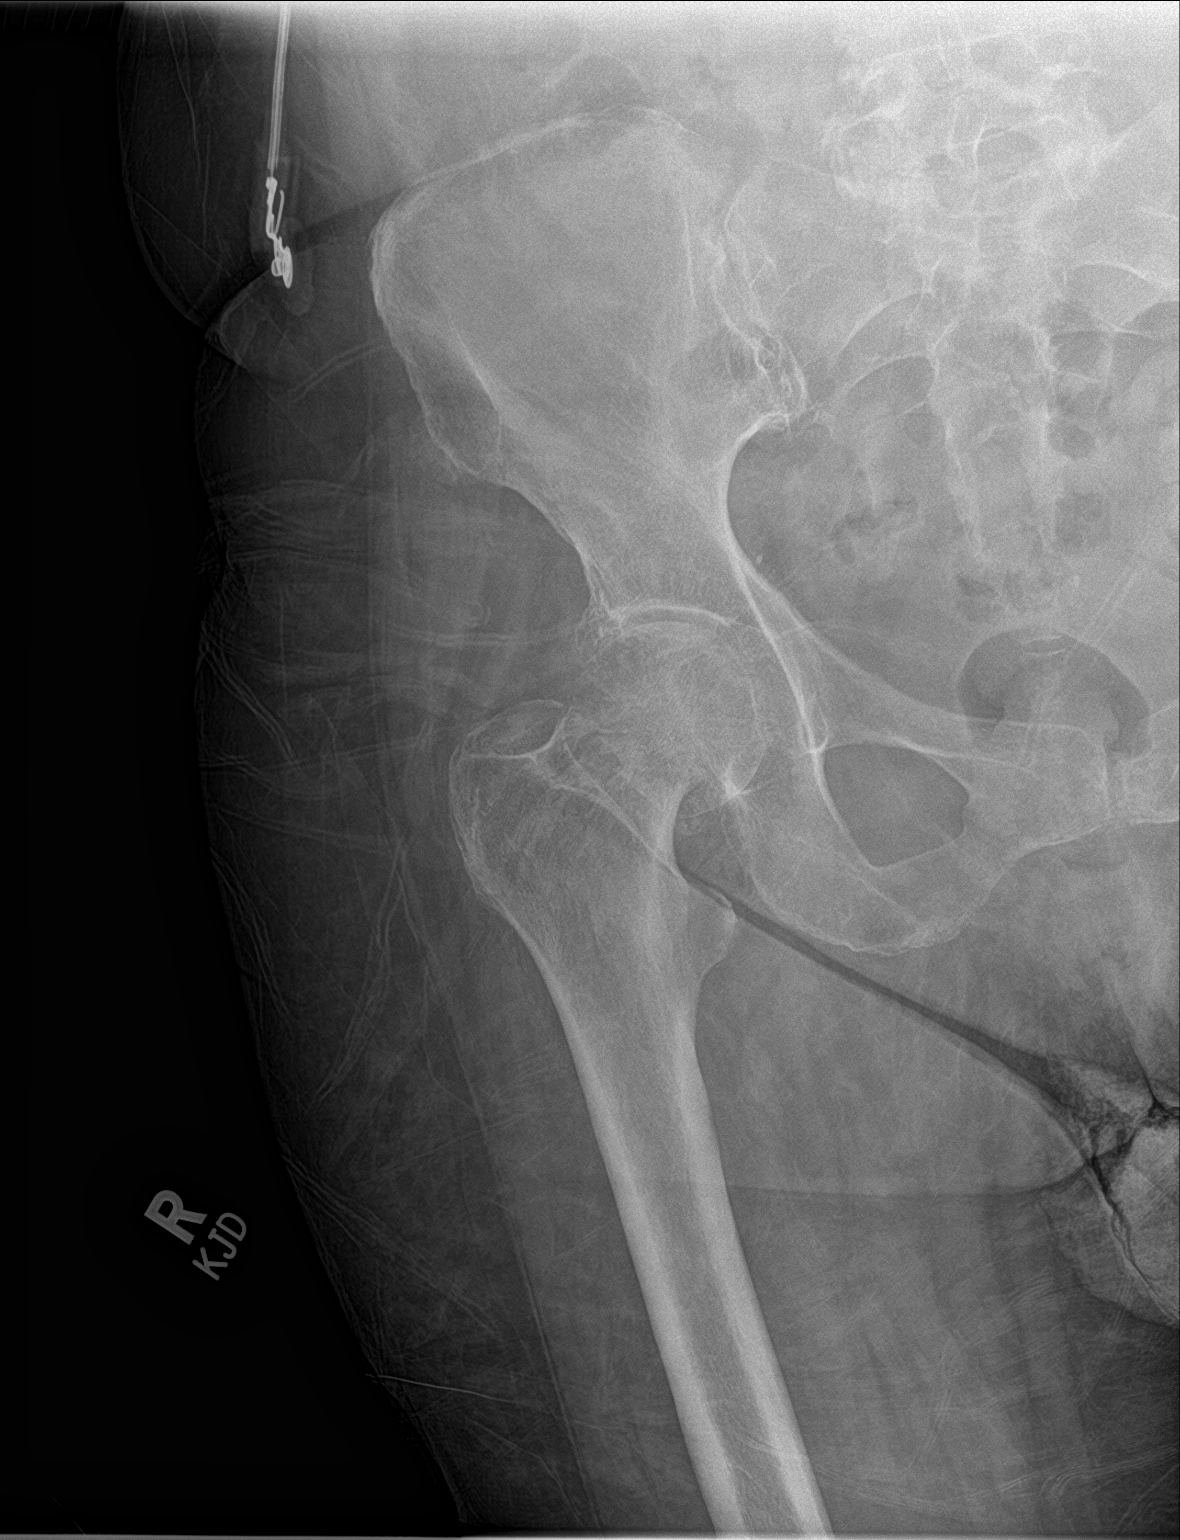

[hip lat]
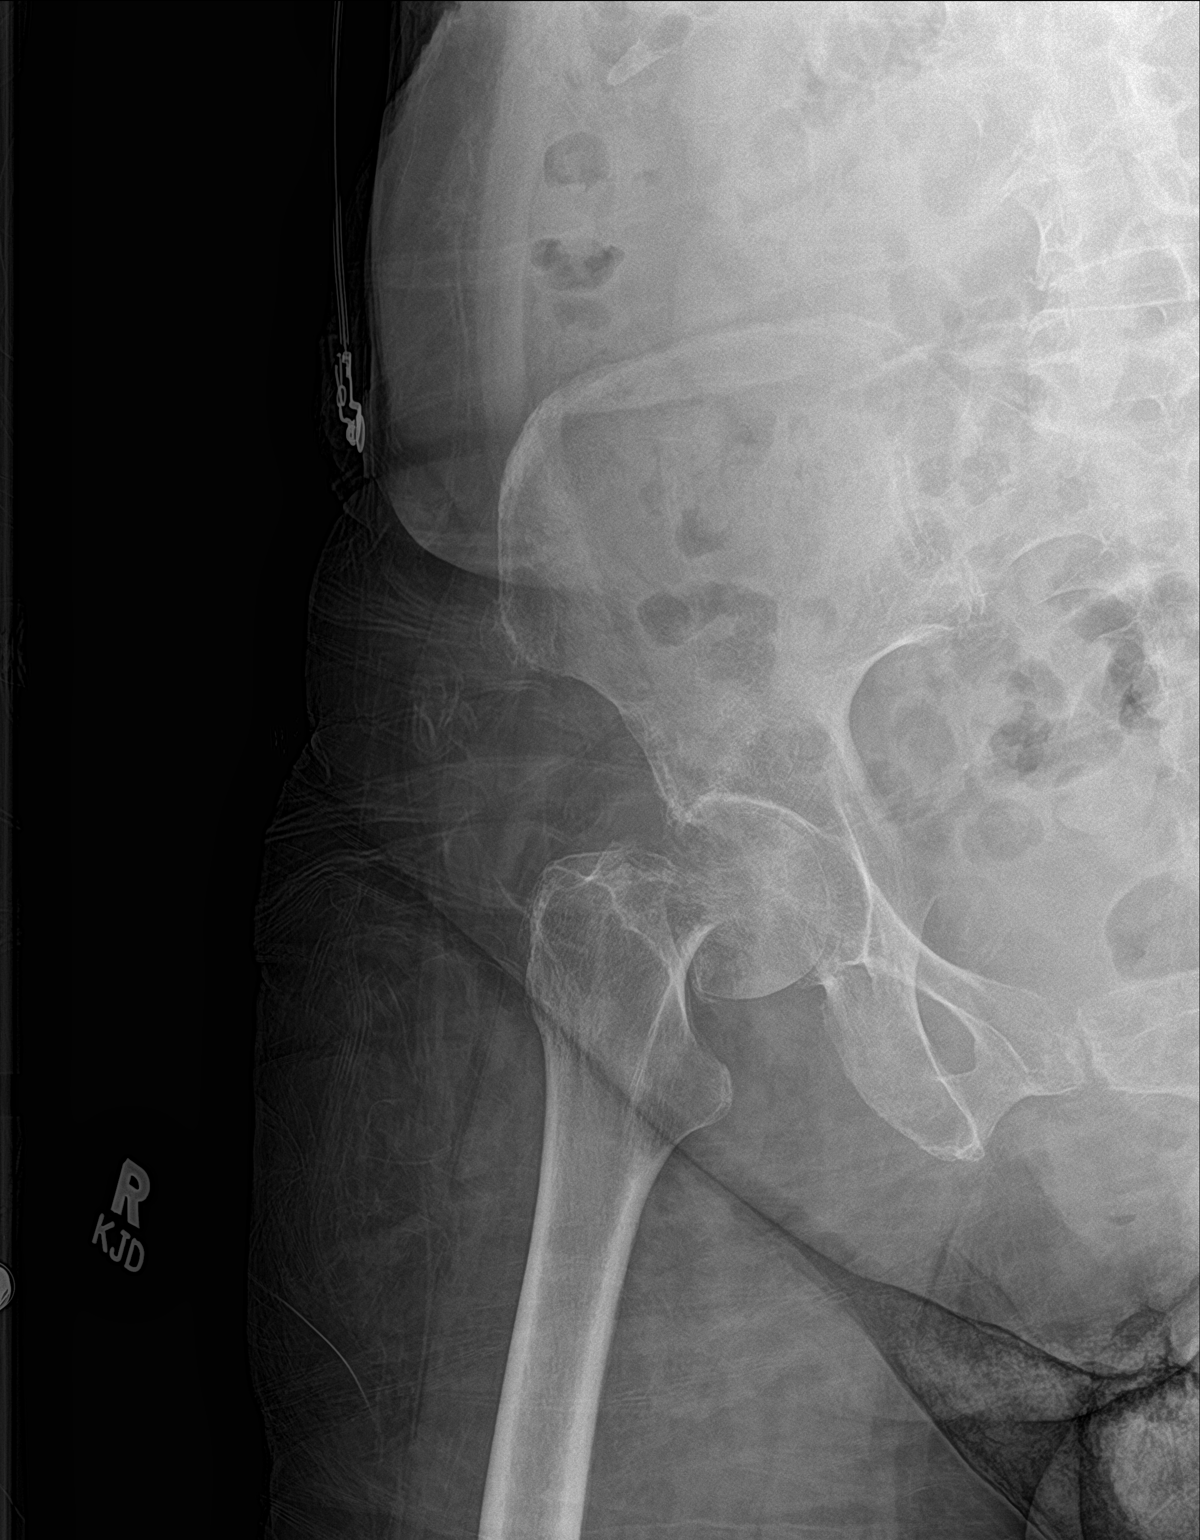

[3 of 3 positions shown; findings below may reference images not displayed]

FINDINGS: AP view of the bony pelvis and AP and lateral view of the right hip
demonstrate a mildly displaced fracture of the right femoral neck
which appears predominantly transcervical. There is approximately
1.4 cm of cephalad migration of the distal femur relative to the
femoral head, likely with some mild impaction. Femoral head remains
located in the right acetabulum. Bony pelvis itself appears intact,
as do the visualized portions of the left proximal femur. There is
joint space narrowing, subchondral sclerosis, subchondral cyst
formation and osteophyte formation in both hip joints, compatible
with moderate osteoarthritis.
IMPRESSION: 1. Mildly displaced transcervical right femoral neck fracture, as
above.
2. Moderate bilateral hip joint osteoarthritis.

## 2023-06-26 IMAGING — RF DG HIP (WITH OR WITHOUT PELVIS) 1V*R*
1 series · 3 of 3 positions shown · non-contrast
Comparison: April 21, 2021.

CLINICAL DATA: Right hip arthroplasty.

EXAM:
DG HIP (WITH OR WITHOUT PELVIS) 1V RIGHT; DG C-ARM 1-60 MIN-NO
REPORT
Radiation exposure index: 1.1542 mGy.

[Series 1: unknown protocol · right · 0.20mm/px · 3 of 3 slices shown]
[im 1/3]
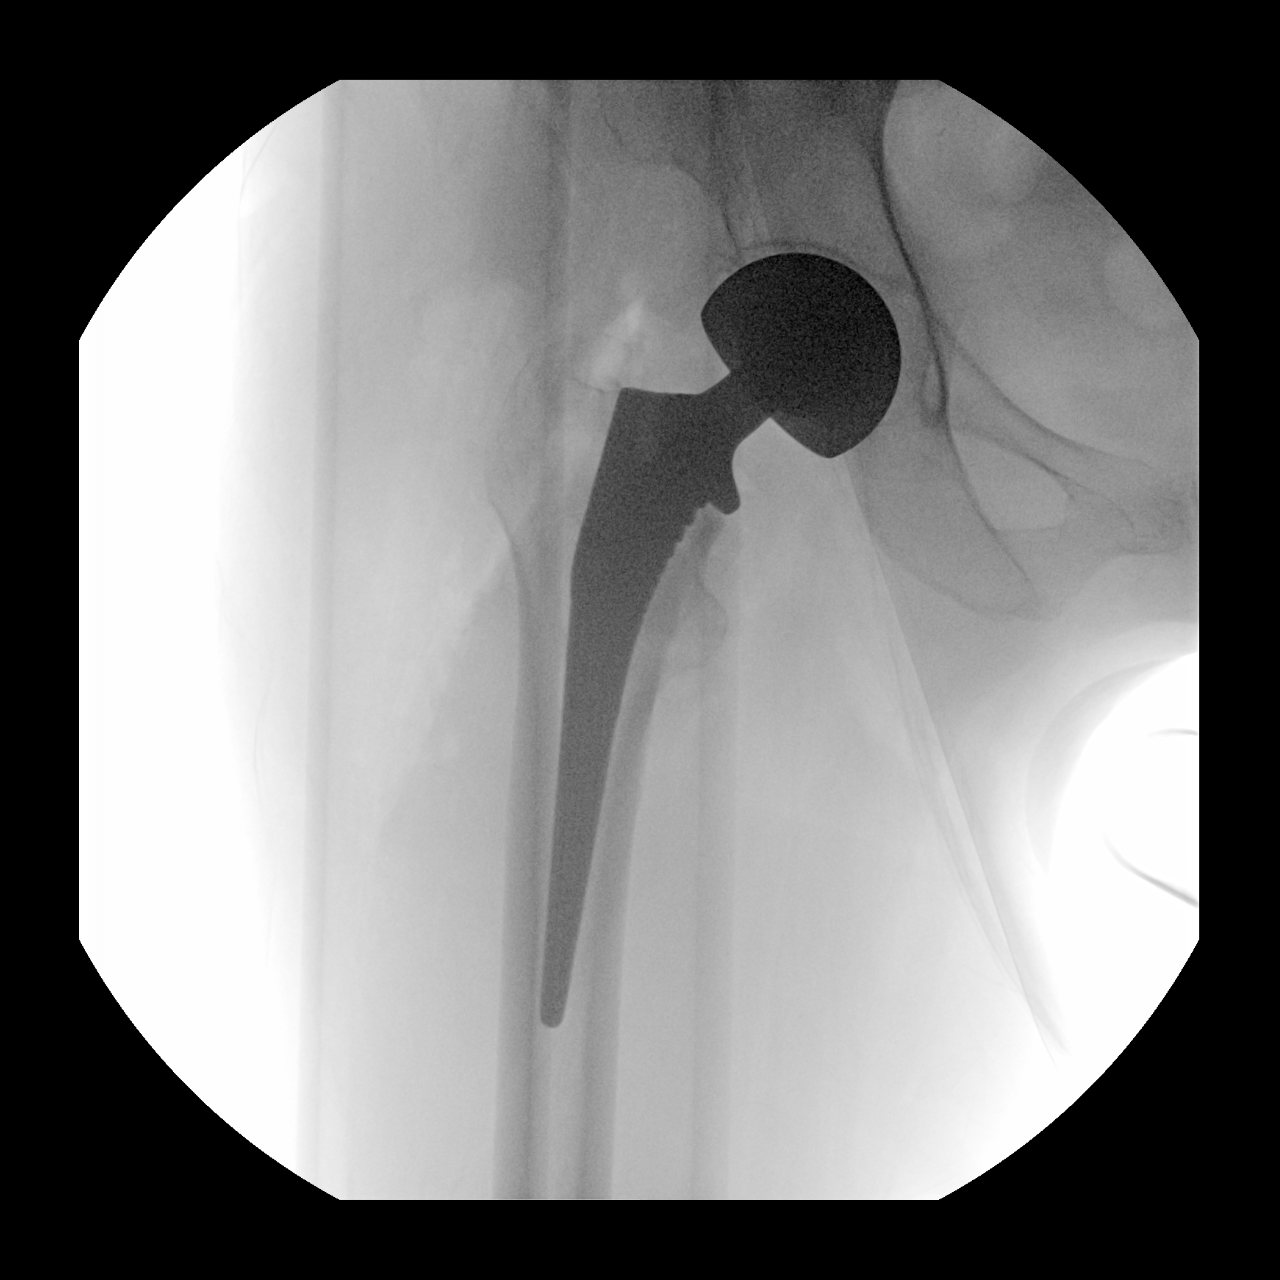
[im 2/3]
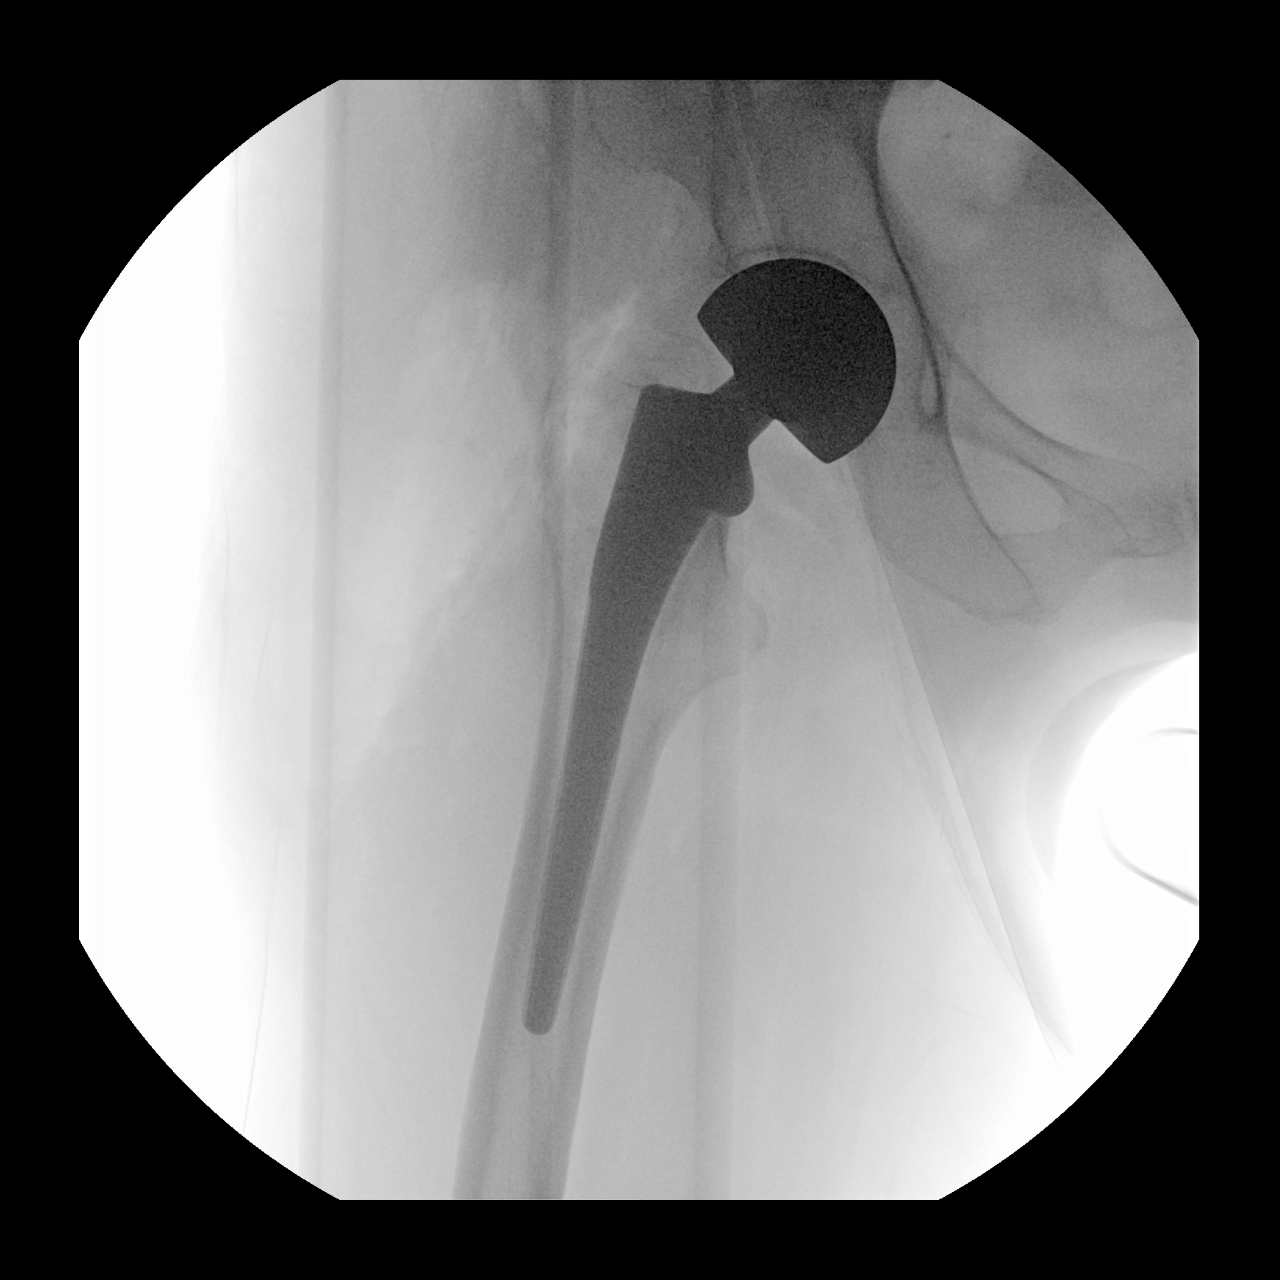
[im 3/3]
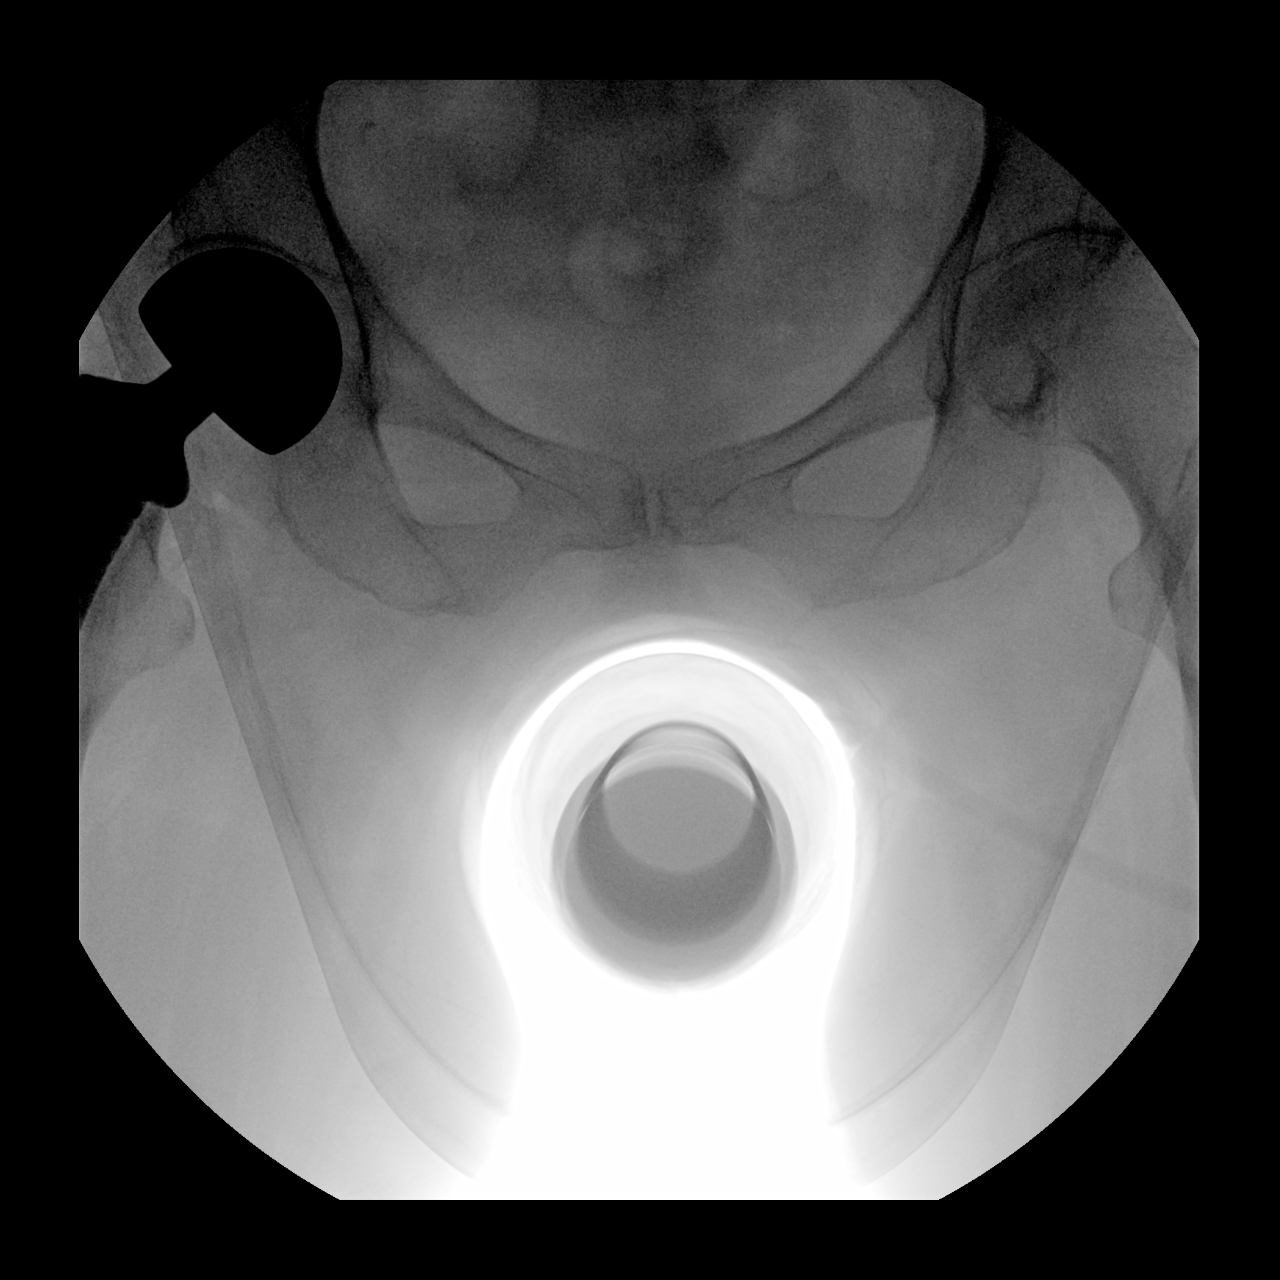

[3 of 3 positions shown; findings below may reference images not displayed]

FINDINGS: Three intraoperative fluoroscopic images were obtained of the right
hip. These demonstrate right hip prosthesis to be well situated.
IMPRESSION: Fluoroscopic guidance provided during right hip arthroplasty.

## 2023-07-08 NOTE — Progress Notes (Deleted)
  Cardiology Office Note:  .   Date:  07/08/2023  ID:  Kerry Carlson, DOB [birth year], MRN [id number] PCP: Gaspar Garbe, MD  [facility] Providers Cardiologist:  Dr. Jacinto Halim {  History of Present Illness: .   Kerry Carlson is a 89 y.o. female w/PMHx of  Advanced AV block w/PPM Permanent AFib HTN, DM (with diabetic polyneuropathy), CKD (IIIa-b)  Saw dr. Jacinto Halim last 07/10/22, a few months post hip surgery with a few subsequent falls, no injury. No cardiac complaints/concerns Had lost her transmitter after a few moves > planned to try to get another   Today's visit is scheduled as an annual device visit Following with Dr. Jacinto Halim, sees me today to re-engage EP in her device follow up  ROS:   *** no remotes *** transmitter? *** falls, ?syncope *** symptoms   Device information MDT single chamber PPM implanted 07/12/2016   Studies Reviewed: Marland Kitchen    EKG done today and reviewed by myself:  ***  DEVICE interrogation done today and reviewed by myself *** Battery and lead measurements are good ***   ***   Risk Assessment/Calculations:    Physical Exam:   VS:  There were no vitals taken for this visit.   Wt Readings from Last 3 Encounters:  02/19/23 160 lb 0.9 oz (72.6 kg)  07/10/22 166 lb (75.3 kg)  05/19/22 166 lb (75.3 kg)    GEN: Well nourished, well developed in no acute distress NECK: No JVD; No carotid bruits CARDIAC: ***RRR, no murmurs, rubs, gallops RESPIRATORY:  *** CTA b/l without rales, wheezing or rhonchi  ABDOMEN: Soft, non-tender, non-distended EXTREMITIES:  No edema; No deformity   PPM site: *** is stable, no thinning, fluctuation, tethering  ASSESSMENT AND PLAN: .    PPM *** intact function *** no programming changes made  permanent AFib CHA2DS2Vasc is 5, on Xarelto, *** appropriately dosed *** rate  HTN ***  Secondary hypercoagulable state 2/2 AFib     {Are you ordering a CV Procedure (e.g. stress test, cath, DCCV, TEE, etc)?    Press F2        :[id number]}     Dispo: ***  Signed, Sheilah Pigeon, PA-C

## 2023-07-10 ENCOUNTER — Ambulatory Visit: Payer: Self-pay | Admitting: Cardiology

## 2023-07-10 ENCOUNTER — Ambulatory Visit (INDEPENDENT_AMBULATORY_CARE_PROVIDER_SITE_OTHER): Payer: PRIVATE HEALTH INSURANCE

## 2023-07-10 ENCOUNTER — Ambulatory Visit: Payer: Medicare Other | Admitting: Physician Assistant

## 2023-07-10 DIAGNOSIS — I441 Atrioventricular block, second degree: Secondary | ICD-10-CM | POA: Diagnosis not present

## 2023-07-11 LAB — CUP PACEART REMOTE DEVICE CHECK
Battery Remaining Longevity: 110 mo
Battery Voltage: 3 V
Brady Statistic RV Percent Paced: 24.24 %
Date Time Interrogation Session: 20250318234327
Lead Channel Impedance Value: 380 Ohm
Lead Channel Impedance Value: 418 Ohm
Lead Channel Pacing Threshold Amplitude: 0.5 V
Lead Channel Pacing Threshold Pulse Width: 0.4 ms
Lead Channel Sensing Intrinsic Amplitude: 13.625 mV
Lead Channel Sensing Intrinsic Amplitude: 13.625 mV
Lead Channel Setting Pacing Amplitude: 1.5 V
Lead Channel Setting Pacing Pulse Width: 0.4 ms
Lead Channel Setting Sensing Sensitivity: 4 mV
Zone Setting Status: 755011

## 2023-08-01 ENCOUNTER — Encounter: Payer: Self-pay | Admitting: Pulmonary Disease

## 2023-08-01 ENCOUNTER — Ambulatory Visit: Attending: Pulmonary Disease | Admitting: Pulmonary Disease

## 2023-08-01 VITALS — BP 134/64 | HR 89 | Ht 66.0 in | Wt 153.0 lb

## 2023-08-01 DIAGNOSIS — I441 Atrioventricular block, second degree: Secondary | ICD-10-CM | POA: Insufficient documentation

## 2023-08-01 DIAGNOSIS — Z950 Presence of cardiac pacemaker: Secondary | ICD-10-CM | POA: Diagnosis not present

## 2023-08-01 DIAGNOSIS — I4821 Permanent atrial fibrillation: Secondary | ICD-10-CM | POA: Insufficient documentation

## 2023-08-01 NOTE — Progress Notes (Signed)
  Electrophysiology Office Note:   Date:  08/01/2023  ID:  Kerry Carlson, DOB [birth year], MRN [id number]  Primary Cardiologist: None Primary Heart Failure: None Electrophysiologist: Nobie Putnam, MD       History of Present Illness:   Kerry Carlson is a 89 y.o. female with h/o AF, HTN, HLD, hypothyroidism, former diabetes (now off medications seen today for routine electrophysiology followup.   The patient previously lived in [place].  She had a single-chamber MDT device placed for Mobitz 2 on 07/12/2016 by Dr. Cathlyn Parsons.  She previously was on Xarelto in the setting of AF.  However she still suffered multiple falls with bleeding episodes and anticoagulation was stopped.  Since last being seen in our clinic the patient reports she has been doing well.  She lives at her son and daughter-in-law's home.  She jokes that the TV is not great.  Family report she is not consistent with adequate water intake and eats too much chocolate.  Device specific complaints.   She denies chest pain, palpitations, dyspnea, PND, orthopnea, nausea, vomiting, dizziness, syncope, edema, weight gain, or early satiety.   Review of systems complete and found to be negative unless listed in HPI.   EP Information / Studies Reviewed:    EKG is ordered today. Personal review as below.  EKG Interpretation Date/Time:  Thursday August 01 2023 15:43:11 EDT Ventricular Rate:  84 PR Interval:    QRS Duration:  74 QT Interval:  374 QTC Calculation: 441 R Axis:   -4  Text Interpretation: Atrial fibrillation with occasional ventricular-paced complexes Confirmed by Canary Brim ([id number]) on 08/01/2023 4:23:56 PM   PPM Interrogation-  reviewed in detail today,  See PACEART report.  Device History: Medtronic Single Chamber PPM implanted 07/12/2016 for Second Degree AV block  Risk Assessment/Calculations:    CHA2DS2-VASc Score = 5   This indicates a 7.2% annual risk of stroke. The patient's score is based upon: CHF  History: 0 HTN History: 1 Diabetes History: 1 Stroke History: 0 Vascular Disease History: 0 Age Score: 2 Gender Score: 1             Physical Exam:   VS:  BP 134/64   Pulse 89   Ht 5\' 6"  (1.676 m)   Wt 153 lb (69.4 kg)   SpO2 96%   BMI 24.69 kg/m    Wt Readings from Last 3 Encounters:  08/01/23 153 lb (69.4 kg)  02/19/23 160 lb 0.9 oz (72.6 kg)  07/10/22 166 lb (75.3 kg)     GEN: Well nourished elderly female, well developed in no acute distress.  Ambulates into clinic with a rolling walker. NECK: No JVD; No carotid bruits CARDIAC: Irregularly irregular rate and rhythm, no murmurs, rubs, gallops RESPIRATORY:  Clear to auscultation without rales, wheezing or rhonchi  ABDOMEN: Soft, non-tender, non-distended EXTREMITIES:  No edema; No deformity   ASSESSMENT AND PLAN:    Second Degree AV block s/p Medtronic PPM  -Normal PPM function -See Pace Art report -No changes today  Atrial Fibrillation CHA2DS2-VASc 5 -Not on anticoagulation due to history of fall with bleeding episodes -Reviewed indications for anticoagulation and annual stroke risk with family who indicate understanding of potential consequences of being off anticoagulation  Disposition:   Follow up with Dr. Jimmey Ralph in 12 months  Signed, Canary Brim, NP-C, AGACNP-BC [facility] - Electrophysiology  08/01/2023, 5:03 PM

## 2023-08-01 NOTE — Patient Instructions (Signed)
 Medication Instructions:  Your physician recommends that you continue on your current medications as directed. Please refer to the Current Medication list given to you today.  *If you need a refill on your cardiac medications before your next appointment, please call your pharmacy*  Lab Work: None ordered If you have labs (blood work) drawn today and your tests are completely normal, you will receive your results only by: MyChart Message (if you have MyChart) OR A paper copy in the mail If you have any lab test that is abnormal or we need to change your treatment, we will call you to review the results.  Follow-Up: At [facility], you and your health needs are our priority.  As part of our continuing mission to provide you with exceptional heart care, our providers are all part of one team.  This team includes your primary Cardiologist (physician) and Advanced Practice Providers or APPs (Physician Assistants and Nurse Practitioners) who all work together to provide you with the care you need, when you need it.  Your next appointment:   1 year(s)  Provider:   You will see one of the following Advanced Practice Providers on your designated Care Team:   Francis Dowse, [place] Casimiro Needle "Mardelle Matte" Tillery, PA-C Canary Brim, NP      1st Floor: - Lobby - Registration  - Pharmacy  - Lab - Cafe  2nd Floor: - PV Lab - Diagnostic Testing (echo, CT, nuclear med)  3rd Floor: - Vacant  4th Floor: - TCTS (cardiothoracic surgery) - AFib Clinic - Structural Heart Clinic - Vascular Surgery  - Vascular Ultrasound  5th Floor: - HeartCare Cardiology (general and EP) - Clinical Pharmacy for coumadin, hypertension, lipid, weight-loss medications, and med management appointments    Valet parking services will be available as well.

## 2023-08-23 NOTE — Addendum Note (Signed)
 Addended by: Lott Rouleau A on: 08/23/2023 01:43 PM   Modules accepted: Orders

## 2023-08-23 NOTE — Progress Notes (Signed)
 Remote pacemaker transmission.

## 2023-10-09 ENCOUNTER — Ambulatory Visit (INDEPENDENT_AMBULATORY_CARE_PROVIDER_SITE_OTHER): Payer: PRIVATE HEALTH INSURANCE

## 2023-10-09 DIAGNOSIS — I441 Atrioventricular block, second degree: Secondary | ICD-10-CM | POA: Diagnosis not present

## 2023-10-10 ENCOUNTER — Ambulatory Visit: Payer: Self-pay | Admitting: Cardiology

## 2023-10-10 LAB — CUP PACEART REMOTE DEVICE CHECK
Battery Remaining Longevity: 106 mo
Battery Voltage: 3 V
Brady Statistic RV Percent Paced: 27.91 %
Date Time Interrogation Session: 20250618002357
Implantable Lead Connection Status: 753985
Implantable Lead Implant Date: 20180322
Implantable Lead Location: 753862
Implantable Lead Model: 5076
Implantable Pulse Generator Implant Date: 20180322
Lead Channel Impedance Value: 399 Ohm
Lead Channel Impedance Value: 418 Ohm
Lead Channel Pacing Threshold Amplitude: 0.375 V
Lead Channel Pacing Threshold Pulse Width: 0.4 ms
Lead Channel Sensing Intrinsic Amplitude: 11.875 mV
Lead Channel Sensing Intrinsic Amplitude: 11.875 mV
Lead Channel Setting Pacing Amplitude: 1.5 V
Lead Channel Setting Pacing Pulse Width: 0.4 ms
Lead Channel Setting Sensing Sensitivity: 4 mV
Zone Setting Status: 755011

## 2023-12-08 ENCOUNTER — Emergency Department (HOSPITAL_COMMUNITY)

## 2023-12-08 ENCOUNTER — Other Ambulatory Visit: Payer: Self-pay

## 2023-12-08 ENCOUNTER — Inpatient Hospital Stay (HOSPITAL_COMMUNITY)
Admission: EM | Admit: 2023-12-08 | Discharge: 2023-12-12 | DRG: 312 | Disposition: A | Discharge status: Skilled Nursing Facility | Attending: Family Medicine | Admitting: Family Medicine

## 2023-12-08 ENCOUNTER — Encounter (HOSPITAL_COMMUNITY): Payer: Self-pay | Admitting: Emergency Medicine

## 2023-12-08 DIAGNOSIS — E119 Type 2 diabetes mellitus without complications: Secondary | ICD-10-CM | POA: Diagnosis present

## 2023-12-08 DIAGNOSIS — I951 Orthostatic hypotension: Secondary | ICD-10-CM | POA: Diagnosis not present

## 2023-12-08 DIAGNOSIS — Z950 Presence of cardiac pacemaker: Secondary | ICD-10-CM

## 2023-12-08 DIAGNOSIS — E871 Hypo-osmolality and hyponatremia: Secondary | ICD-10-CM | POA: Diagnosis present

## 2023-12-08 DIAGNOSIS — E878 Other disorders of electrolyte and fluid balance, not elsewhere classified: Secondary | ICD-10-CM | POA: Diagnosis present

## 2023-12-08 DIAGNOSIS — F03918 Unspecified dementia, unspecified severity, with other behavioral disturbance: Secondary | ICD-10-CM | POA: Diagnosis present

## 2023-12-08 DIAGNOSIS — I480 Paroxysmal atrial fibrillation: Secondary | ICD-10-CM | POA: Diagnosis present

## 2023-12-08 DIAGNOSIS — S2231XA Fracture of one rib, right side, initial encounter for closed fracture: Secondary | ICD-10-CM

## 2023-12-08 DIAGNOSIS — Z882 Allergy status to sulfonamides status: Secondary | ICD-10-CM

## 2023-12-08 DIAGNOSIS — Z96641 Presence of right artificial hip joint: Secondary | ICD-10-CM | POA: Diagnosis present

## 2023-12-08 DIAGNOSIS — E875 Hyperkalemia: Secondary | ICD-10-CM | POA: Diagnosis present

## 2023-12-08 DIAGNOSIS — Z880 Allergy status to penicillin: Secondary | ICD-10-CM

## 2023-12-08 DIAGNOSIS — E039 Hypothyroidism, unspecified: Secondary | ICD-10-CM | POA: Diagnosis present

## 2023-12-08 DIAGNOSIS — E785 Hyperlipidemia, unspecified: Secondary | ICD-10-CM | POA: Diagnosis present

## 2023-12-08 DIAGNOSIS — R55 Syncope and collapse: Secondary | ICD-10-CM | POA: Diagnosis not present

## 2023-12-08 DIAGNOSIS — R008 Other abnormalities of heart beat: Secondary | ICD-10-CM | POA: Diagnosis present

## 2023-12-08 DIAGNOSIS — Z79890 Hormone replacement therapy (postmenopausal): Secondary | ICD-10-CM

## 2023-12-08 DIAGNOSIS — Z66 Do not resuscitate: Secondary | ICD-10-CM | POA: Diagnosis present

## 2023-12-08 DIAGNOSIS — Z823 Family history of stroke: Secondary | ICD-10-CM

## 2023-12-08 DIAGNOSIS — E860 Dehydration: Secondary | ICD-10-CM | POA: Diagnosis present

## 2023-12-08 DIAGNOSIS — I482 Chronic atrial fibrillation, unspecified: Secondary | ICD-10-CM

## 2023-12-08 DIAGNOSIS — I10 Essential (primary) hypertension: Secondary | ICD-10-CM | POA: Diagnosis present

## 2023-12-08 DIAGNOSIS — Z79899 Other long term (current) drug therapy: Secondary | ICD-10-CM

## 2023-12-08 DIAGNOSIS — G9341 Metabolic encephalopathy: Secondary | ICD-10-CM

## 2023-12-08 DIAGNOSIS — Z888 Allergy status to other drugs, medicaments and biological substances status: Secondary | ICD-10-CM

## 2023-12-08 LAB — COMPREHENSIVE METABOLIC PANEL WITH GFR
ALT: 16 U/L (ref 0–44)
AST: 19 U/L (ref 15–41)
Albumin: 3.5 g/dL (ref 3.5–5.0)
Alkaline Phosphatase: 45 U/L (ref 38–126)
Anion gap: 11 (ref 5–15)
BUN: 13 mg/dL (ref 8–23)
CO2: 25 mmol/L (ref 22–32)
Calcium: 8.9 mg/dL (ref 8.9–10.3)
Chloride: 92 mmol/L — ABNORMAL LOW (ref 98–111)
Creatinine, Ser: 0.91 mg/dL (ref 0.44–1.00)
GFR, Estimated: 58 mL/min — ABNORMAL LOW (ref >60)
Glucose, Bld: 124 mg/dL — ABNORMAL HIGH (ref 70–99)
Potassium: 3.7 mmol/L (ref 3.5–5.1)
Sodium: 128 mmol/L — ABNORMAL LOW (ref 135–145)
Total Bilirubin: 0.8 mg/dL (ref 0.0–1.2)
Total Protein: 6.1 g/dL — ABNORMAL LOW (ref 6.5–8.1)

## 2023-12-08 LAB — CBC WITH DIFFERENTIAL/PLATELET
Abs Immature Granulocytes: 0.05 K/uL (ref 0.00–0.07)
Basophils Absolute: 0 K/uL (ref 0.0–0.1)
Basophils Relative: 0 %
Eosinophils Absolute: 0 K/uL (ref 0.0–0.5)
Eosinophils Relative: 0 %
HCT: 42.1 % (ref 36.0–46.0)
Hemoglobin: 14.2 g/dL (ref 12.0–15.0)
Immature Granulocytes: 1 %
Lymphocytes Relative: 24 %
Lymphs Abs: 2.4 K/uL (ref 0.7–4.0)
MCH: 31.6 pg (ref 26.0–34.0)
MCHC: 33.7 g/dL (ref 30.0–36.0)
MCV: 93.8 fL (ref 80.0–100.0)
Monocytes Absolute: 0.6 K/uL (ref 0.1–1.0)
Monocytes Relative: 6 %
Neutro Abs: 6.7 K/uL (ref 1.7–7.7)
Neutrophils Relative %: 69 %
Platelets: 209 K/uL (ref 150–400)
RBC: 4.49 MIL/uL (ref 3.87–5.11)
RDW: 13.4 % (ref 11.5–15.5)
WBC: 9.8 K/uL (ref 4.0–10.5)
nRBC: 0 % (ref 0.0–0.2)

## 2023-12-08 LAB — URINALYSIS, ROUTINE W REFLEX MICROSCOPIC
Bilirubin Urine: NEGATIVE
Glucose, UA: NEGATIVE mg/dL
Hgb urine dipstick: NEGATIVE
Ketones, ur: 5 mg/dL — AB
Nitrite: POSITIVE — AB
Protein, ur: NEGATIVE mg/dL
Specific Gravity, Urine: 1.003 — ABNORMAL LOW (ref 1.005–1.030)
pH: 6 (ref 5.0–8.0)

## 2023-12-08 LAB — MAGNESIUM: Magnesium: 2 mg/dL (ref 1.7–2.4)

## 2023-12-08 LAB — TROPONIN I (HIGH SENSITIVITY): Troponin I (High Sensitivity): 11 ng/L (ref <18)

## 2023-12-08 LAB — TSH: TSH: 1.623 u[IU]/mL (ref 0.350–4.500)

## 2023-12-08 MED ORDER — ONDANSETRON HCL 4 MG/2ML IJ SOLN: 4.0000 mg | Freq: Four times a day (QID) | INTRAMUSCULAR | Status: DC | PRN

## 2023-12-08 MED ORDER — ACETAMINOPHEN 325 MG PO TABS: 650.0000 mg | ORAL_TABLET | Freq: Four times a day (QID) | ORAL | Status: DC | PRN

## 2023-12-08 MED ORDER — ATENOLOL 25 MG PO TABS
100.0000 mg | ORAL_TABLET | Freq: Two times a day (BID) | ORAL | Status: DC
  Administered 2023-12-08 – 2023-12-11 (×7): 100 mg via ORAL
  Filled 2023-12-08 (×8): qty 4

## 2023-12-08 MED ORDER — FAMOTIDINE 20 MG PO TABS
20.0000 mg | ORAL_TABLET | Freq: Every evening | ORAL | Status: DC
  Administered 2023-12-08 – 2023-12-11 (×4): 20 mg via ORAL
  Filled 2023-12-08 (×4): qty 1

## 2023-12-08 MED ORDER — TRAZODONE HCL 50 MG PO TABS
25.0000 mg | ORAL_TABLET | Freq: Every evening | ORAL | Status: DC | PRN
  Administered 2023-12-08: 25 mg via ORAL
  Filled 2023-12-08: qty 1

## 2023-12-08 MED ORDER — LEVOTHYROXINE SODIUM 100 MCG PO TABS
100.0000 ug | ORAL_TABLET | Freq: Every day | ORAL | Status: DC
  Administered 2023-12-09 – 2023-12-12 (×4): 100 ug via ORAL
  Filled 2023-12-08: qty 2
  Filled 2023-12-08 (×3): qty 1

## 2023-12-08 MED ORDER — PRAVASTATIN SODIUM 10 MG PO TABS
10.0000 mg | ORAL_TABLET | Freq: Every evening | ORAL | Status: DC
  Administered 2023-12-08 – 2023-12-10 (×3): 10 mg via ORAL
  Filled 2023-12-08 (×3): qty 1

## 2023-12-08 MED ORDER — SODIUM CHLORIDE 0.9 % IV SOLN: INTRAVENOUS | Status: AC

## 2023-12-08 MED ORDER — LACTATED RINGERS IV BOLUS
1000.0000 mL | Freq: Once | INTRAVENOUS | Status: AC
  Administered 2023-12-08: 1000 mL via INTRAVENOUS

## 2023-12-08 MED ORDER — ENOXAPARIN SODIUM 40 MG/0.4ML IJ SOSY
40.0000 mg | PREFILLED_SYRINGE | INTRAMUSCULAR | Status: DC
  Administered 2023-12-08 – 2023-12-11 (×4): 40 mg via SUBCUTANEOUS
  Filled 2023-12-08 (×4): qty 0.4

## 2023-12-08 MED ORDER — ADULT MULTIVITAMIN W/MINERALS CH
1.0000 | ORAL_TABLET | Freq: Every day | ORAL | Status: DC
  Administered 2023-12-09 – 2023-12-12 (×4): 1 via ORAL
  Filled 2023-12-08 (×4): qty 1

## 2023-12-08 MED ORDER — MAGNESIUM HYDROXIDE 400 MG/5ML PO SUSP: 30.0000 mL | Freq: Every day | ORAL | Status: DC | PRN

## 2023-12-08 MED ORDER — ONDANSETRON HCL 4 MG PO TABS
4.0000 mg | ORAL_TABLET | Freq: Four times a day (QID) | ORAL | Status: DC | PRN
Start: 2023-12-08 — End: 2023-12-12

## 2023-12-08 MED ORDER — ACETAMINOPHEN 650 MG RE SUPP: 650.0000 mg | Freq: Four times a day (QID) | RECTAL | Status: DC | PRN

## 2023-12-08 MED ORDER — MEMANTINE HCL 10 MG PO TABS
10.0000 mg | ORAL_TABLET | Freq: Two times a day (BID) | ORAL | Status: DC
  Administered 2023-12-08 – 2023-12-12 (×8): 10 mg via ORAL
  Filled 2023-12-08 (×8): qty 1

## 2023-12-08 MED ORDER — SODIUM CHLORIDE 0.9% FLUSH
3.0000 mL | Freq: Two times a day (BID) | INTRAVENOUS | Status: DC
  Administered 2023-12-08 – 2023-12-12 (×8): 3 mL via INTRAVENOUS

## 2023-12-08 NOTE — Assessment & Plan Note (Signed)
-   This could be contributing to generalized weakness and fall. - She will be hydrated with IV normal saline. - Will limit p.o. fluids.

## 2023-12-08 NOTE — ED Provider Notes (Signed)
 [place] EMERGENCY DEPARTMENT AT [facility] Provider Note   CSN: [id number] Arrival date & time: 12/08/23  1711     Patient presents with: Kerry Carlson is a 89 y.o. female.   Patient is a 89 year old female who presents emergency department from home secondary to fall and syncopal event.  Family notes that she has also been more altered.  Family notes that just prior to arrival she was walking into her room when she fell to the ground and was unconscious for approximately 1 minute.  Patient does have a history of dementia but notes that she is unsure if she may have passed out.  Patient currently denies any pain at this point.  She denies any associated active dizziness or lightheadedness.  She denies any chest pain or shortness of breath.  She denies any palpitations.  There is been no abdominal pain, nausea, vomiting, diarrhea.   Fall       Prior to Admission medications   Medication Sig Start Date End Date Taking? Authorizing Provider  acetaminophen  (TYLENOL ) 500 MG tablet Take 1,000 mg by mouth 2 (two) times daily. 04/26/21   [provider]  atenolol  (TENORMIN ) 100 MG tablet Take 1 tablet (100 mg total) by mouth 2 (two) times daily. 04/26/21   Gonfa, Taye T, MD  famotidine  (PEPCID ) 20 MG tablet Take 20 mg by mouth daily.    [provider]  levothyroxine  (SYNTHROID ) 100 MCG tablet Take 100 mcg by mouth daily before breakfast.     [provider]  Multiple Vitamin (MULTIVITAMIN WITH MINERALS) TABS tablet Take 1 tablet by mouth daily. 04/26/21   Gonfa, Taye T, MD  NAMENDA  10 MG tablet Take 10 mg by mouth 2 (two) times daily.    [provider]  pravastatin  (PRAVACHOL ) 10 MG tablet Take 10 mg by mouth daily.    [provider]    Allergies: Aspirin, Memantine , Nitrofurantoin, Sulfamethoxazole-trimethoprim, Naproxen, and Penicillins    Review of Systems  Neurological:        Syncope, altered mental status  All other  systems reviewed and are negative.   Updated Vital Signs BP (!) 176/107   Pulse 84   Temp 97.6 F (36.4 C) (Oral)   Resp 13   Ht 5' 6 (1.676 m)   Wt 70.3 kg   SpO2 96%   BMI 25.02 kg/m   Physical Exam Vitals and nursing note reviewed.  Constitutional:      Appearance: Normal appearance.  HENT:     Head: Normocephalic and atraumatic.     Nose: Nose normal.     Mouth/Throat:     Mouth: Mucous membranes are moist.  Eyes:     Extraocular Movements: Extraocular movements intact.     Conjunctiva/sclera: Conjunctivae normal.     Pupils: Pupils are equal, round, and reactive to light.  Cardiovascular:     Rate and Rhythm: Normal rate. Rhythm irregular.     Pulses: Normal pulses.     Heart sounds: Normal heart sounds. No murmur heard.    No gallop.  Pulmonary:     Effort: Pulmonary effort is normal. No respiratory distress.     Breath sounds: Normal breath sounds. No stridor. No wheezing, rhonchi or rales.  Chest:     Chest wall: No tenderness.  Abdominal:     General: Abdomen is flat. Bowel sounds are normal. There is no distension.     Palpations: Abdomen is soft.     Tenderness: There  is no abdominal tenderness. There is no guarding.  Musculoskeletal:        General: Normal range of motion.     Cervical back: Normal range of motion and neck supple. No rigidity or tenderness.     Comments: Nontender palpation of bilateral upper and lower extremities, pelvis stable to AP and lateral compression, sensation intact distally, full range of motion noted throughout, no obvious deformity or bruising, no skin breakdown or ulceration, no lacerations or abrasions, mild tenderness palpation over upper thoracic spine, nontender palpation remainder of thoracic and lumbar spine, no obvious step-off or deformity  Skin:    General: Skin is warm and dry.     Findings: No bruising or rash.  Neurological:     General: No focal deficit present.     Mental Status: She is alert and oriented to  person, place, and time. Mental status is at baseline.     Cranial Nerves: No cranial nerve deficit.     Sensory: No sensory deficit.     Motor: No weakness.     Coordination: Coordination normal.  Psychiatric:        Mood and Affect: Mood normal.        Behavior: Behavior normal.        Thought Content: Thought content normal.        Judgment: Judgment normal.     (all labs ordered are listed, but only abnormal results are displayed) Labs Reviewed  COMPREHENSIVE METABOLIC PANEL WITH GFR - Abnormal; Notable for the following components:      Result Value   Sodium 128 (*)    Chloride 92 (*)    Glucose, Bld 124 (*)    Total Protein 6.1 (*)    GFR, Estimated 58 (*)    All other components within normal limits  CBC WITH DIFFERENTIAL/PLATELET  MAGNESIUM   URINALYSIS, ROUTINE W REFLEX MICROSCOPIC  TROPONIN I (HIGH SENSITIVITY)    EKG: None  Radiology: CT Thoracic Spine Wo Contrast Result Date: 12/08/2023 CLINICAL DATA:  Ataxia, thoracic trauma.  Syncope and fall. EXAM: CT THORACIC SPINE WITHOUT CONTRAST TECHNIQUE: Multidetector CT images of the thoracic were obtained using the standard protocol without intravenous contrast. RADIATION DOSE REDUCTION: This exam was performed according to the departmental dose-optimization program which includes automated exposure control, adjustment of the mA and/or kV according to patient size and/or use of iterative reconstruction technique. COMPARISON:  None Available. FINDINGS: Alignment: Trace anterolisthesis of C7 on T1. Mild lower thoracic levoscoliosis and partially visualized lumbar dextroscoliosis. Vertebrae: Diffuse osteopenia. Mild T9 and T12 superior endplate compression fractures without a visible acute fracture line or paraspinal hematoma or swelling. Acute, mildly displaced fracture of the posteromedial right ninth rib. Paraspinal and other soft tissues: Small sliding hiatal hernia. Prominent gaseous distension of the proximal esophagus.  Aortic and coronary atherosclerosis. Pacemaker terminating in the right ventricle. No visible pneumothorax or pleural effusion. Small stone in the gallbladder. Disc levels: Mild retropulsion of the T12 superior endplate results in at most mild spinal stenosis. Minimal spondylosis for age. IMPRESSION: 1. Acute posterior right ninth rib fracture. 2. Mild T9 and T12 compression fractures, age indeterminate. 3.  Aortic Atherosclerosis (ICD10-I70.0). Electronically Signed   By: Dasie Hamburg M.D.   On: 12/08/2023 19:05   CT Head Wo Contrast Result Date: 12/08/2023 CLINICAL DATA:  Head trauma, minor (Age >= 65y); Ataxia, cervical trauma. Syncope and fall. History of dementia. EXAM: CT HEAD WITHOUT CONTRAST CT CERVICAL SPINE WITHOUT CONTRAST TECHNIQUE: Multidetector CT imaging of the head  and cervical spine was performed following the standard protocol without intravenous contrast. Multiplanar CT image reconstructions of the cervical spine were also generated. RADIATION DOSE REDUCTION: This exam was performed according to the departmental dose-optimization program which includes automated exposure control, adjustment of the mA and/or kV according to patient size and/or use of iterative reconstruction technique. COMPARISON:  CT head 02/18/2023.  CT cervical spine 05/19/2022. FINDINGS: CT HEAD FINDINGS Brain: There is no evidence of an acute infarct, intracranial hemorrhage, mass, significant midline shift, or acute extra-axial fluid collection. There is mild-to-moderate cerebral atrophy. A small amount of slightly asymmetric low-density extra-axial fluid/CSF over the left frontal convexity is unchanged. Cerebral white matter hypodensities are unchanged and nonspecific but compatible with moderate chronic small vessel ischemic disease. Vascular: Calcified atherosclerosis at the skull base. No hyperdense vessel. Skull: No acute fracture or suspicious lesion. Sinuses/Orbits: Paranasal sinuses and mastoid air cells are clear.  Bilateral cataract extraction. Other: None. CT CERVICAL SPINE FINDINGS Alignment: Mild straightening of the normal cervical lordosis. Unchanged trace anterolisthesis of C7 on T1. Skull base and vertebrae: No acute fracture or suspicious lesion. Soft tissues and spinal canal: No prevertebral fluid or swelling. No visible canal hematoma. Disc levels:  Mild for age cervical disc and facet degeneration. Upper chest: No acute abnormality in the included lung apices. Other: Prominent gaseous distension of the proximal esophagus. IMPRESSION: 1. No evidence of acute intracranial abnormality or cervical spine fracture. 2. Moderate chronic small vessel ischemic disease. Electronically Signed   By: Dasie Hamburg M.D.   On: 12/08/2023 18:56   CT Cervical Spine Wo Contrast Result Date: 12/08/2023 CLINICAL DATA:  Head trauma, minor (Age >= 65y); Ataxia, cervical trauma. Syncope and fall. History of dementia. EXAM: CT HEAD WITHOUT CONTRAST CT CERVICAL SPINE WITHOUT CONTRAST TECHNIQUE: Multidetector CT imaging of the head and cervical spine was performed following the standard protocol without intravenous contrast. Multiplanar CT image reconstructions of the cervical spine were also generated. RADIATION DOSE REDUCTION: This exam was performed according to the departmental dose-optimization program which includes automated exposure control, adjustment of the mA and/or kV according to patient size and/or use of iterative reconstruction technique. COMPARISON:  CT head 02/18/2023.  CT cervical spine 05/19/2022. FINDINGS: CT HEAD FINDINGS Brain: There is no evidence of an acute infarct, intracranial hemorrhage, mass, significant midline shift, or acute extra-axial fluid collection. There is mild-to-moderate cerebral atrophy. A small amount of slightly asymmetric low-density extra-axial fluid/CSF over the left frontal convexity is unchanged. Cerebral white matter hypodensities are unchanged and nonspecific but compatible with moderate  chronic small vessel ischemic disease. Vascular: Calcified atherosclerosis at the skull base. No hyperdense vessel. Skull: No acute fracture or suspicious lesion. Sinuses/Orbits: Paranasal sinuses and mastoid air cells are clear. Bilateral cataract extraction. Other: None. CT CERVICAL SPINE FINDINGS Alignment: Mild straightening of the normal cervical lordosis. Unchanged trace anterolisthesis of C7 on T1. Skull base and vertebrae: No acute fracture or suspicious lesion. Soft tissues and spinal canal: No prevertebral fluid or swelling. No visible canal hematoma. Disc levels:  Mild for age cervical disc and facet degeneration. Upper chest: No acute abnormality in the included lung apices. Other: Prominent gaseous distension of the proximal esophagus. IMPRESSION: 1. No evidence of acute intracranial abnormality or cervical spine fracture. 2. Moderate chronic small vessel ischemic disease. Electronically Signed   By: Dasie Hamburg M.D.   On: 12/08/2023 18:56   DG Chest 1 View Result Date: 12/08/2023 CLINICAL DATA:  Fall, syncope EXAM: CHEST  1 VIEW COMPARISON:  None Available.  FINDINGS: LEFT-sided pacer overlies normal cardiac silhouette. No effusion, infiltrate or pneumothorax. No acute osseous abnormality. IMPRESSION: No acute cardiopulmonary process. Electronically Signed   By: Jackquline Boxer M.D.   On: 12/08/2023 18:02     Procedures   Medications Ordered in the ED  lactated ringers  bolus 1,000 mL (1,000 mLs Intravenous New Bag/Given 12/08/23 1747)                                    Medical Decision Making Amount and/or Complexity of Data Reviewed Labs: ordered. Radiology: ordered.  Risk Decision regarding hospitalization.   This patient presents to the ED for concern of syncope, altered mental status, this involves an extensive number of treatment options, and is a complaint that carries with it a high risk of complications and morbidity.  The differential diagnosis includes CVA, TIA,  orthostatic hypotension, dehydration, acute kidney injury, electrolyte derangement, urinary tract infection, pneumonia, sepsis, intracranial hemorrhage, vertebral fracture, long bone or joint fracture   Co morbidities that complicate the patient evaluation  Dementia   Additional history obtained:  Additional history obtained from family External records from outside source obtained and reviewed including medical records   Lab Tests:  I Ordered, and personally interpreted labs.  The pertinent results include: No leukocytosis, no anemia, hyponatremia noted, normal kidney function and liver function, negative troponin   Imaging Studies ordered:  I ordered imaging studies including CT scan of head, cervical spine, thoracic spine, chest x-ray I independently visualized and interpreted imaging which showed no acute intracranial process, no cervical spine fracture, age-indeterminate T9 and T12 compression fracture, right posterior ninth rib fracture, no other acute cardiopulmonary process I agree with the radiologist interpretation   Cardiac Monitoring: / EKG:  The patient was maintained on a cardiac monitor.  I personally viewed and interpreted the cardiac monitored which showed an underlying rhythm of: Atrial fibrillation, no ST/T wave changes, no ischemic changes, no STEMI   Consultations Obtained:  I requested consultation with the hospitalist,  and discussed lab and imaging findings as well as pertinent plan - they recommend: Admission   Problem List / ED Course / Critical interventions / Medication management  Patient is doing well at this time and does remain stable.  Discussed with patient and family that we will plan for admission to the hospital service given her unexplained syncope, altered mental status.  Family also voices that they are concerned that she may need to be placed in long-term care.  Patient's blood work was overall unremarkable except for hyponatremia.  She  was given IV fluids in the emergency department.  Suspect that her thoracic spine fractures are chronic in nature as she was not tender palpation directly over this area.  Remaining imaging was unremarkable.  She was not sure palpation of remainder of long bones and joints.  She had no direct chest wall or abdominal tenderness.  Did discuss patient case with Dr. Lawence with the hospital service who has excepted for admission. I ordered medication including IV fluids for dehydration and syncope Reevaluation of the patient after these medicines showed that the patient improved I have reviewed the patients home medicines and have made adjustments as needed   Social Determinants of Health:  None   Test / Admission - Considered:  Admission     Final diagnoses:  None    ED Discharge Orders     None  Daralene Lonni BIRCH, PA-C 12/08/23 1956    Suzette Pac, MD 12/11/23 1046

## 2023-12-08 NOTE — Assessment & Plan Note (Signed)
-  The patient will be admitted to an observation medically monitored bed. - Will check orthostatics q 12 hours. - Will obtain a bilateral carotid Doppler and 2D echo. - The patient will be gently hydrated with IV normal saline and monitored for arrhythmias. -Differential diagnoses would include neurally mediated syncope, cardiogenic, arrhythmias related,  orthostatic hypotension and less likely hypoglycemia.

## 2023-12-08 NOTE — ED Triage Notes (Signed)
 Per [facility] pt coming from home- family called out for concern for syncopal episode while walking in her room and falling onto carpet floor. Denies any injury from fall. Hx of dementia. Patient alert to self and month, unsure of year. Patient states she does not think she passed out however her family told her she did.

## 2023-12-08 NOTE — Assessment & Plan Note (Signed)
-   The patient has a CHA2DS2-VASc score of 5. - She is not on anticoagulation due to fall risk.

## 2023-12-08 NOTE — Assessment & Plan Note (Signed)
-   Will continue antihypertensive therapy.

## 2023-12-08 NOTE — Assessment & Plan Note (Signed)
 Will continue statin therapy

## 2023-12-08 NOTE — Assessment & Plan Note (Addendum)
-   Will continue Synthroid  and check TSH level.

## 2023-12-08 NOTE — H&P (Signed)
 [place]   PATIENT NAME: Kerry Carlson    MR#:  [id number]  DATE OF BIRTH:  04-28-1925  DATE OF ADMISSION:  12/08/2023  PRIMARY CARE PHYSICIAN: Tisovec, Charlie ORN, MD   Patient is coming from: Home  REQUESTING/REFERRING PHYSICIAN: Daralene Lonni BIRCH, PA-C  CHIEF COMPLAINT:   Chief Complaint  Patient presents with   Fall    HISTORY OF PRESENT ILLNESS:  Kerry Carlson is a 89 y.o. Caucasian female with medical history significant for type diabetes mellitus, hypertension, dyslipidemia, paroxysmal atrial fibrillation, s/p PPM, who presented to the emergency room with acute onset of syncope with subsequent fall without head injuries.  She has been having mild altered mental status since her fall.  She denies any paresthesias or focal muscle weakness.  No chest pain or palpitations.  She has been having cough productive of clear sputum without wheezing or dyspnea.  No fever or chills.  No dysuria, oliguria or hematuria or flank pain.  No tinnitus or vertigo.  She stated that she had her back but denied any current pain.  No headache or dizziness or blurred vision.  She was unconscious for about a minute. ED Course: When she came to the ER, BP was 159/110 with otherwise normal vital signs.  Labs revealed hyponatremia 128 and hypochloremia of 92 with otherwise unremarkable CMP.  Total protein was 6.1.  CBC was within normal. EKG as reviewed by me : EKG showed atrial fibrillation with controlled ventricular response of 90 with ventricular bigeminy and Q waves anteroseptal lead. Imaging: Portable chest x-ray showed no acute cardiopulmonary disease.  The patient was given 1 L bolus of IV lactated Ringer .  Pacemaker has been interrogated but the report is currently pending.  She will be admitted to a medical telemetry observation bed for further evaluation and management. PAST MEDICAL HISTORY:   Past Medical History:  Diagnosis Date   Atrial fibrillation (HCC)    Diabetes mellitus  without complication (HCC)    Type 2   Hyperlipidemia    Hypertension    Pacemaker - Medtronic Azure single chamber pacemaker 07/12/2016. 05/23/2020   Second degree Mobitz II AV block 05/23/2020   Thyroid  disease    Hypothyroid    PAST SURGICAL HISTORY:   Past Surgical History:  Procedure Laterality Date   ABDOMINAL HYSTERECTOMY     PACEMAKER INSERTION  10/11/2016   Medtronic Azure MRI single-chamber pacemaker in [facility]   TOTAL HIP ARTHROPLASTY Right 04/24/2021   Procedure: PARTIAL vs. TOTAL HIP ARTHROPLASTY ANTERIOR APPROACH;  Surgeon: Vernetta Lonni GRADE, MD;  Location: [facility] OR;  Service: Orthopedics;  Laterality: Right;    SOCIAL HISTORY:   Social History   Tobacco Use   Smoking status: Never   Smokeless tobacco: Never  Substance Use Topics   Alcohol use: No    FAMILY HISTORY:   Family History  Problem Relation Age of Onset   Stroke Mother    Stroke Father     DRUG ALLERGIES:   Allergies  Allergen Reactions   Aspirin Nausea And Vomiting   Memantine      Other Reaction(s): was more sedated   Nitrofurantoin Other (See Comments)   Sulfamethoxazole-Trimethoprim     Other Reaction(s): increased confusion   Naproxen Hives and Rash   Penicillins Hives, Rash and Other (See Comments)    Had a really long time ago, caused hives    REVIEW OF SYSTEMS:   ROS As per history of present illness. All pertinent systems were reviewed above.  Constitutional, HEENT, cardiovascular, respiratory, GI, GU, musculoskeletal, neuro, psychiatric, endocrine, integumentary and hematologic systems were reviewed and are otherwise negative/unremarkable except for positive findings mentioned above in the HPI.   MEDICATIONS AT HOME:   Prior to Admission medications   Medication Sig Start Date End Date Taking? Authorizing Provider  acetaminophen  (TYLENOL ) 500 MG tablet Take 1,000 mg by mouth 2 (two) times daily. 04/26/21   [provider]  atenolol  (TENORMIN ) 100 MG tablet Take 1  tablet (100 mg total) by mouth 2 (two) times daily. 04/26/21   Gonfa, Taye T, MD  famotidine  (PEPCID ) 20 MG tablet Take 20 mg by mouth daily.    [provider]  levothyroxine  (SYNTHROID ) 100 MCG tablet Take 100 mcg by mouth daily before breakfast.     [provider]  Multiple Vitamin (MULTIVITAMIN WITH MINERALS) TABS tablet Take 1 tablet by mouth daily. 04/26/21   Gonfa, Taye T, MD  NAMENDA  10 MG tablet Take 10 mg by mouth 2 (two) times daily.    [provider]  pravastatin  (PRAVACHOL ) 10 MG tablet Take 10 mg by mouth daily.    [provider]      VITAL SIGNS:  Blood pressure (!) 176/107, pulse 84, temperature 97.6 F (36.4 C), temperature source Oral, resp. rate 13, height 5' 6 (1.676 m), weight 70.3 kg, SpO2 96%.  PHYSICAL EXAMINATION:  Physical Exam  GENERAL:  89 y.o.-year-old Caucasian female patient lying in the bed with no acute distress.  EYES: Pupils equal, round, reactive to light and accommodation. No scleral icterus. Extraocular muscles intact.  HEENT: Head atraumatic, normocephalic. Oropharynx and nasopharynx clear.  NECK:  Supple, no jugular venous distention. No thyroid  enlargement, no tenderness.  LUNGS: Normal breath sounds bilaterally, no wheezing, rales,rhonchi or crepitation. No use of accessory muscles of respiration.  CARDIOVASCULAR: Regular rate and rhythm, S1, S2 normal. No murmurs, rubs, or gallops.  ABDOMEN: Soft, nondistended, nontender. Bowel sounds present. No organomegaly or mass.  EXTREMITIES: No pedal edema, cyanosis, or clubbing.  NEUROLOGIC: Cranial nerves II through XII are intact. Muscle strength 5/5 in all extremities. Sensation intact. Gait not checked.  PSYCHIATRIC: The patient is alert and oriented x 3.  Normal affect and good eye contact. SKIN: No obvious rash, lesion, or ulcer.   LABORATORY PANEL:   CBC Recent Labs  Lab 12/08/23 1747  WBC 9.8  HGB 14.2  HCT 42.1  PLT 209    ------------------------------------------------------------------------------------------------------------------  Chemistries  Recent Labs  Lab 12/08/23 1747  NA 128*  K 3.7  CL 92*  CO2 25  GLUCOSE 124*  BUN 13  CREATININE 0.91  CALCIUM 8.9  MG 2.0  AST 19  ALT 16  ALKPHOS 45  BILITOT 0.8   ------------------------------------------------------------------------------------------------------------------  Cardiac Enzymes No results for input(s): TROPONINI in the last 168 hours. ------------------------------------------------------------------------------------------------------------------  RADIOLOGY:  CT Thoracic Spine Wo Contrast Result Date: 12/08/2023 CLINICAL DATA:  Ataxia, thoracic trauma.  Syncope and fall. EXAM: CT THORACIC SPINE WITHOUT CONTRAST TECHNIQUE: Multidetector CT images of the thoracic were obtained using the standard protocol without intravenous contrast. RADIATION DOSE REDUCTION: This exam was performed according to the departmental dose-optimization program which includes automated exposure control, adjustment of the mA and/or kV according to patient size and/or use of iterative reconstruction technique. COMPARISON:  None Available. FINDINGS: Alignment: Trace anterolisthesis of C7 on T1. Mild lower thoracic levoscoliosis and partially visualized lumbar dextroscoliosis. Vertebrae: Diffuse osteopenia. Mild T9 and T12 superior endplate compression fractures without a visible acute fracture line or paraspinal hematoma or  swelling. Acute, mildly displaced fracture of the posteromedial right ninth rib. Paraspinal and other soft tissues: Small sliding hiatal hernia. Prominent gaseous distension of the proximal esophagus. Aortic and coronary atherosclerosis. Pacemaker terminating in the right ventricle. No visible pneumothorax or pleural effusion. Small stone in the gallbladder. Disc levels: Mild retropulsion of the T12 superior endplate results in at most mild  spinal stenosis. Minimal spondylosis for age. IMPRESSION: 1. Acute posterior right ninth rib fracture. 2. Mild T9 and T12 compression fractures, age indeterminate. 3.  Aortic Atherosclerosis (ICD10-I70.0). Electronically Signed   By: Dasie Hamburg M.D.   On: 12/08/2023 19:05   CT Head Wo Contrast Result Date: 12/08/2023 CLINICAL DATA:  Head trauma, minor (Age >= 65y); Ataxia, cervical trauma. Syncope and fall. History of dementia. EXAM: CT HEAD WITHOUT CONTRAST CT CERVICAL SPINE WITHOUT CONTRAST TECHNIQUE: Multidetector CT imaging of the head and cervical spine was performed following the standard protocol without intravenous contrast. Multiplanar CT image reconstructions of the cervical spine were also generated. RADIATION DOSE REDUCTION: This exam was performed according to the departmental dose-optimization program which includes automated exposure control, adjustment of the mA and/or kV according to patient size and/or use of iterative reconstruction technique. COMPARISON:  CT head 02/18/2023.  CT cervical spine 05/19/2022. FINDINGS: CT HEAD FINDINGS Brain: There is no evidence of an acute infarct, intracranial hemorrhage, mass, significant midline shift, or acute extra-axial fluid collection. There is mild-to-moderate cerebral atrophy. A small amount of slightly asymmetric low-density extra-axial fluid/CSF over the left frontal convexity is unchanged. Cerebral white matter hypodensities are unchanged and nonspecific but compatible with moderate chronic small vessel ischemic disease. Vascular: Calcified atherosclerosis at the skull base. No hyperdense vessel. Skull: No acute fracture or suspicious lesion. Sinuses/Orbits: Paranasal sinuses and mastoid air cells are clear. Bilateral cataract extraction. Other: None. CT CERVICAL SPINE FINDINGS Alignment: Mild straightening of the normal cervical lordosis. Unchanged trace anterolisthesis of C7 on T1. Skull base and vertebrae: No acute fracture or suspicious  lesion. Soft tissues and spinal canal: No prevertebral fluid or swelling. No visible canal hematoma. Disc levels:  Mild for age cervical disc and facet degeneration. Upper chest: No acute abnormality in the included lung apices. Other: Prominent gaseous distension of the proximal esophagus. IMPRESSION: 1. No evidence of acute intracranial abnormality or cervical spine fracture. 2. Moderate chronic small vessel ischemic disease. Electronically Signed   By: Dasie Hamburg M.D.   On: 12/08/2023 18:56   CT Cervical Spine Wo Contrast Result Date: 12/08/2023 CLINICAL DATA:  Head trauma, minor (Age >= 65y); Ataxia, cervical trauma. Syncope and fall. History of dementia. EXAM: CT HEAD WITHOUT CONTRAST CT CERVICAL SPINE WITHOUT CONTRAST TECHNIQUE: Multidetector CT imaging of the head and cervical spine was performed following the standard protocol without intravenous contrast. Multiplanar CT image reconstructions of the cervical spine were also generated. RADIATION DOSE REDUCTION: This exam was performed according to the departmental dose-optimization program which includes automated exposure control, adjustment of the mA and/or kV according to patient size and/or use of iterative reconstruction technique. COMPARISON:  CT head 02/18/2023.  CT cervical spine 05/19/2022. FINDINGS: CT HEAD FINDINGS Brain: There is no evidence of an acute infarct, intracranial hemorrhage, mass, significant midline shift, or acute extra-axial fluid collection. There is mild-to-moderate cerebral atrophy. A small amount of slightly asymmetric low-density extra-axial fluid/CSF over the left frontal convexity is unchanged. Cerebral white matter hypodensities are unchanged and nonspecific but compatible with moderate chronic small vessel ischemic disease. Vascular: Calcified atherosclerosis at the skull base. No hyperdense vessel. Skull:  No acute fracture or suspicious lesion. Sinuses/Orbits: Paranasal sinuses and mastoid air cells are clear.  Bilateral cataract extraction. Other: None. CT CERVICAL SPINE FINDINGS Alignment: Mild straightening of the normal cervical lordosis. Unchanged trace anterolisthesis of C7 on T1. Skull base and vertebrae: No acute fracture or suspicious lesion. Soft tissues and spinal canal: No prevertebral fluid or swelling. No visible canal hematoma. Disc levels:  Mild for age cervical disc and facet degeneration. Upper chest: No acute abnormality in the included lung apices. Other: Prominent gaseous distension of the proximal esophagus. IMPRESSION: 1. No evidence of acute intracranial abnormality or cervical spine fracture. 2. Moderate chronic small vessel ischemic disease. Electronically Signed   By: Dasie Hamburg M.D.   On: 12/08/2023 18:56   DG Chest 1 View Result Date: 12/08/2023 CLINICAL DATA:  Fall, syncope EXAM: CHEST  1 VIEW COMPARISON:  None Available. FINDINGS: LEFT-sided pacer overlies normal cardiac silhouette. No effusion, infiltrate or pneumothorax. No acute osseous abnormality. IMPRESSION: No acute cardiopulmonary process. Electronically Signed   By: Jackquline Boxer M.D.   On: 12/08/2023 18:02      IMPRESSION AND PLAN:  Assessment and Plan: * Syncope and collapse - The patient will be admitted to an observation medically monitored bed. - Will check orthostatics q 12 hours. - Will obtain a bilateral carotid Doppler and 2D echo. - The patient will be gently hydrated with IV normal saline and monitored for arrhythmias. -Differential diagnoses would include neurally mediated syncope, cardiogenic, arrhythmias related,  orthostatic hypotension and less likely hypoglycemia.    Hyponatremia - This could be contributing to generalized weakness and fall. - She will be hydrated with IV normal saline. - Will limit p.o. fluids.  Hypothyroidism - Will continue Synthroid  and check TSH level.  Dyslipidemia - Will continue statin therapy.  Dementia with behavioral disturbance (HCC) - Will continue  Namenda .  Essential hypertension - Will continue antihypertensive therapy.  Chronic atrial fibrillation with RVR (HCC) - The patient has a CHA2DS2-VASc score of 5. - She is not on anticoagulation due to fall risk.   DVT prophylaxis: Lovenox . Advanced Care Planning:  Code Status: She is DNR and DNI. Family Communication:  The plan of care was discussed in details with the patient (and family). I answered all questions. The patient agreed to proceed with the above mentioned plan. Further management will depend upon hospital course. Disposition Plan: Back to previous home environment Consults called: none. All the records are reviewed and case discussed with ED provider.  Status is: Observation  I certify that at the time of admission, it is my clinical judgment that the patient will require  hospital care extending less than 2 midnights.                            Dispo: The patient is from: Home              Anticipated d/c is to: Home              Patient currently is not medically stable to d/c.              Difficult to place patient: No  Madison DELENA Peaches M.D on 12/08/2023 at 8:50 PM  [facility]   From 7 PM-7 AM, contact night-coverage [email or web address]  CC: Primary care physician; Tisovec, Charlie ORN, MD

## 2023-12-08 NOTE — Assessment & Plan Note (Signed)
 Will continue Namenda.

## 2023-12-08 NOTE — ED Notes (Signed)
 Medtronic pacemaker present and interrogated at this time.

## 2023-12-09 DIAGNOSIS — Z882 Allergy status to sulfonamides status: Secondary | ICD-10-CM | POA: Diagnosis not present

## 2023-12-09 DIAGNOSIS — Z888 Allergy status to other drugs, medicaments and biological substances status: Secondary | ICD-10-CM | POA: Diagnosis not present

## 2023-12-09 DIAGNOSIS — Z79899 Other long term (current) drug therapy: Secondary | ICD-10-CM | POA: Diagnosis not present

## 2023-12-09 DIAGNOSIS — Z79890 Hormone replacement therapy (postmenopausal): Secondary | ICD-10-CM | POA: Diagnosis not present

## 2023-12-09 DIAGNOSIS — Z823 Family history of stroke: Secondary | ICD-10-CM | POA: Diagnosis not present

## 2023-12-09 DIAGNOSIS — E871 Hypo-osmolality and hyponatremia: Secondary | ICD-10-CM | POA: Diagnosis present

## 2023-12-09 DIAGNOSIS — F03918 Unspecified dementia, unspecified severity, with other behavioral disturbance: Secondary | ICD-10-CM | POA: Diagnosis present

## 2023-12-09 DIAGNOSIS — I10 Essential (primary) hypertension: Secondary | ICD-10-CM

## 2023-12-09 DIAGNOSIS — E039 Hypothyroidism, unspecified: Secondary | ICD-10-CM

## 2023-12-09 DIAGNOSIS — E119 Type 2 diabetes mellitus without complications: Secondary | ICD-10-CM | POA: Diagnosis present

## 2023-12-09 DIAGNOSIS — E860 Dehydration: Secondary | ICD-10-CM | POA: Diagnosis present

## 2023-12-09 DIAGNOSIS — E785 Hyperlipidemia, unspecified: Secondary | ICD-10-CM | POA: Diagnosis present

## 2023-12-09 DIAGNOSIS — Z66 Do not resuscitate: Secondary | ICD-10-CM | POA: Diagnosis present

## 2023-12-09 DIAGNOSIS — Z96641 Presence of right artificial hip joint: Secondary | ICD-10-CM | POA: Diagnosis present

## 2023-12-09 DIAGNOSIS — I482 Chronic atrial fibrillation, unspecified: Secondary | ICD-10-CM | POA: Diagnosis not present

## 2023-12-09 DIAGNOSIS — Z950 Presence of cardiac pacemaker: Secondary | ICD-10-CM | POA: Diagnosis not present

## 2023-12-09 DIAGNOSIS — E878 Other disorders of electrolyte and fluid balance, not elsewhere classified: Secondary | ICD-10-CM | POA: Diagnosis present

## 2023-12-09 DIAGNOSIS — R55 Syncope and collapse: Secondary | ICD-10-CM | POA: Diagnosis present

## 2023-12-09 DIAGNOSIS — R008 Other abnormalities of heart beat: Secondary | ICD-10-CM | POA: Diagnosis present

## 2023-12-09 DIAGNOSIS — I480 Paroxysmal atrial fibrillation: Secondary | ICD-10-CM | POA: Diagnosis present

## 2023-12-09 DIAGNOSIS — E875 Hyperkalemia: Secondary | ICD-10-CM | POA: Diagnosis present

## 2023-12-09 DIAGNOSIS — Z880 Allergy status to penicillin: Secondary | ICD-10-CM | POA: Diagnosis not present

## 2023-12-09 DIAGNOSIS — I951 Orthostatic hypotension: Secondary | ICD-10-CM | POA: Diagnosis present

## 2023-12-09 LAB — VITAMIN B12: Vitamin B-12: 621 pg/mL (ref 180–914)

## 2023-12-09 LAB — CBC
HCT: 40.7 % (ref 36.0–46.0)
Hemoglobin: 13.8 g/dL (ref 12.0–15.0)
MCH: 32.1 pg (ref 26.0–34.0)
MCHC: 33.9 g/dL (ref 30.0–36.0)
MCV: 94.7 fL (ref 80.0–100.0)
Platelets: 170 K/uL (ref 150–400)
RBC: 4.3 MIL/uL (ref 3.87–5.11)
RDW: 13.7 % (ref 11.5–15.5)
WBC: 7.9 K/uL (ref 4.0–10.5)
nRBC: 0 % (ref 0.0–0.2)

## 2023-12-09 LAB — BASIC METABOLIC PANEL WITH GFR
Anion gap: 9 (ref 5–15)
BUN: 11 mg/dL (ref 8–23)
CO2: 27 mmol/L (ref 22–32)
Calcium: 8.5 mg/dL — ABNORMAL LOW (ref 8.9–10.3)
Chloride: 98 mmol/L (ref 98–111)
Creatinine, Ser: 0.94 mg/dL (ref 0.44–1.00)
GFR, Estimated: 56 mL/min — ABNORMAL LOW (ref >60)
Glucose, Bld: 66 mg/dL — ABNORMAL LOW (ref 70–99)
Potassium: 5.4 mmol/L — ABNORMAL HIGH (ref 3.5–5.1)
Sodium: 134 mmol/L — ABNORMAL LOW (ref 135–145)

## 2023-12-09 MED ORDER — METHOCARBAMOL 500 MG PO TABS: 500.0000 mg | ORAL_TABLET | Freq: Three times a day (TID) | ORAL | Status: DC | PRN

## 2023-12-09 MED ORDER — TRAMADOL HCL 50 MG PO TABS: 50.0000 mg | ORAL_TABLET | Freq: Three times a day (TID) | ORAL | Status: DC | PRN

## 2023-12-09 MED ORDER — ACETAMINOPHEN 500 MG PO TABS
1000.0000 mg | ORAL_TABLET | Freq: Three times a day (TID) | ORAL | Status: DC
  Administered 2023-12-09 – 2023-12-12 (×8): 1000 mg via ORAL
  Filled 2023-12-09 (×8): qty 2

## 2023-12-09 NOTE — Progress Notes (Signed)
   12/09/23 1123  TOC Brief Assessment  Insurance and Status Reviewed  Patient has primary care physician Yes  Home environment has been reviewed From home alone  Prior level of function: Min assist  Prior/Current Home Services No current home services  Social Drivers of Health Review SDOH reviewed no interventions necessary  Readmission risk has been reviewed Yes  Transition of care needs no transition of care needs at this time   Transition of Care Department [facility]) has reviewed patient and no TOC needs have been identified at this time. We will continue to monitor patient advancement through interdisciplinary progression rounds. If new patient transition needs arise, please place a TOC consult.

## 2023-12-09 NOTE — Care Management Obs Status (Signed)
 MEDICARE OBSERVATION STATUS NOTIFICATION   Patient Details  Name: Kerry Carlson MRN: [id number] Date of Birth: [birth year]   Medicare Observation Status Notification Given:  Yes    Nena LITTIE Coffee, RN 12/09/2023, 12:57 PM

## 2023-12-09 NOTE — Plan of Care (Signed)
  Problem: Acute Rehab PT Goals(only PT should resolve) Goal: Pt Will Go Supine/Side To Sit Outcome: Progressing Flowsheets (Taken 12/09/2023 1153) Pt will go Supine/Side to Sit: with contact guard assist Goal: Patient Will Transfer Sit To/From Stand Outcome: Progressing Flowsheets (Taken 12/09/2023 1153) Patient will transfer sit to/from stand: with contact guard assist Goal: Pt Will Transfer Bed To Chair/Chair To Bed Outcome: Progressing Flowsheets (Taken 12/09/2023 1153) Pt will Transfer Bed to Chair/Chair to Bed: with contact guard assist Goal: Pt Will Ambulate Outcome: Progressing Flowsheets (Taken 12/09/2023 1153) Pt will Ambulate:  10 feet  with rolling walker  with contact guard assist   11:53 AM, 12/09/23 Rosaria Settler, PT, [name] [place] with [facility]

## 2023-12-09 NOTE — Progress Notes (Signed)
 Progress Note   Patient: Kerry Carlson FMW:[id number] DOB: 06-04-1925 DOA: 12/08/2023     0 DOS: the patient was seen and examined on 12/09/2023   Brief hospital admission narrative: As per H&P written by Dr. Lawence on 12/08/2023 Kerry Carlson is a 89 y.o. Caucasian female with medical history significant for type diabetes mellitus, hypertension, dyslipidemia, paroxysmal atrial fibrillation, s/p PPM, who presented to the emergency room with acute onset of syncope with subsequent fall without head injuries.  She has been having mild altered mental status since her fall.  She denies any paresthesias or focal muscle weakness.  No chest pain or palpitations.  She has been having cough productive of clear sputum without wheezing or dyspnea.  No fever or chills.  No dysuria, oliguria or hematuria or flank pain.  No tinnitus or vertigo.  She stated that she had her back but denied any current pain.  No headache or dizziness or blurred vision.  She was unconscious for about a minute. ED Course: When she came to the ER, BP was 159/110 with otherwise normal vital signs.  Labs revealed hyponatremia 128 and hypochloremia of 92 with otherwise unremarkable CMP.  Total protein was 6.1.  CBC was within normal. EKG as reviewed by me : EKG showed atrial fibrillation with controlled ventricular response of 90 with ventricular bigeminy and Q waves anteroseptal lead. Imaging: Portable chest x-ray showed no acute cardiopulmonary disease.   The patient was given 1 L bolus of IV lactated Ringer .  Pacemaker has been interrogated but the report is currently pending.  She will be admitted to a medical telemetry observation bed for further evaluation and management.  Assessment and plan Syncope and collapse - With concern for orthostatic hypotension - Will check cortisol level and to be thorough also B12 - Patient is status post pacemaker -Will follow-up 2D echo results - Continue telemetry monitoring -Repeat orthostatic  vital signs -Continue to maintain adequate hydration - Given abnormal urinalysis will check for urine culture as patient is asymptomatic to rule out the presence of UTI. - Physical therapy evaluation requested.  Chronic atrial fibrillation with RVR (HCC) - The patient has a CHA2DS2-VASc score of 5. -Not on anticoagulation secondary to fall risk - Continue atenolol  for rate control - Continue telemetry monitoring.  Hyponatremia - In the setting of dehydration most likely - Continue adequate hydration and follow electrolytes trend.  Essential hypertension - Continue treatment with atenolol  for now - Follow vital signs.  Dementia with behavioral disturbance (HCC) - Constant reorientation will be provided - Continue treatment with Namenda  - Continue supportive care. - Nightly trazodone  as needed will be provided.  Dyslipidemia - Continue treatment with Pravachol .  Hypothyroidism - TSH within normal limits - Continue Synthroid   Rib fracture and compression fracture - Most likely associated with recent fall - Will provide Tylenol  and as needed tramadol  for pain control - As needed Robaxin  has also been ordered - Follow physical therapy recommendations.  Very mild hyperkalemia - Most likely associated with hemolysis - Continue to maintain adequate hydration - Continue monitoring telemetry - Repeat base metabolic panel in AM.  Subjective:  Afebrile, no chest pain, no nausea, no vomiting.  Complaining of left-sided rib cage discomfort and also back pain.  Patient expressed no dysuria.  Able to follow commands appropriately.  Physical Exam: Vitals:   12/09/23 0730 12/09/23 0900 12/09/23 1320 12/09/23 1753  BP: (!) 162/81 (!) 179/81 (!) 132/94 138/78  Pulse: 81  73 76  Resp: 14 20  Temp:   (!) 97.5 F (36.4 C) (!) 97.4 F (36.3 C)  TempSrc:   Oral Oral  SpO2: 94%  98% 97%  Weight:      Height:       General exam: Alert, awake, hard of hearing; in no acute  distress. Respiratory system: Clear to auscultation. Respiratory effort normal.  Good saturation on room air. Cardiovascular system: Rate controlled, no rubs, no gallops, no JVD. Gastrointestinal system: Abdomen is nondistended, soft and nontender. No organomegaly or masses felt. Normal bowel sounds heard. Central nervous system: Moving 4 limbs spontaneously.  No focal neurological deficits. Extremities: No cyanosis or clubbing. Skin: No petechiae. Psychiatry: Mood & affect appropriate.    Latest data Reviewed: Basic metabolic panel: Sodium 134, potassium 5.4, chloride 98, bicarb 27, BUN 11, creatinine 0.94 and GFR 56 CBC: WBCs 7.9, hemoglobin 13.8 and platelet count 170K  Family Communication: No family at bedside.  Disposition: Status is: Inpatient Remains inpatient appropriate because: continue IV therapy  Anticipating discharge back home home health services and with family support and assistance once medically stable.  Time spent: 50 minutes  Author: Eric Nunnery, MD 12/09/2023 6:08 PM  For on call review [email or web address].

## 2023-12-09 NOTE — Evaluation (Addendum)
 Physical Therapy Evaluation Patient Details Name: NADYNE GARIEPY MRN: [id number] DOB: [birth year] Today's Date: 12/09/2023  History of Present Illness  Katharin P Corsi is a 89 y.o. Caucasian female with medical history significant for type diabetes mellitus, hypertension, dyslipidemia, paroxysmal atrial fibrillation, s/p PPM, who presented to the emergency room with acute onset of syncope with subsequent fall without head injuries.  She has been having mild altered mental status since her fall.  She denies any paresthesias or focal muscle weakness.  No chest pain or palpitations.  She has been having cough productive of clear sputum without wheezing or dyspnea.  No fever or chills.  No dysuria, oliguria or hematuria or flank pain.  No tinnitus or vertigo.  She stated that she had her back but denied any current pain.  No headache or dizziness or blurred vision.  She was unconscious for about a minute.   Clinical Impression  Patient agreeable to PT evaluation. Pt was received supine in bed and required min/mod assist with bed mobility and during transfers and short ambulation/steps at bed side with RW. Patient limited most due to fatigue and general weakness throughout. SpO2 unstable throughout, which could be due to bad pleth due to nail color. Patient reports no dizziness or discomfort during mobility. Patient has a history of dementia. Unsure of level of assist needed at home as compared to current. Patient reports this as only fall in last 6 months and denies loss of consciousness. Patient is returned to stretcher as transport team arrives to take pt to room upstairs, min assist for sit to supine. Patient will benefit from continued skilled physical therapy acutely and in recommended venue in order to address current deficits for her safety prior to returning home.        If plan is discharge home, recommend the following: A lot of help with walking and/or transfers;A lot of help with  bathing/dressing/bathroom;Assist for transportation;Supervision due to cognitive status   Can travel by private vehicle        Equipment Recommendations None recommended by PT  Recommendations for Other Services       Functional Status Assessment Patient has had a recent decline in their functional status and demonstrates the ability to make significant improvements in function in a reasonable and predictable amount of time.     Precautions / Restrictions Precautions Precautions: Fall Recall of Precautions/Restrictions: Impaired Restrictions Weight Bearing Restrictions Per Provider Order: No      Mobility  Bed Mobility Overal bed mobility: Needs Assistance Bed Mobility: Supine to Sit, Sit to Supine     Supine to sit: Min assist, Mod assist Sit to supine: Min assist, Mod assist   General bed mobility comments: HOB slightly elevated, min A for LE management and trunk elevation. Pt demo slow labored movement    Transfers Overall transfer level: Needs assistance Equipment used: Rolling walker (2 wheels) Transfers: Sit to/from Stand, Bed to chair/wheelchair/BSC Sit to Stand: Min assist   Step pivot transfers: Min assist       General transfer comment: pt demo slow labored movement, min a for steadying with RW    Ambulation/Gait Ambulation/Gait assistance: Min assist Gait Distance (Feet): 5 Feet Assistive device: Rolling walker (2 wheels) Gait Pattern/deviations: Step-to pattern, Decreased step length - right, Decreased step length - left, Trunk flexed Gait velocity: Dec     General Gait Details: Pt limited to a few short side steps at bedside due to fatigue, general weakness and due to transport arriving to take  pt upstairs as she is being admitted  Stairs            Wheelchair Mobility     Tilt Bed    Modified Rankin (Stroke Patients Only)       Balance Overall balance assessment: Needs assistance Sitting-balance support: Feet unsupported,  Bilateral upper extremity supported, No upper extremity supported Sitting balance-Leahy Scale: Fair Sitting balance - Comments: Seated EOB in ER   Standing balance support: During functional activity, Reliant on assistive device for balance, Bilateral upper extremity supported Standing balance-Leahy Scale: Fair Standing balance comment: W/ RW           Pertinent Vitals/Pain Pain Assessment Pain Assessment: No/denies pain    Home Living Family/patient expects to be discharged to:: Private residence Living Arrangements: Other relatives Available Help at Discharge: Family;Available 24 hours/day Type of Home: House Home Access: Stairs to enter Entrance Stairs-Rails: None Entrance Stairs-Number of Steps: 1 Alternate Level Stairs-Number of Steps: flight Home Layout: Two level;Able to live on main level with bedroom/bathroom;Full bath on main level Home Equipment: Standard Walker Additional Comments: per last admission's note. Pt poor historian with hx of dementia, no family present to confirm    Prior Function Prior Level of Function : Needs assist       Physical Assist : Mobility (physical) Mobility (physical): Gait;Transfers ADLs (physical): IADLs Mobility Comments: Pt reports household ambulator with RW. ADLs Comments: Pt reports independent with ADL, assist with iADLs. No family present to confirm.     Extremity/Trunk Assessment   Upper Extremity Assessment Upper Extremity Assessment: Generalized weakness (Shoulder flexion ROM ~115 deg. MMT 3+ at best.)    Lower Extremity Assessment Lower Extremity Assessment: Generalized weakness (Hip flexion, knee ext MMT 3+/5 bilat, ankle DF 4+/5 bilat.)    Cervical / Trunk Assessment Cervical / Trunk Assessment: Kyphotic  Communication   Communication Factors Affecting Communication: Other (comment) (pt HOH)    Cognition Arousal: Alert Behavior During Therapy: WFL for tasks assessed/performed   PT - Cognitive impairments:  History of cognitive impairments   PT - Cognition Comments: Alert to self, place, and month/year. Disoriented to situation. Following commands: Intact       Cueing Cueing Techniques: Verbal cues, Tactile cues, Visual cues     General Comments      Exercises     Assessment/Plan    PT Assessment Patient needs continued PT services;All further PT needs can be met in the next venue of care  PT Problem List Decreased strength;Decreased range of motion;Decreased safety awareness;Decreased cognition;Decreased activity tolerance;Decreased balance;Decreased mobility       PT Treatment Interventions DME instruction;Gait training;Stair training;Patient/family education;Functional mobility training;Therapeutic activities;Therapeutic exercise;Balance training    PT Goals (Current goals can be found in the Care Plan section)  Acute Rehab PT Goals Patient Stated Goal: return home PT Goal Formulation: With patient Time For Goal Achievement: 12/23/23 Potential to Achieve Goals: Good    Frequency Min 3X/week     Co-evaluation               AM-PAC PT 6 Clicks Mobility  Outcome Measure Help needed turning from your back to your side while in a flat bed without using bedrails?: A Lot Help needed moving from lying on your back to sitting on the side of a flat bed without using bedrails?: A Lot Help needed moving to and from a bed to a chair (including a wheelchair)?: A Lot Help needed standing up from a chair using your arms (e.g., wheelchair or bedside  chair)?: A Lot Help needed to walk in hospital room?: A Lot Help needed climbing 3-5 steps with a railing? : A Lot 6 Click Score: 12    End of Session Equipment Utilized During Treatment: Gait belt Activity Tolerance: Patient tolerated treatment well;Patient limited by fatigue Patient left: in bed;with nursing/sitter in room;Other (comment) (Pt left with transport team on stretcher) Nurse Communication: Mobility status PT Visit  Diagnosis: Unsteadiness on feet (R26.81);Other abnormalities of gait and mobility (R26.89);Muscle weakness (generalized) (M62.81);History of falling (Z91.81)    Time: 9096-9076 PT Time Calculation (min) (ACUTE ONLY): 20 min   Charges:   PT Evaluation $PT Eval Low Complexity: 1 Low PT Treatments $Therapeutic Activity: 8-22 mins PT General Charges $$ ACUTE PT VISIT: 1 Visit         11:51 AM, 12/09/23 Rosaria Settler, PT, [name] [place] with [facility]

## 2023-12-10 ENCOUNTER — Other Ambulatory Visit (HOSPITAL_COMMUNITY): Payer: Self-pay | Admitting: *Deleted

## 2023-12-10 ENCOUNTER — Inpatient Hospital Stay (HOSPITAL_COMMUNITY)

## 2023-12-10 DIAGNOSIS — R55 Syncope and collapse: Secondary | ICD-10-CM | POA: Diagnosis not present

## 2023-12-10 DIAGNOSIS — E785 Hyperlipidemia, unspecified: Secondary | ICD-10-CM | POA: Diagnosis not present

## 2023-12-10 DIAGNOSIS — F03918 Unspecified dementia, unspecified severity, with other behavioral disturbance: Secondary | ICD-10-CM | POA: Diagnosis not present

## 2023-12-10 DIAGNOSIS — I482 Chronic atrial fibrillation, unspecified: Secondary | ICD-10-CM | POA: Diagnosis not present

## 2023-12-10 LAB — URINE CULTURE

## 2023-12-10 LAB — BASIC METABOLIC PANEL WITH GFR
Anion gap: 8 (ref 5–15)
BUN: 10 mg/dL (ref 8–23)
CO2: 26 mmol/L (ref 22–32)
Calcium: 8.3 mg/dL — ABNORMAL LOW (ref 8.9–10.3)
Chloride: 103 mmol/L (ref 98–111)
Creatinine, Ser: 0.84 mg/dL (ref 0.44–1.00)
GFR, Estimated: >60 mL/min (ref >60)
Glucose, Bld: 93 mg/dL (ref 70–99)
Potassium: 3.4 mmol/L — ABNORMAL LOW (ref 3.5–5.1)
Sodium: 137 mmol/L (ref 135–145)

## 2023-12-10 LAB — MAGNESIUM: Magnesium: 2 mg/dL (ref 1.7–2.4)

## 2023-12-10 LAB — GLUCOSE, CAPILLARY: Glucose-Capillary: 83 mg/dL (ref 70–99)

## 2023-12-10 LAB — CORTISOL: Cortisol, Plasma: 8.8 ug/dL

## 2023-12-10 NOTE — Plan of Care (Signed)
   Problem: Education: Goal: Knowledge of condition and prescribed therapy will improve Outcome: Progressing   Problem: Cardiac: Goal: Will achieve and/or maintain adequate cardiac output Outcome: Progressing

## 2023-12-10 NOTE — TOC Initial Note (Signed)
 Transition of Care [facility]) - Initial/Assessment Note    Patient Details  Name: Kerry Carlson MRN: [id number] Date of Birth: 01-18-1926  Transition of Care [facility]) CM/SW Contact:    Sharlyne Stabs, RN Phone Number: 12/10/2023, 3:49 PM  Clinical Narrative:                Patient admitted with syncope and collapse. Patient live with her son and his wife. PT is recommending SNF. CM at the bedside. Patient is agreeable to recommendation. She asked to call her son for choice. [place] is agreeable and want requested for her to be closer to home. TOC working to send out [facility] for bed offers to discuss with family.    Expected Discharge Plan: Skilled Nursing Facility Barriers to Discharge: SNF Pending bed offer   Patient Goals and CMS Choice Patient states their goals for this hospitalization and ongoing recovery are:: agreeable to SNF CMS Medicare.gov Compare Post Acute Care list provided to:: Patient Choice offered to / list presented to : Patient [place] ownership interest in [facility].provided to:: Patient    Expected Discharge Plan and Services       Living arrangements for the past 2 months: Single Family Home            Prior Living Arrangements/Services Living arrangements for the past 2 months: Single Family Home Lives with:: Adult Children Patient language and need for interpreter reviewed:: Yes        Need for Family Participation in Patient Care: Yes (Comment) Care giver support system in place?: Yes (comment) Current home services: DME Criminal Activity/Legal Involvement Pertinent to Current Situation/Hospitalization: No - Comment as needed  Activities of Daily Living   ADL Screening (condition at time of admission) Independently performs ADLs?: Yes (appropriate for developmental age) Is the patient deaf or have difficulty hearing?: No Does the patient have difficulty seeing, even when wearing glasses/contacts?: No Does the patient have difficulty  concentrating, remembering, or making decisions?: No  Permission Sought/Granted            Permission granted to share info w Relationship: son     Emotional Assessment       Orientation: : Oriented to Situation, Oriented to Self, Oriented to Place Alcohol / Substance Use: Not Applicable Psych Involvement: Yes (comment)  Admission diagnosis:  Syncope and collapse [R55] Metabolic encephalopathy [G93.41] Closed fracture of one rib of right side, initial encounter [S22.31XA] Patient Active Problem List   Diagnosis Date Noted   Syncope and collapse 12/08/2023   Chronic atrial fibrillation with RVR (HCC) 12/08/2023   Hyponatremia 12/08/2023   Essential hypertension 12/08/2023   Dementia with behavioral disturbance (HCC) 12/08/2023   Dyslipidemia 12/08/2023   Hypothyroidism 12/08/2023   GERD (gastroesophageal reflux disease) 02/19/2023   Dementia (HCC) 02/19/2023   Paroxysmal atrial fibrillation (HCC) 02/19/2023   Altered mental status 02/18/2023   AKI (acute kidney injury) (HCC) 04/22/2021   Closed right hip fracture, initial encounter (HCC) 04/21/2021   Diabetes mellitus without complication (HCC) 04/21/2021   Hyperlipidemia 04/21/2021   Hypertension 04/21/2021   Thyroid  disease 04/21/2021   DNR (do not resuscitate) 04/21/2021   Acute lower UTI 04/21/2021   Encounter for care of pacemaker 05/23/2020   Pacemaker - Medtronic Azure single chamber pacemaker 07/12/2016. 05/23/2020   Second degree Mobitz II AV block 05/23/2020   Permanent atrial fibrillation (HCC) 05/23/2020   PCP:  Tisovec, Richard W, MD Pharmacy:   [facility] [phone] - [place], [place] - [address] AT [facility] [address] [place] [address] Phone: [phone] Fax: [phone]     Social Drivers of Health (SDOH) Social History: SDOH Screenings   Food Insecurity: No Food Insecurity (12/09/2023)  Housing: Low Risk  (12/09/2023)  Transportation Needs:  No Transportation Needs (12/09/2023)  Utilities: Not At Risk (12/09/2023)  Social Connections: Moderately Integrated (12/09/2023)  Tobacco Use: Low Risk  (12/08/2023)   SDOH Interventions:     Readmission Risk Interventions    12/10/2023    3:48 PM  Readmission Risk Prevention Plan  Post Dischage Appt Complete  Medication Screening Complete  Transportation Screening Complete

## 2023-12-10 NOTE — Plan of Care (Signed)
   Problem: Activity: Goal: Risk for activity intolerance will decrease Outcome: Progressing   Problem: Coping: Goal: Level of anxiety will decrease Outcome: Progressing

## 2023-12-10 NOTE — Progress Notes (Signed)
*  PRELIMINARY RESULTS* Echocardiogram 2D Echocardiogram has been performed.  Kerry Carlson 12/10/2023, 4:57 PM

## 2023-12-10 NOTE — Progress Notes (Signed)
 Approximately 12:20 Patient eating lunch. Completed another echo, patient still up and eating lunch.              Aida Pizza, RCS

## 2023-12-10 NOTE — Progress Notes (Signed)
 Physical Therapy Treatment Patient Details Name: Kerry Carlson MRN: [id number] DOB: 07-Jul-1925 Today's Date: 12/10/2023   History of Present Illness Kerry Carlson is a 89 y.o. Caucasian female with medical history significant for type diabetes mellitus, hypertension, dyslipidemia, paroxysmal atrial fibrillation, s/p PPM, who presented to the emergency room with acute onset of syncope with subsequent fall without head injuries.  She has been having mild altered mental status since her fall.  She denies any paresthesias or focal muscle weakness.  No chest pain or palpitations.  She has been having cough productive of clear sputum without wheezing or dyspnea.  No fever or chills.  No dysuria, oliguria or hematuria or flank pain.  No tinnitus or vertigo.  She stated that she had her back but denied any current pain.  No headache or dizziness or blurred vision.  She was unconscious for about a minute.    PT Comments  Pt sitting in chair, friendly and willing to participate with therapy today.  Presents with min A with transfer and gait today.  Cueing required to stand within walker as tendency to push too far infront for safety.  Increased distance with gait training with no LOB, presents with slow labored movements.  EOS pt left in chair with call bell within reach and chair alarm set.  No reports of pain.  Good O2 saturation levels during gait with range 94-97% on room air.   If plan is discharge home, recommend the following:     Can travel by private vehicle        Equipment Recommendations       Recommendations for Other Services       Precautions / Restrictions Precautions Precautions: Fall Recall of Precautions/Restrictions: Impaired Restrictions Weight Bearing Restrictions Per Provider Order: No     Mobility  Bed Mobility               General bed mobility comments: Pt in chair    Transfers Overall transfer level: Modified independent Equipment used: Rolling walker (2  wheels)   Sit to Stand: Min assist           General transfer comment: Cueing for appropriate handplacement to stand safety, stable with RW, slow labored movement    Ambulation/Gait Ambulation/Gait assistance: Min assist Gait Distance (Feet): 45 Feet Assistive device: Rolling walker (2 wheels) Gait Pattern/deviations: Step-to pattern, Decreased step length - right, Decreased step length - left, Trunk flexed       General Gait Details: Cueing to stand within walker, no LOB, slow labored movements, limited by appropriate levels of fatigue following giat   Stairs             Wheelchair Mobility     Tilt Bed    Modified Rankin (Stroke Patients Only)       Balance                                            Communication Communication Communication: No apparent difficulties  Cognition Arousal: Alert Behavior During Therapy: WFL for tasks assessed/performed   PT - Cognitive impairments: History of cognitive impairments                       PT - Cognition Comments: Alert to self, place, and month/year. Disoriented to situation.        Cueing Cueing Techniques: Verbal cues,  Tactile cues, Visual cues  Exercises General Exercises - Lower Extremity Ankle Circles/Pumps: AROM, 10 reps, Seated Long Arc Quad: AROM, 10 reps, Seated Hip Flexion/Marching: AROM, 10 reps, Seated    General Comments        Pertinent Vitals/Pain Pain Assessment Pain Assessment: No/denies pain    Home Living                          Prior Function            PT Goals (current goals can now be found in the care plan section)      Frequency           PT Plan      Co-evaluation              AM-PAC PT 6 Clicks Mobility   Outcome Measure  Help needed turning from your back to your side while in a flat bed without using bedrails?: A Little Help needed moving from lying on your back to sitting on the side of a flat bed  without using bedrails?: A Little Help needed moving to and from a bed to a chair (including a wheelchair)?: A Little Help needed standing up from a chair using your arms (e.g., wheelchair or bedside chair)?: A Little Help needed to walk in hospital room?: A Lot Help needed climbing 3-5 steps with a railing? : A Lot 6 Click Score: 16    End of Session Equipment Utilized During Treatment: Gait belt Activity Tolerance: Patient tolerated treatment well;Patient limited by fatigue Patient left: in chair;with call bell/phone within reach;with chair alarm set         Time: [phone] PT Time Calculation (min) (ACUTE ONLY): 28 min  Charges:    $Therapeutic Activity: 23-37 mins PT General Charges $$ ACUTE PT VISIT: 1 Visit                    Augustin Mclean, LPTA/CLT; CBIS [phone]  Mclean Augustin Amble 12/10/2023, 11:02 AM

## 2023-12-10 NOTE — NC FL2 (Signed)
 [place]  MEDICAID FL2 LEVEL OF CARE FORM     IDENTIFICATION  Patient Name: Kerry Carlson Birthdate: 14-Feb-1926 Sex: female Admission Date (Current Location): 12/08/2023  [facility] and [place] Number:  [employer] and Address:  [facility],  [address] [place] [address]      Provider Number: [id number]  Attending Physician Name and Address:  Ricky Fines, MD  Relative Name and Phone Number:  Parks,Chris [phone]) [phone]    Current Level of Care: Hospital Recommended Level of Care: Skilled Nursing Facility Prior Approval Number:    Date Approved/Denied:   PASRR Number: [id number]  Discharge Plan: SNF    Current Diagnoses: Patient Active Problem List   Diagnosis Date Noted   Syncope and collapse 12/08/2023   Chronic atrial fibrillation with RVR (HCC) 12/08/2023   Hyponatremia 12/08/2023   Essential hypertension 12/08/2023   Dementia with behavioral disturbance (HCC) 12/08/2023   Dyslipidemia 12/08/2023   Hypothyroidism 12/08/2023   GERD (gastroesophageal reflux disease) 02/19/2023   Dementia (HCC) 02/19/2023   Paroxysmal atrial fibrillation (HCC) 02/19/2023   Altered mental status 02/18/2023   AKI (acute kidney injury) (HCC) 04/22/2021   Closed right hip fracture, initial encounter (HCC) 04/21/2021   Diabetes mellitus without complication (HCC) 04/21/2021   Hyperlipidemia 04/21/2021   Hypertension 04/21/2021   Thyroid  disease 04/21/2021   DNR (do not resuscitate) 04/21/2021   Acute lower UTI 04/21/2021   Encounter for care of pacemaker 05/23/2020   Pacemaker - Medtronic Azure single chamber pacemaker 07/12/2016. 05/23/2020   Second degree Mobitz II AV block 05/23/2020   Permanent atrial fibrillation (HCC) 05/23/2020    Orientation RESPIRATION BLADDER Height & Weight     Self, Situation, Place  Normal External catheter Weight: 155 lb (70.3 kg) Height:  5' 6 (167.6 cm)  BEHAVIORAL SYMPTOMS/MOOD NEUROLOGICAL BOWEL NUTRITION  STATUS      Continent Diet (Heart)  AMBULATORY STATUS COMMUNICATION OF NEEDS Skin   Extensive Assist Verbally Normal                       Personal Care Assistance Level of Assistance  Bathing, Feeding, Dressing Bathing Assistance: Maximum assistance Feeding assistance: Independent Dressing Assistance: Maximum assistance     Functional Limitations Info  Sight, Speech, Hearing Sight Info: Adequate Hearing Info: Impaired Speech Info: Adequate    SPECIAL CARE FACTORS FREQUENCY  PT (By licensed PT), OT (By licensed OT)     PT Frequency: 5 x a week OT Frequency: 5 x a week            Contractures Contractures Info: Not present    Additional Factors Info  Code Status, Allergies Code Status Info: DNR-Limited Allergies Info: Aspirin, nitrofurantoin, sulfamethozazole, naproxen, penicillins           Current Medications (12/10/2023):  This is the current hospital active medication list Current Facility-Administered Medications  Medication Dose Route Frequency Provider Last Rate Last Admin   acetaminophen  (TYLENOL ) tablet 1,000 mg  1,000 mg Oral TID Ricky Fines, MD   1,000 mg at 12/10/23 0809   atenolol  (TENORMIN ) tablet 100 mg  100 mg Oral BID Mansy, Jan A, MD   100 mg at 12/10/23 0809   enoxaparin  (LOVENOX ) injection 40 mg  40 mg Subcutaneous Q24H Mansy, Jan A, MD   40 mg at 12/09/23 2105   famotidine  (PEPCID ) tablet 20 mg  20 mg Oral QPM Mansy, Jan A, MD   20 mg at 12/09/23 1801   levothyroxine  (SYNTHROID ) tablet  100 mcg  100 mcg Oral QAC breakfast Mansy, Jan A, MD   100 mcg at 12/10/23 9475   magnesium  hydroxide (MILK OF MAGNESIA) suspension 30 mL  30 mL Oral Daily PRN Mansy, Jan A, MD       memantine  (NAMENDA ) tablet 10 mg  10 mg Oral BID Mansy, Jan A, MD   10 mg at 12/10/23 0809   methocarbamol  (ROBAXIN ) tablet 500 mg  500 mg Oral Q8H PRN Ricky Fines, MD       multivitamin with minerals tablet 1 tablet  1 tablet Oral Daily Mansy, Jan A, MD   1 tablet at  12/10/23 9191   ondansetron  (ZOFRAN ) tablet 4 mg  4 mg Oral Q6H PRN Mansy, Jan A, MD       Or   ondansetron  (ZOFRAN ) injection 4 mg  4 mg Intravenous Q6H PRN Mansy, Jan A, MD       pravastatin  (PRAVACHOL ) tablet 10 mg  10 mg Oral QPM Mansy, Jan A, MD   10 mg at 12/09/23 1801   sodium chloride  flush (NS) 0.9 % injection 3 mL  3 mL Intravenous Q12H Mansy, Jan A, MD   3 mL at 12/10/23 1030   traMADol  (ULTRAM ) tablet 50 mg  50 mg Oral Q8H PRN Ricky Fines, MD       traZODone  (DESYREL ) tablet 25 mg  25 mg Oral QHS PRN Mansy, Jan A, MD   25 mg at 12/08/23 2217     Discharge Medications: Please see discharge summary for a list of discharge medications.  Relevant Imaging Results:  Relevant Lab Results:   Additional Information SS# [id number]  Noreen KATHEE Pinal, LCSWA

## 2023-12-10 NOTE — Progress Notes (Signed)
 Mobility Specialist Progress Note:    12/10/23 1544  Mobility  Activity Pivoted/transferred from chair to bed;Ambulated with assistance;Stood at bedside  Level of Assistance Minimal assist, patient does 75% or more  Assistive Device  (HHA)  Distance Ambulated (ft) 5 ft  Range of Motion/Exercises Active;All extremities  Activity Response Tolerated well  Mobility visit 1 Mobility  Mobility Specialist Start Time (ACUTE ONLY) 1528  Mobility Specialist Stop Time (ACUTE ONLY) 1544  Mobility Specialist Time Calculation (min) (ACUTE ONLY) 16 min   Pt received requesting assistance. MinA to stand and transfer with hand-held assist. Tolerated well, asx throughout. Left pt with NT, all needs met.   Sherrilee Ditty Mobility Specialist Please contact via Special educational needs teacher or  Rehab office at [phone]

## 2023-12-10 NOTE — Progress Notes (Signed)
 Progress Note   Patient: Kerry Carlson FMW:[id number] DOB: 04-16-1926 DOA: 12/08/2023     1 DOS: the patient was seen and examined on 12/10/2023   Brief hospital admission narrative: As per H&P written by Dr. Lawence on 12/08/2023 Kerry Carlson is a 89 y.o. Caucasian female with medical history significant for type diabetes mellitus, hypertension, dyslipidemia, paroxysmal atrial fibrillation, s/p PPM, who presented to the emergency room with acute onset of syncope with subsequent fall without head injuries.  She has been having mild altered mental status since her fall.  She denies any paresthesias or focal muscle weakness.  No chest pain or palpitations.  She has been having cough productive of clear sputum without wheezing or dyspnea.  No fever or chills.  No dysuria, oliguria or hematuria or flank pain.  No tinnitus or vertigo.  She stated that she had her back but denied any current pain.  No headache or dizziness or blurred vision.  She was unconscious for about a minute. ED Course: When she came to the ER, BP was 159/110 with otherwise normal vital signs.  Labs revealed hyponatremia 128 and hypochloremia of 92 with otherwise unremarkable CMP.  Total protein was 6.1.  CBC was within normal. EKG as reviewed by me : EKG showed atrial fibrillation with controlled ventricular response of 90 with ventricular bigeminy and Q waves anteroseptal lead. Imaging: Portable chest x-ray showed no acute cardiopulmonary disease.   The patient was given 1 L bolus of IV lactated Ringer .  Pacemaker has been interrogated but the report is currently pending.  She will be admitted to a medical telemetry observation bed for further evaluation and management.  Assessment and plan Syncope and collapse - With concern for orthostatic hypotension - Will check cortisol level and to be thorough also B12 - Patient is status post pacemaker -Will follow-up 2D echo results - Continue telemetry monitoring -Repeat orthostatic  vital signs -Continue to maintain adequate hydration - Given abnormal urinalysis will check for urine culture as patient is asymptomatic to rule out the presence of UTI. - Physical therapy evaluation requested.  Chronic atrial fibrillation with RVR (HCC) - The patient has a CHA2DS2-VASc score of 5. -Not on anticoagulation secondary to fall risk - Continue atenolol  for rate control - Continue telemetry monitoring.  Hyponatremia - In the setting of dehydration most likely - Continue adequate hydration and follow electrolytes trend.  Essential hypertension - Continue treatment with atenolol  for now - Follow vital signs.  Dementia with behavioral disturbance (HCC) - Constant reorientation will be provided - Continue treatment with Namenda  - Continue supportive care. - Nightly trazodone  as needed will be provided.  Dyslipidemia - Continue treatment with Pravachol .  Hypothyroidism - TSH within normal limits - Continue Synthroid   Rib fracture and compression fracture - Most likely associated with recent fall - Will provide Tylenol  and as needed tramadol  for pain control - As needed Robaxin  has also been ordered - Follow physical therapy recommendations.  Very mild hyperkalemia - Most likely associated with hemolysis - Continue to maintain adequate hydration - Continue monitoring telemetry - Repeat base metabolic panel in AM.  Subjective:  No fever, no chest pain, no nausea, no vomiting.  Reports good oral intake/appetite.  Feeling weak and is still having some pain in her back; much improved and interactive on today's exam.  Physical Exam: Vitals:   12/09/23 1957 12/10/23 0518 12/10/23 1058 12/10/23 1215  BP: (!) 165/94 (!) 146/77  138/89  Pulse: 83 75  85  Resp: 18  18  17  Temp: 97.7 F (36.5 C) 97.8 F (36.6 C)  97.6 F (36.4 C)  TempSrc:    Oral  SpO2: 98% 97% 97% 98%  Weight:      Height:       General exam: Alert, awake, reporting feeling better today; more  interactive and following commands appropriately.  No fever, no acute distress.  Generally weak. Respiratory system: Clear to auscultation. Respiratory effort normal.  Good saturation on room air. Cardiovascular system: Rate controlled, no rubs, no gallops, no JVD. Gastrointestinal system: Abdomen is nondistended, soft and nontender.  Positive bowel sounds. Central nervous system: Moving 4 limbs spontaneously.  No focal neurological deficits. Extremities: No cyanosis or clubbing. Skin: No petechiae. Psychiatry: Mood and affect appropriate.  No hallucinations or suicidal ideation currently present.   Latest data Reviewed: Basic metabolic panel: Sodium 137, potassium 3.4, chloride 103, bicarb 26, glucose 93, BUN 10, creatinine 0.84, calcium 8.3 and GFR >60 Magnesium : 2.0 AM cortisol: 8.8   Family Communication: No family at bedside.  Disposition: Status is: Inpatient Remains inpatient appropriate because: continue IV therapy  Anticipating discharge back home home health services and with family support and assistance once medically stable.  Time spent: 50 minutes  Author: Eric Nunnery, MD 12/10/2023 3:53 PM  For on call review [email or web address].

## 2023-12-11 DIAGNOSIS — R55 Syncope and collapse: Secondary | ICD-10-CM | POA: Diagnosis not present

## 2023-12-11 LAB — URINALYSIS, ROUTINE W REFLEX MICROSCOPIC
Bilirubin Urine: NEGATIVE
Glucose, UA: NEGATIVE mg/dL
Hgb urine dipstick: NEGATIVE
Ketones, ur: NEGATIVE mg/dL
Nitrite: POSITIVE — AB
Protein, ur: NEGATIVE mg/dL
Specific Gravity, Urine: 1.005 (ref 1.005–1.030)
pH: 6 (ref 5.0–8.0)

## 2023-12-11 LAB — ECHOCARDIOGRAM COMPLETE
Area-P 1/2: 5.75 cm2
Height: 66 in
S' Lateral: 2.8 cm
Weight: 2480 [oz_av]

## 2023-12-11 LAB — GLUCOSE, CAPILLARY: Glucose-Capillary: 89 mg/dL (ref 70–99)

## 2023-12-11 MED ORDER — POTASSIUM CHLORIDE CRYS ER 20 MEQ PO TBCR: 40.0000 meq | EXTENDED_RELEASE_TABLET | Freq: Once | ORAL | Status: DC

## 2023-12-11 MED ORDER — DILTIAZEM HCL 30 MG PO TABS
30.0000 mg | ORAL_TABLET | Freq: Four times a day (QID) | ORAL | Status: DC
  Administered 2023-12-11 – 2023-12-12 (×3): 30 mg via ORAL
  Filled 2023-12-11 (×3): qty 1

## 2023-12-11 MED ORDER — ORAL CARE MOUTH RINSE: 15.0000 mL | OROMUCOSAL | Status: DC | PRN

## 2023-12-11 NOTE — Plan of Care (Signed)
   Problem: Activity: Goal: Risk for activity intolerance will decrease Outcome: Progressing   Problem: Coping: Goal: Level of anxiety will decrease Outcome: Progressing

## 2023-12-11 NOTE — Progress Notes (Signed)
 Physical Therapy Treatment Patient Details Name: Kerry Carlson MRN: [id number] DOB: 1925/05/23 Today's Date: 12/11/2023   History of Present Illness Kerry Carlson is a 89 y.o. Caucasian female with medical history significant for type diabetes mellitus, hypertension, dyslipidemia, paroxysmal atrial fibrillation, s/p PPM, who presented to the emergency room with acute onset of syncope with subsequent fall without head injuries.  She has been having mild altered mental status since her fall.  She denies any paresthesias or focal muscle weakness.  No chest pain or palpitations.  She has been having cough productive of clear sputum without wheezing or dyspnea.  No fever or chills.  No dysuria, oliguria or hematuria or flank pain.  No tinnitus or vertigo.  She stated that she had her back but denied any current pain.  No headache or dizziness or blurred vision.  She was unconscious for about a minute.    PT Comments  PT sitting in chair upon therapist entrance and willing to participate today.  Pt with need for restroom break, required increased assistance to stand from low toilet surface.  Presents with stable stance with supervisoin while washing hands and min A for gait training for safety.  Pt with tendency to push walker too far forward, cueing to stand within walker.  EOS pt reports she has LBP, pain scale 6/10/. Pt left in chair with call bell within reach and chair alarm set.    If plan is discharge home, recommend the following:     Can travel by private vehicle        Equipment Recommendations       Recommendations for Other Services       Precautions / Restrictions Precautions Precautions: Fall Recall of Precautions/Restrictions: Impaired Restrictions Weight Bearing Restrictions Per Provider Order: No     Mobility  Bed Mobility               General bed mobility comments: Pt in chair    Transfers Overall transfer level: Modified independent Equipment used: Rolling  walker (2 wheels) Transfers: Sit to/from Stand Sit to Stand: Min assist, Mod assist, Contact guard assist (Increased assistance required from toilet due to low surface)           General transfer comment: Able to demonstrate appropriate hand placements to assist with standing, stable with RW.  Required mod A to stand from toilet due to low surface    Ambulation/Gait Ambulation/Gait assistance: Min assist Gait Distance (Feet): 45 Feet Assistive device: Rolling walker (2 wheels) Gait Pattern/deviations: Step-to pattern, Decreased step length - right, Decreased step length - left, Trunk flexed       General Gait Details: Cueing to stand within walker, no LOB, slow labored movements, limited by appropriate levels of fatigue and LBP following gait   Stairs             Wheelchair Mobility     Tilt Bed    Modified Rankin (Stroke Patients Only)       Balance                                            Communication    Cognition Arousal: Alert Behavior During Therapy: WFL for tasks assessed/performed   PT - Cognitive impairments: History of cognitive impairments  PT - Cognition Comments: Alert to self, place, and month/year. Disoriented to situation.        Cueing    Exercises      General Comments        Pertinent Vitals/Pain Pain Assessment Pain Assessment: 0-10 Pain Score: 0-No pain Pain Location: Reports increased LBP following gait 6/10 Pain Descriptors / Indicators: Aching Pain Intervention(s): Monitored during session, Repositioned, Limited activity within patient's tolerance    Home Living                          Prior Function            PT Goals (current goals can now be found in the care plan section)      Frequency           PT Plan      Co-evaluation              AM-PAC PT 6 Clicks Mobility   Outcome Measure  Help needed turning from your back to your  side while in a flat bed without using bedrails?: A Little Help needed moving from lying on your back to sitting on the side of a flat bed without using bedrails?: A Little Help needed moving to and from a bed to a chair (including a wheelchair)?: A Little Help needed standing up from a chair using your arms (e.g., wheelchair or bedside chair)?: A Little Help needed to walk in hospital room?: A Little Help needed climbing 3-5 steps with a railing? : A Lot 6 Click Score: 17    End of Session Equipment Utilized During Treatment: Gait belt Activity Tolerance: Patient tolerated treatment well;Patient limited by fatigue Patient left: in chair;with call bell/phone within reach;with chair alarm set Nurse Communication: Mobility status PT Visit Diagnosis: Unsteadiness on feet (R26.81);Other abnormalities of gait and mobility (R26.89);Muscle weakness (generalized) (M62.81);History of falling (Z91.81)     Time: 1340-1405 PT Time Calculation (min) (ACUTE ONLY): 25 min  Charges:    $Therapeutic Exercise: 23-37 mins PT General Charges $$ ACUTE PT VISIT: 1 Visit                     Augustin Mclean, LPTA/CLT; CBIS [phone]  Mclean Augustin Amble 12/11/2023, 3:07 PM

## 2023-12-11 NOTE — Progress Notes (Signed)
 Progress Note   Patient: Kerry Carlson FMW:[id number] DOB: Aug 10, 1925 DOA: 12/08/2023     2 DOS: the patient was seen and examined on 12/11/2023   Brief hospital admission narrative: As per H&P written by Dr. Lawence on 12/08/2023 Kerry Carlson is a 89 y.o. Caucasian female with medical history significant for type diabetes mellitus, hypertension, dyslipidemia, paroxysmal atrial fibrillation, s/p PPM, who presented to the emergency room with acute onset of syncope with subsequent fall without head injuries.  She has been having mild altered mental status since her fall.  She denies any paresthesias or focal muscle weakness.  No chest pain or palpitations.  She has been having cough productive of clear sputum without wheezing or dyspnea.  No fever or chills.  No dysuria, oliguria or hematuria or flank pain.  No tinnitus or vertigo.  She stated that she had her back but denied any current pain.  No headache or dizziness or blurred vision.  She was unconscious for about a minute. ED Course: When she came to the ER, BP was 159/110 with otherwise normal vital signs.  Labs revealed hyponatremia 128 and hypochloremia of 92 with otherwise unremarkable CMP.  Total protein was 6.1.  CBC was within normal. EKG as reviewed by me : EKG showed atrial fibrillation with controlled ventricular response of 90 with ventricular bigeminy and Q waves anteroseptal lead. Imaging: Portable chest x-ray showed no acute cardiopulmonary disease.   The patient was given 1 L bolus of IV lactated Ringer .  Pacemaker has been interrogated but the report is currently pending.  She will be admitted to a medical telemetry observation bed for further evaluation and management.    Subjective:   The patient was seen and examined this morning, stable no acute distress, hemodynamically stable No issues overnight    Assessment and plan Syncope and collapse - With concern for orthostatic hypotension -Blood pressure has  stabilized -A.m. cortisol level 8.8 -B12 normal at 621 - Troponin 11  - Patient is status post pacemaker Echo: Pending final read  - Continue telemetry monitoring -Continue to maintain adequate hydration - UA positive for moderate leukocytes nitrites, rare bacteria WBC 0-5 -Initial urine culture multiple species 1 significant growth  -PT/OT consulted for evaluation recommendations  Chronic atrial fibrillation with RVR (HCC) - The patient has a CHA2DS2-VASc score of 5. -Not on anticoagulation secondary to fall risk - Continue atenolol  for rate control (if blood pressure remains elevated with elevated heart rate, will consider Cardizem ) - Continue telemetry monitoring.  Hyponatremia -Resolved - In the setting of dehydration most likely - Encourage increased p.o. intake  Essential hypertension - Continue treatment with atenolol  for now - Follow vital signs.  Dementia with behavioral disturbance (HCC) - Constant reorientation will be provided - Continue treatment with Namenda  - Continue supportive care. - Nightly trazodone  as needed will be provided.  Dyslipidemia - Continue treatment with Pravachol . (At her age the risk outweighs the benefit-will considering discontinuing Pravachol )  Hypothyroidism - TSH within normal limits - Continue Synthroid   Rib fracture and compression fracture - Most likely associated with recent fall - Will provide Tylenol  and as needed tramadol  for pain control - As needed Robaxin  has also been ordered - Follow physical therapy recommendations.  Very mild hyperkalemia - Most likely associated with hemolysis - Continue to maintain adequate hydration - Continue monitoring telemetry - Repeat base metabolic panel in AM.  Physical Exam: Vitals:   12/10/23 1959 12/11/23 0432 12/11/23 0711 12/11/23 1238  BP: (!) 147/94  ROLLEN)  145/92 (!) 153/74  Pulse: 72  70 96  Resp: 16  17 16   Temp: (!) 97.5 F (36.4 C)  98.7 F (37.1 C) 97.7 F (36.5  C)  TempSrc: Oral  Oral Oral  SpO2: 99%  99% 96%  Weight:  64.3 kg    Height:           General:  AAO x 3,  cooperative, no distress;   HEENT:  Normocephalic, PERRL, otherwise with in Normal limits   Neuro:  CNII-XII intact. , normal motor and sensation, reflexes intact   Lungs:   Clear to auscultation BL, Respirations unlabored,  No wheezes / crackles  Cardio:    S1/S2, RRR, No murmure, No Rubs or Gallops   Abdomen:  Soft, non-tender, bowel sounds active all four quadrants, no guarding or peritoneal signs.  Muscular  skeletal:  Limited exam -global generalized weaknesses - in bed, able to move all 4 extremities,   2+ pulses,  symmetric, No pitting edema  Skin:  Dry, warm to touch, negative for any Rashes,  Wounds: Please see nursing documentation        iatry: Mood and affect appropriate.  No hallucinations or suicidal ideation currently present.   Latest data Reviewed:     Latest Ref Rng & Units 12/10/2023    4:06 AM 12/09/2023    4:46 AM 12/08/2023    5:47 PM  CMP  Glucose 70 - 99 mg/dL 93  66  875   BUN 8 - 23 mg/dL 10  11  13    Creatinine 0.44 - 1.00 mg/dL 9.15  9.05  9.08   Sodium 135 - 145 mmol/L 137  134  128   Potassium 3.5 - 5.1 mmol/L 3.4  5.4  3.7   Chloride 98 - 111 mmol/L 103  98  92   CO2 22 - 32 mmol/L 26  27  25    Calcium 8.9 - 10.3 mg/dL 8.3  8.5  8.9   Total Protein 6.5 - 8.1 g/dL   6.1   Total Bilirubin 0.0 - 1.2 mg/dL   0.8   Alkaline Phos 38 - 126 U/L   45   AST 15 - 41 U/L   19   ALT 0 - 44 U/L   16       Latest Ref Rng & Units 12/09/2023    4:46 AM 12/08/2023    5:47 PM 02/21/2023    5:01 AM  CBC  WBC 4.0 - 10.5 K/uL 7.9  9.8  7.1   Hemoglobin 12.0 - 15.0 g/dL 86.1  85.7  87.8   Hematocrit 36.0 - 46.0 % 40.7  42.1  36.7   Platelets 150 - 400 K/uL 170  209  210      Family Communication: No family at bedside.  Disposition: Status is: Inpatient Remains inpatient appropriate because: continue IV therapy  Anticipating discharge  back home home health services and with family support and assistance once medically stable.  Time spent: 50 minutes  Author: Adriana DELENA Grams, MD 12/11/2023 2:23 PM  For on call review [email or web address].

## 2023-12-11 NOTE — Plan of Care (Signed)

## 2023-12-11 NOTE — TOC Progression Note (Signed)
 Transition of Care [facility]) - Progression Note    Patient Details  Name: Kerry Carlson MRN: [id number] Date of Birth: Jan 25, 1926  [facility] CM/SW Contact  Sharlyne Stabs, RN Phone Number: 12/11/2023, 10:18 AM  Clinical Narrative:   Cm spoke with [facility] this morning to discuss bed offers. They want [facility]. Team updated, patient needs an Echo and can DC to [facility] tomorrow.     Expected Discharge Plan: Skilled Nursing Facility Barriers to Discharge: SNF Pending bed offer      Expected Discharge Plan and Services       Living arrangements for the past 2 months: Single Family Home                        Social Drivers of Health (SDOH) Interventions SDOH Screenings   Food Insecurity: No Food Insecurity (12/09/2023)  Housing: Low Risk  (12/09/2023)  Transportation Needs: No Transportation Needs (12/09/2023)  Utilities: Not At Risk (12/09/2023)  Social Connections: Moderately Integrated (12/09/2023)  Tobacco Use: Low Risk  (12/08/2023)    Readmission Risk Interventions    12/10/2023    3:48 PM  Readmission Risk Prevention Plan  Post Dischage Appt Complete  Medication Screening Complete  Transportation Screening Complete

## 2023-12-12 DIAGNOSIS — R55 Syncope and collapse: Secondary | ICD-10-CM | POA: Diagnosis not present

## 2023-12-12 LAB — GLUCOSE, CAPILLARY: Glucose-Capillary: 83 mg/dL (ref 70–99)

## 2023-12-12 LAB — BASIC METABOLIC PANEL WITH GFR
Anion gap: 8 (ref 5–15)
BUN: 11 mg/dL (ref 8–23)
CO2: 27 mmol/L (ref 22–32)
Calcium: 8.5 mg/dL — ABNORMAL LOW (ref 8.9–10.3)
Chloride: 101 mmol/L (ref 98–111)
Creatinine, Ser: 0.85 mg/dL (ref 0.44–1.00)
GFR, Estimated: >60 mL/min (ref >60)
Glucose, Bld: 83 mg/dL (ref 70–99)
Potassium: 3.9 mmol/L (ref 3.5–5.1)
Sodium: 136 mmol/L (ref 135–145)

## 2023-12-12 LAB — MAGNESIUM: Magnesium: 1.9 mg/dL (ref 1.7–2.4)

## 2023-12-12 MED ORDER — DILTIAZEM HCL ER COATED BEADS 180 MG PO CP24: 180.0000 mg | ORAL_CAPSULE | Freq: Every day | ORAL | Status: DC

## 2023-12-12 MED ORDER — ATENOLOL 25 MG PO TABS: 25.0000 mg | ORAL_TABLET | Freq: Two times a day (BID) | ORAL | Status: DC

## 2023-12-12 MED ORDER — ATENOLOL 25 MG PO TABS
50.0000 mg | ORAL_TABLET | Freq: Two times a day (BID) | ORAL | Status: DC
  Administered 2023-12-12: 50 mg via ORAL

## 2023-12-12 MED ORDER — DILTIAZEM HCL ER COATED BEADS 120 MG PO CP24
120.0000 mg | ORAL_CAPSULE | Freq: Every day | ORAL | Status: DC
  Administered 2023-12-12: 120 mg via ORAL
  Filled 2023-12-12: qty 1

## 2023-12-12 MED ORDER — DILTIAZEM HCL ER COATED BEADS 120 MG PO CP24: 120.0000 mg | ORAL_CAPSULE | Freq: Every day | ORAL | 1 refills | Status: DC

## 2023-12-12 MED ORDER — TRAZODONE HCL 50 MG PO TABS: 25.0000 mg | ORAL_TABLET | Freq: Every evening | ORAL | 0 refills | Status: DC | PRN

## 2023-12-12 MED ORDER — DILTIAZEM HCL ER COATED BEADS 120 MG PO CP24: 120.0000 mg | ORAL_CAPSULE | Freq: Every day | ORAL | Status: DC

## 2023-12-12 MED ORDER — TRAMADOL HCL 50 MG PO TABS: 50.0000 mg | ORAL_TABLET | Freq: Three times a day (TID) | ORAL | 0 refills | Status: DC | PRN

## 2023-12-12 MED ORDER — ATENOLOL 50 MG PO TABS: 50.0000 mg | ORAL_TABLET | Freq: Two times a day (BID) | ORAL | 0 refills | Status: DC

## 2023-12-12 MED ORDER — METHOCARBAMOL 500 MG PO TABS: 500.0000 mg | ORAL_TABLET | Freq: Three times a day (TID) | ORAL | 0 refills | Status: AC | PRN

## 2023-12-12 NOTE — Discharge Summary (Signed)
 Physician Discharge Summary   Patient: Kerry Carlson MRN: [id number] DOB: 1925-06-16  Admit date:     12/08/2023  Discharge date: 12/12/23  Discharge Physician: Adriana DELENA Grams   PCP: Tisovec, Richard W, MD   Recommendations at discharge:   Follow-up with PCP in 1 week Follow-up with a cardiologist in 2-4 weeks Continue current medications-subject to change for better heart rate and blood pressure control  Discharge Diagnoses: Principal Problem:   Syncope and collapse Active Problems:   Hyponatremia   Chronic atrial fibrillation with RVR (HCC)   Essential hypertension   Dementia with behavioral disturbance (HCC)   Dyslipidemia   Hypothyroidism  Kerry Carlson is a 89 y.o. Caucasian female with medical history significant for type diabetes mellitus, hypertension, dyslipidemia, paroxysmal atrial fibrillation, s/p PPM, who presented to the emergency room with acute onset of syncope with subsequent fall without head injuries.  She has been having mild altered mental status since her fall.  She denies any paresthesias or focal muscle weakness.  No chest pain or palpitations.  She has been having cough productive of clear sputum without wheezing or dyspnea.  No fever or chills.  No dysuria, oliguria or hematuria or flank pain.  No tinnitus or vertigo.  She stated that she had her back but denied any current pain.  No headache or dizziness or blurred vision.  She was unconscious for about a minute. ED Course: When she came to the ER, BP was 159/110 with otherwise normal vital signs.  Labs revealed hyponatremia 128 and hypochloremia of 92 with otherwise unremarkable CMP.  Total protein was 6.1.  CBC was within normal. EKG as reviewed by me : EKG showed atrial fibrillation with controlled ventricular response of 90 with ventricular bigeminy and Q waves anteroseptal lead. Imaging: Portable chest x-ray showed no acute cardiopulmonary disease.   The patient was given 1 L bolus of IV lactated  Ringer.  Pacemaker has been interrogated but the report is currently pending.  She will be admitted to a medical telemetry observation bed for further evaluation and management.       Subjective:  Patient was seen and examined, stable for discharge today    Syncope and collapse - With concern for orthostatic hypotension -Likely vasovagal, with new onset of atrial fibrillation -Blood pressure has stabilized -A.m. cortisol level 8.8 -B12 normal at 621 - Troponin 11   - Patient is status post pacemaker Echo: Reviewed negative any structural abnormalities   - Continue telemetry monitoring -Continue to maintain adequate hydration - UA positive for moderate leukocytes nitrites, rare bacteria WBC 0-5 -Initial urine culture multiple species 1 significant growth   -PT/OT consulted for evaluation recommendations   Chronic atrial fibrillation with RVR (HCC) - The patient has a CHA2DS2-VASc score of 5. -Not on anticoagulation secondary to fall risk (has injury with rib fractures, spinal fractures) - Continue atenolol  for rate control (added Cardizem  CD for better rate and BP control) - Continue telemetry monitoring.   Hyponatremia -Resolved - In the setting of dehydration most likely - Encourage increased p.o. intake   Essential hypertension - Continue treatment with atenolol  for now - Follow vital signs.   Dementia with behavioral disturbance (HCC) - Constant reorientation will be provided - Continue treatment with Namenda  - Continue supportive care. - Nightly trazodone  as needed will be provided.   Dyslipidemia - was on  Pravachol . (At her age the risk outweighs the benefit-will considering discontinuing Pravachol )   Hypothyroidism - TSH within normal limits - Continue Synthroid   Rib fracture and compression fracture - Most likely associated with recent fall - Will provide Tylenol  and as needed tramadol  for pain control - As needed Robaxin  has also been ordered -  Follow physical therapy recommendations.   Very mild hyperkalemia Resolved     Pain control - [place]  Controlled Substance Reporting System database was reviewed. and patient was instructed, not to drive, operate heavy machinery, perform activities at heights, swimming or participation in water activities or provide baby-sitting services while on Pain, Sleep and Anxiety Medications; until their outpatient Physician has advised to do so again. Also recommended to not to take more than prescribed Pain, Sleep and Anxiety Medications.   Disposition: Skilled nursing facility Diet recommendation:  Discharge Diet Orders (From admission, onward)     Start     Ordered   12/12/23 0000  Diet - low sodium heart healthy        12/12/23 0820           Regular diet DISCHARGE MEDICATION: Allergies as of 12/12/2023       Reactions   Aspirin Nausea And Vomiting   Nitrofurantoin Other (See Comments)   Confusion    Sulfamethoxazole-trimethoprim Other (See Comments)   Increased confusion   Naproxen Hives, Dermatitis, Rash   Penicillins Hives, Rash, Other (See Comments)   Had a really long time ago, caused hives        Medication List     STOP taking these medications    famotidine  20 MG tablet Commonly known as: PEPCID    pravastatin  10 MG tablet Commonly known as: PRAVACHOL        TAKE these medications    acetaminophen  500 MG tablet Commonly known as: TYLENOL  Take 1,000 mg by mouth 2 (two) times daily.   atenolol  50 MG tablet Commonly known as: TENORMIN  Take 1 tablet (50 mg total) by mouth 2 (two) times daily. What changed:  medication strength how much to take   diltiazem  120 MG 24 hr capsule Commonly known as: CARDIZEM  CD Take 1 capsule (120 mg total) by mouth daily.   levothyroxine  100 MCG tablet Commonly known as: SYNTHROID  Take 100 mcg by mouth daily before breakfast.   methocarbamol  500 MG tablet Commonly known as: ROBAXIN  Take 1 tablet (500 mg  total) by mouth every 8 (eight) hours as needed for up to 10 days for muscle spasms.   multivitamin with minerals Tabs tablet Take 1 tablet by mouth daily.   Namenda  10 MG tablet Generic drug: memantine  Take 10 mg by mouth 2 (two) times daily.   traMADol  50 MG tablet Commonly known as: ULTRAM  Take 1 tablet (50 mg total) by mouth every 8 (eight) hours as needed for severe pain (pain score 7-10).   traZODone  50 MG tablet Commonly known as: DESYREL  Take 0.5 tablets (25 mg total) by mouth at bedtime as needed for sleep.        Contact information for after-discharge care     Destination     [facility] .   Service: Skilled Nursing Contact information: [address] [place]  [address] [phone]                    Discharge Exam: Filed Weights   12/08/23 1719 12/11/23 0432 12/12/23 0429  Weight: 70.3 kg 64.3 kg 66.7 kg        General:  AAO x 2,  cooperative, no distress;   HEENT:  Normocephalic, PERRL, otherwise with in Normal limits   Neuro:  CNII-XII intact. , normal motor and sensation, reflexes intact   Lungs:   Clear to auscultation BL, Respirations unlabored,  No wheezes / crackles  Cardio:    S1/S2, RRR, No murmure, No Rubs or Gallops   Abdomen:  Soft, non-tender, bowel sounds active all four quadrants, no guarding or peritoneal signs.  Muscular  skeletal:  Limited exam -global generalized weaknesses - in bed, able to move all 4 extremities,   2+ pulses,  symmetric, No pitting edema  Skin:  Dry, warm to touch, negative for any Rashes,  Wounds: Please see nursing documentation       Condition at discharge: fair  The results of significant diagnostics from this hospitalization (including imaging, microbiology, ancillary and laboratory) are listed below for reference.   Imaging Studies: CT Thoracic Spine Wo Contrast Result Date: 12/08/2023 CLINICAL DATA:  Ataxia, thoracic trauma.  Syncope and fall. EXAM: CT  THORACIC SPINE WITHOUT CONTRAST TECHNIQUE: Multidetector CT images of the thoracic were obtained using the standard protocol without intravenous contrast. RADIATION DOSE REDUCTION: This exam was performed according to the departmental dose-optimization program which includes automated exposure control, adjustment of the mA and/or kV according to patient size and/or use of iterative reconstruction technique. COMPARISON:  None Available. FINDINGS: Alignment: Trace anterolisthesis of C7 on T1. Mild lower thoracic levoscoliosis and partially visualized lumbar dextroscoliosis. Vertebrae: Diffuse osteopenia. Mild T9 and T12 superior endplate compression fractures without a visible acute fracture line or paraspinal hematoma or swelling. Acute, mildly displaced fracture of the posteromedial right ninth rib. Paraspinal and other soft tissues: Small sliding hiatal hernia. Prominent gaseous distension of the proximal esophagus. Aortic and coronary atherosclerosis. Pacemaker terminating in the right ventricle. No visible pneumothorax or pleural effusion. Small stone in the gallbladder. Disc levels: Mild retropulsion of the T12 superior endplate results in at most mild spinal stenosis. Minimal spondylosis for age. IMPRESSION: 1. Acute posterior right ninth rib fracture. 2. Mild T9 and T12 compression fractures, age indeterminate. 3.  Aortic Atherosclerosis (ICD10-I70.0). Electronically Signed   By: Dasie Hamburg M.D.   On: 12/08/2023 19:05   CT Head Wo Contrast Result Date: 12/08/2023 CLINICAL DATA:  Head trauma, minor (Age >= 65y); Ataxia, cervical trauma. Syncope and fall. History of dementia. EXAM: CT HEAD WITHOUT CONTRAST CT CERVICAL SPINE WITHOUT CONTRAST TECHNIQUE: Multidetector CT imaging of the head and cervical spine was performed following the standard protocol without intravenous contrast. Multiplanar CT image reconstructions of the cervical spine were also generated. RADIATION DOSE REDUCTION: This exam was  performed according to the departmental dose-optimization program which includes automated exposure control, adjustment of the mA and/or kV according to patient size and/or use of iterative reconstruction technique. COMPARISON:  CT head 02/18/2023.  CT cervical spine 05/19/2022. FINDINGS: CT HEAD FINDINGS Brain: There is no evidence of an acute infarct, intracranial hemorrhage, mass, significant midline shift, or acute extra-axial fluid collection. There is mild-to-moderate cerebral atrophy. A small amount of slightly asymmetric low-density extra-axial fluid/CSF over the left frontal convexity is unchanged. Cerebral white matter hypodensities are unchanged and nonspecific but compatible with moderate chronic small vessel ischemic disease. Vascular: Calcified atherosclerosis at the skull base. No hyperdense vessel. Skull: No acute fracture or suspicious lesion. Sinuses/Orbits: Paranasal sinuses and mastoid air cells are clear. Bilateral cataract extraction. Other: None. CT CERVICAL SPINE FINDINGS Alignment: Mild straightening of the normal cervical lordosis. Unchanged trace anterolisthesis of C7 on T1. Skull base and vertebrae: No acute fracture or suspicious lesion. Soft tissues and spinal canal: No prevertebral fluid or swelling. No  visible canal hematoma. Disc levels:  Mild for age cervical disc and facet degeneration. Upper chest: No acute abnormality in the included lung apices. Other: Prominent gaseous distension of the proximal esophagus. IMPRESSION: 1. No evidence of acute intracranial abnormality or cervical spine fracture. 2. Moderate chronic small vessel ischemic disease. Electronically Signed   By: Dasie Hamburg M.D.   On: 12/08/2023 18:56   CT Cervical Spine Wo Contrast Result Date: 12/08/2023 CLINICAL DATA:  Head trauma, minor (Age >= 65y); Ataxia, cervical trauma. Syncope and fall. History of dementia. EXAM: CT HEAD WITHOUT CONTRAST CT CERVICAL SPINE WITHOUT CONTRAST TECHNIQUE: Multidetector CT  imaging of the head and cervical spine was performed following the standard protocol without intravenous contrast. Multiplanar CT image reconstructions of the cervical spine were also generated. RADIATION DOSE REDUCTION: This exam was performed according to the departmental dose-optimization program which includes automated exposure control, adjustment of the mA and/or kV according to patient size and/or use of iterative reconstruction technique. COMPARISON:  CT head 02/18/2023.  CT cervical spine 05/19/2022. FINDINGS: CT HEAD FINDINGS Brain: There is no evidence of an acute infarct, intracranial hemorrhage, mass, significant midline shift, or acute extra-axial fluid collection. There is mild-to-moderate cerebral atrophy. A small amount of slightly asymmetric low-density extra-axial fluid/CSF over the left frontal convexity is unchanged. Cerebral white matter hypodensities are unchanged and nonspecific but compatible with moderate chronic small vessel ischemic disease. Vascular: Calcified atherosclerosis at the skull base. No hyperdense vessel. Skull: No acute fracture or suspicious lesion. Sinuses/Orbits: Paranasal sinuses and mastoid air cells are clear. Bilateral cataract extraction. Other: None. CT CERVICAL SPINE FINDINGS Alignment: Mild straightening of the normal cervical lordosis. Unchanged trace anterolisthesis of C7 on T1. Skull base and vertebrae: No acute fracture or suspicious lesion. Soft tissues and spinal canal: No prevertebral fluid or swelling. No visible canal hematoma. Disc levels:  Mild for age cervical disc and facet degeneration. Upper chest: No acute abnormality in the included lung apices. Other: Prominent gaseous distension of the proximal esophagus. IMPRESSION: 1. No evidence of acute intracranial abnormality or cervical spine fracture. 2. Moderate chronic small vessel ischemic disease. Electronically Signed   By: Dasie Hamburg M.D.   On: 12/08/2023 18:56   DG Chest 1 View Result Date:  12/08/2023 CLINICAL DATA:  Fall, syncope EXAM: CHEST  1 VIEW COMPARISON:  None Available. FINDINGS: LEFT-sided pacer overlies normal cardiac silhouette. No effusion, infiltrate or pneumothorax. No acute osseous abnormality. IMPRESSION: No acute cardiopulmonary process. Electronically Signed   By: Jackquline Boxer M.D.   On: 12/08/2023 18:02    Microbiology: Results for orders placed or performed during the hospital encounter of 12/08/23  Urine Culture     Status: Abnormal   Collection Time: 12/08/23 10:34 PM   Specimen: Urine, Random  Result Value Ref Range Status   Specimen Description   Final    URINE, RANDOM Performed at [facility], [address], [place], [place] [address]    Special Requests   Final    NONE Performed at [facility], [address] [place], [place] [address]    Culture MULTIPLE SPECIES PRESENT, SUGGEST RECOLLECTION (A)  Final   Report Status 12/10/2023 FINAL  Final    Labs: CBC: Recent Labs  Lab 12/08/23 1747 12/09/23 0446  WBC 9.8 7.9  NEUTROABS 6.7  --   HGB 14.2 13.8  HCT 42.1 40.7  MCV 93.8 94.7  PLT 209 170   Basic Metabolic Panel: Recent Labs  Lab 12/08/23 1747 12/09/23 0446 12/10/23 0406 12/12/23 0402  NA 128* 134* 137 136  K 3.7 5.4* 3.4* 3.9  CL 92* 98 103 101  CO2 25 27 26 27   GLUCOSE 124* 66* 93 83  BUN 13 11 10 11   CREATININE 0.91 0.94 0.84 0.85  CALCIUM 8.9 8.5* 8.3* 8.5*  MG 2.0  --  2.0 1.9   Liver Function Tests: Recent Labs  Lab 12/08/23 1747  AST 19  ALT 16  ALKPHOS 45  BILITOT 0.8  PROT 6.1*  ALBUMIN 3.5   CBG: Recent Labs  Lab 12/10/23 0719 12/11/23 0720 12/12/23 0747  GLUCAP 83 89 83    Discharge time spent: greater than 30 minutes.  Signed: Adriana DELENA Grams, MD [facility] 12/12/2023

## 2023-12-12 NOTE — Progress Notes (Signed)
 Report called to [facility] . Patient to transfer via bed to [facility] room 135.Family notified and packet to accompany patient during transfer. All personal belongings sent with patient

## 2023-12-13 ENCOUNTER — Non-Acute Institutional Stay (SKILLED_NURSING_FACILITY): Payer: Self-pay | Admitting: Internal Medicine

## 2023-12-13 ENCOUNTER — Encounter: Payer: Self-pay | Admitting: Internal Medicine

## 2023-12-13 DIAGNOSIS — I4821 Permanent atrial fibrillation: Secondary | ICD-10-CM

## 2023-12-13 DIAGNOSIS — E119 Type 2 diabetes mellitus without complications: Secondary | ICD-10-CM

## 2023-12-13 DIAGNOSIS — I10 Essential (primary) hypertension: Secondary | ICD-10-CM

## 2023-12-13 DIAGNOSIS — F03918 Unspecified dementia, unspecified severity, with other behavioral disturbance: Secondary | ICD-10-CM

## 2023-12-13 DIAGNOSIS — E039 Hypothyroidism, unspecified: Secondary | ICD-10-CM

## 2023-12-13 DIAGNOSIS — R55 Syncope and collapse: Secondary | ICD-10-CM

## 2023-12-13 NOTE — Assessment & Plan Note (Addendum)
 Monitor for any postural hypotension in PT/OT.

## 2023-12-13 NOTE — Patient Instructions (Signed)
 See assessment and plan under each diagnosis in the problem list and acutely for this visit

## 2023-12-13 NOTE — Assessment & Plan Note (Signed)
Current TSH is therapeutic; no change in L-thyroxine dose indicated.

## 2023-12-13 NOTE — Assessment & Plan Note (Addendum)
 Blood pressure is slightly elevated today; average will be determined and medications titrated as clinically appropriate. Initial therapy would be low dose amlodipine with monitor of edema.

## 2023-12-13 NOTE — Assessment & Plan Note (Signed)
 12/09/2023 EKG reviewed.  Atrial fibrillation present with well-controlled ventricular rate.  On exam rate is well-controlled with minimal rhythm irregularity.  Continue to monitor.

## 2023-12-13 NOTE — Assessment & Plan Note (Signed)
 Chart review indicates that glucoses have ranged from a low of 66 up to a high of 124.  There is no A1c in Epic.  A1c can be verified by her PCP.  If she truly is diabetic, the diagnosis would be diabetes with vascular complications rather than diabetes without complication.

## 2023-12-13 NOTE — Progress Notes (Signed)
 NURSING HOME LOCATION:  [facility] ROOM NUMBER:  [id number] P  CODE STATUS:  DNR  PCP:  Charlie Reas MD  This is a comprehensive admission note to this SNFperformed on this date less than 30 days from date of admission. Included are preadmission medical/surgical history; reconciled medication list; family history; social history and comprehensive review of systems.  Corrections and additions to the records were documented. Comprehensive physical exam was also performed. Additionally a clinical summary was entered for each active diagnosis pertinent to this admission in the Problem List to enhance continuity of care.  HPI: She was hospitalized 8/17 - 12/12/2023 following  an episode of syncope and collapse resulting in a fall without head injury.  Imaging did reveal acute posterior 9th rib fracture & T9 & T12 compression fractures of indeterminate age.  Apparently there was loss of consciousness for approximately 1 minute.  The patient was reported as having mild alteration of mental status since the fall.  In the ED blood pressure was 159/110.  Sodium was 128 and total protein 6.1.  EKG revealed atrial fibrillation with controlled ventricular response.  She received 1 L bolus of IV lactated Ringer 's.  Pacemaker interrogation was pursued.  CT of the head 12/08/2023 revealed no acute injury; there was mild to moderate cerebral atrophy.  Cerebral white matter hypodensities were stable and nonspecific but compatible with moderate chronic small vessel ischemic disease.  Cortisol level was 8.8, B12 621, TSH therapeutic, and troponin 11.  There was concern for orthostatic hypotension.  She was felt to have had a vasovagal etiology in the context of new onset atrial fibrillation. While hospitalized there was mild hyperkalemia with a potassium of 5.4; at discharge this was normal at 3.9.  Mild hypocalcemia was present with final values of 8.5.  Initial GFR was 58 indicating CKD stage IIIa; final  GFR was greater than 60 indicating CKD stage II.  CBC was normal.  She has a diagnosis of diabetes; chart review reveals that glucoses have ranged from a low of 66 up to a high of 124.  There is no A1c on record. PT/OT consulted and recommended SNF placement for rehab.  Past medical and surgical history: Includes history of paroxysmal atrial fibrillation; dyslipidemia, essential hypertension, history second-degree Mobitz 2 AV block, thyroid  disease, and diabetes with vascular complications. Procedures and surgeries include abdominal hysterectomy, THA, and pacemaker insertion.  Family history: reviewed, non contributory due to advanced age.  Social history: Nondrinker; non-smoker.   Review of systems: She is unaware of any diagnoses or recommendations made in reference to the hospitalization.  She states  bend over and fell over.  She denies any cardiac or neurologic prodrome.  She stated I broke 2 ribs.  She states that she is okay.  She does have choking when I eat.  She also describes urinary frequency without other GU symptoms.  Intermittently she will have a cough with some scant yellow sputum.  Constitutional: No fever, significant weight change, fatigue  Eyes: No redness, discharge, pain, vision change ENT/mouth: No nasal congestion, purulent discharge, earache, change in hearing, sore throat  Cardiovascular: No chest pain, palpitations, paroxysmal nocturnal dyspnea, claudication, edema  Respiratory: No hemoptysis, DOE, significant snoring, apnea  Gastrointestinal: No heartburn,  abdominal pain, nausea /vomiting, rectal bleeding, melena, change in bowels Genitourinary: No dysuria, hematuria, pyuria, incontinence, nocturia Musculoskeletal: No joint stiffness, joint swelling, weakness Dermatologic: No rash, pruritus, change in appearance of skin Neurologic: No dizziness, headache, syncope, seizures, numbness, tingling Psychiatric: No  significant anxiety, depression, insomnia,  anorexia Endocrine: No change in hair/skin/nails, excessive thirst, excessive hunger  Hematologic/lymphatic: No significant bruising, lymphadenopathy, abnormal bleeding Allergy/immunology: No itchy/watery eyes, significant sneezing, urticaria, angioedema  Physical exam:  Pertinent or positive findings: She was in a wheelchair @ bedside. She is profoundly hard of hearing.  Facies are blank.  Eyebrows are essentially absent.  There is ptosis, greater on the right than the left.  There is slight exotropia of the right eye.  She has complete dentures.  Clinically there may be enlargement of the right thyroid  without nodularity.  Heart rate is slow & minimally irregular. She did not restrict inspirations despite history of rib fracture.  Abdomen is protuberant.  She has 1/2+ edema at the sock line; feet appear edematous.  Pedal pulses are not palpable.  She has interosseous wasting of the hands.  There is scattered bruising of the forearms and hands. Varus deformity of the knees is suggested.  General appearance: no acute distress, increased work of breathing is present.   Lymphatic: No lymphadenopathy about the head, neck, axilla. Eyes: No conjunctival inflammation or lid edema is present. There is no scleral icterus. Ears:  External ear exam shows no significant lesions or deformities.   Nose:  External nasal examination shows no deformity or inflammation. Nasal mucosa are pink and moist without lesions, exudates Neck:  No thyromegaly, masses, tenderness noted.    Heart:  No gallop, murmur, click, rub.  Lungs: Chest clear to auscultation without wheezes, rhonchi, rales, rubs. Abdomen: Bowel sounds are normal.  Abdomen is soft and nontender with no organomegaly, hernias, masses. GU: Deferred  Extremities:  No cyanosis, clubbing Neurologic exam: Balance, Rhomberg, finger to nose testing could not be completed due to clinical state Skin: Warm & dry w/o tenting. No significant rash.  See clinical  summary under each active problem in the Problem List with associated updated therapeutic plan

## 2023-12-13 NOTE — Assessment & Plan Note (Addendum)
 MMSE will be performed at the SNF. Trial off Namenda  deferred to her PCP.

## 2023-12-14 ENCOUNTER — Encounter: Payer: Self-pay | Admitting: Internal Medicine

## 2023-12-19 NOTE — Progress Notes (Signed)
 Remote pacemaker transmission.

## 2023-12-19 NOTE — Addendum Note (Signed)
 Addended by: VICCI SELLER A on: 12/19/2023 08:25 AM   Modules accepted: Orders

## 2023-12-27 ENCOUNTER — Non-Acute Institutional Stay (SKILLED_NURSING_FACILITY): Payer: Self-pay | Admitting: Adult Health

## 2023-12-27 ENCOUNTER — Encounter: Payer: Self-pay | Admitting: Adult Health

## 2023-12-27 DIAGNOSIS — E039 Hypothyroidism, unspecified: Secondary | ICD-10-CM | POA: Diagnosis not present

## 2023-12-27 DIAGNOSIS — I10 Essential (primary) hypertension: Secondary | ICD-10-CM | POA: Diagnosis not present

## 2023-12-27 DIAGNOSIS — I4821 Permanent atrial fibrillation: Secondary | ICD-10-CM | POA: Diagnosis not present

## 2023-12-27 NOTE — Progress Notes (Signed)
 Location:  [facility] Room Number: [id number] Place of Service:  SNF (31)   CODE STATUS: DNR  Allergies  Allergen Reactions   Aspirin Nausea And Vomiting   Nitrofurantoin Other (See Comments)    Confusion    Sulfamethoxazole-Trimethoprim Other (See Comments)    Increased confusion   Naproxen Hives, Dermatitis and Rash   Penicillins Hives, Rash and Other (See Comments)    Had a really long time ago, caused hives    Chief Complaint  Patient presents with   Acute Visit    Acute plan meeting    HPI:  We have come together for her care plan meeting. Family present. BIMS 13/15 mood 0/30 SLUMS 10/30. Does ambulate without falls. She requires moderate to dependent assist with her adl care. She is frequently incontinent of bladder and bowel. Dietary: setup for meals; regular diet appetite 1-75% weight is 143. 6 pounds. Therapy: ambulate 120 feet with rolling walker and contact guard; upper body contact guard lower body min assist; brp with supervision; steps mod assist. She will continue to be followed for her chronic illnesses including:  Permanent atrial fibrillation   Primary hypertension    Acquired hypothyroidism  Past Medical History:  Diagnosis Date   Atrial fibrillation (HCC)    Diabetes mellitus without complication (HCC)    Type 2   Hyperlipidemia    Hypertension    Pacemaker - Medtronic Azure single chamber pacemaker 07/12/2016. 05/23/2020   Second degree Mobitz II AV block 05/23/2020   Thyroid  disease    Hypothyroid    Past Surgical History:  Procedure Laterality Date   ABDOMINAL HYSTERECTOMY     PACEMAKER INSERTION  10/11/2016   Medtronic Azure MRI single-chamber pacemaker in [facility]   TOTAL HIP ARTHROPLASTY Right 04/24/2021   Procedure: PARTIAL vs. TOTAL HIP ARTHROPLASTY ANTERIOR APPROACH;  Surgeon: Vernetta Lonni GRADE, MD;  Location: [facility] OR;  Service: Orthopedics;  Laterality: Right;    Social History   Socioeconomic History   Marital status:  Widowed    Spouse name: Not on file   Number of children: 2   Years of education: Not on file   Highest education level: Not on file  Occupational History   Occupation: retired  Tobacco Use   Smoking status: Never   Smokeless tobacco: Never  Vaping Use   Vaping status: Never Used  Substance and Sexual Activity   Alcohol use: No   Drug use: Never   Sexual activity: Not Currently  Other Topics Concern   Not on file  Social History Narrative   Not on file   Social Drivers of Health   Financial Resource Strain: Not on file  Food Insecurity: No Food Insecurity (12/09/2023)   Hunger Vital Sign    Worried About Running Out of Food in the Last Year: Never true    Ran Out of Food in the Last Year: Never true  Transportation Needs: No Transportation Needs (12/09/2023)   PRAPARE - Administrator, Civil Service (Medical): No    Lack of Transportation (Non-Medical): No  Physical Activity: Not on file  Stress: Not on file  Social Connections: Moderately Integrated (12/09/2023)   Social Connection and Isolation Panel    Frequency of Communication with Friends and Family: Twice a week    Frequency of Social Gatherings with Friends and Family: Twice a week    Attends Religious Services: More than 4 times per year    Active Member of Clubs or Organizations: No  Attends Banker Meetings: 1 to 4 times per year    Marital Status: Widowed  Intimate Partner Violence: Not At Risk (12/09/2023)   Humiliation, Afraid, Rape, and Kick questionnaire    Fear of Current or Ex-Partner: No    Emotionally Abused: No    Physically Abused: No    Sexually Abused: No   Family History  Problem Relation Age of Onset   Stroke Mother    Stroke Father       VITAL SIGNS BP 124/68   Pulse 82   Temp (!) 97.5 F (36.4 C)   Resp 20   Ht 5' 6 (1.676 m)   Wt 144 lb 12.8 oz (65.7 kg)   SpO2 98%   BMI 23.37 kg/m   Outpatient Encounter Medications as of 12/27/2023  Medication Sig    acetaminophen  (TYLENOL ) 500 MG tablet Take 1,000 mg by mouth 2 (two) times daily.   atenolol  (TENORMIN ) 50 MG tablet Take 1 tablet (50 mg total) by mouth 2 (two) times daily.   diltiazem  (CARDIZEM  CD) 120 MG 24 hr capsule Take 1 capsule (120 mg total) by mouth daily.   levothyroxine  (SYNTHROID ) 100 MCG tablet Take 100 mcg by mouth daily before breakfast.    Multiple Vitamin (MULTIVITAMIN WITH MINERALS) TABS tablet Take 1 tablet by mouth daily.   NAMENDA  10 MG tablet Take 10 mg by mouth 2 (two) times daily.   traZODone  (DESYREL ) 50 MG tablet Take 0.5 tablets (25 mg total) by mouth at bedtime as needed for sleep.   traMADol  (ULTRAM ) 50 MG tablet Take 1 tablet (50 mg total) by mouth every 8 (eight) hours as needed for severe pain (pain score 7-10). (Patient not taking: Reported on 12/31/2023)   No facility-administered encounter medications on file as of 12/27/2023.     SIGNIFICANT DIAGNOSTIC EXAMS  Review of Systems  Constitutional:  Negative for malaise/fatigue.  Respiratory:  Negative for cough and shortness of breath.   Cardiovascular:  Negative for chest pain, palpitations and leg swelling.  Gastrointestinal:  Negative for abdominal pain, constipation and heartburn.  Musculoskeletal:  Negative for back pain, joint pain and myalgias.  Skin: Negative.   Neurological:  Negative for dizziness.  Psychiatric/Behavioral:  The patient is not nervous/anxious.    Physical Exam Constitutional:      General: She is not in acute distress.    Appearance: She is well-developed. She is not diaphoretic.  Neck:     Thyroid : No thyromegaly.  Cardiovascular:     Rate and Rhythm: Normal rate. Rhythm irregular.     Heart sounds: Normal heart sounds.  Pulmonary:     Effort: Pulmonary effort is normal. No respiratory distress.     Breath sounds: Normal breath sounds.  Abdominal:     General: Bowel sounds are normal. There is no distension.     Palpations: Abdomen is soft.     Tenderness: There is no  abdominal tenderness.  Musculoskeletal:        General: Normal range of motion.     Cervical back: Neck supple.     Right lower leg: Edema present.     Left lower leg: Edema present.  Lymphadenopathy:     Cervical: No cervical adenopathy.  Skin:    General: Skin is warm and dry.  Neurological:     Mental Status: She is alert. Mental status is at baseline.  Psychiatric:        Mood and Affect: Mood normal.      ASSESSMENT/ PLAN:  TODAY  Permanent atrial fibrillation Primary hypertension Acquired hypothyroidism   Will continue current medications Will continue current plan of care Will continue therapy as directed Goal of care is assisted living.   Time spent with patient: 40 minutes: goals of care; medications; therapy.    Barnie Seip NP [facility]   call [phone]

## 2023-12-31 ENCOUNTER — Encounter: Payer: Self-pay | Admitting: Adult Health

## 2023-12-31 ENCOUNTER — Non-Acute Institutional Stay (SKILLED_NURSING_FACILITY): Payer: Self-pay | Admitting: Adult Health

## 2023-12-31 DIAGNOSIS — I4821 Permanent atrial fibrillation: Secondary | ICD-10-CM | POA: Diagnosis not present

## 2023-12-31 DIAGNOSIS — R55 Syncope and collapse: Secondary | ICD-10-CM | POA: Diagnosis not present

## 2023-12-31 DIAGNOSIS — F03918 Unspecified dementia, unspecified severity, with other behavioral disturbance: Secondary | ICD-10-CM | POA: Diagnosis not present

## 2023-12-31 DIAGNOSIS — Z66 Do not resuscitate: Secondary | ICD-10-CM | POA: Diagnosis not present

## 2023-12-31 NOTE — Progress Notes (Signed)
 Location:  [facility] Room Number: 135 P Place of Service:  SNF (31)   CODE STATUS: DNR  Allergies  Allergen Reactions   Aspirin Nausea And Vomiting   Nitrofurantoin Other (See Comments)    Confusion    Sulfamethoxazole-Trimethoprim Other (See Comments)    Increased confusion   Naproxen Hives, Dermatitis and Rash   Penicillins Hives, Rash and Other (See Comments)    Had a really long time ago, caused hives    Chief Complaint  Patient presents with   Discharge Note    Discharge from [facility]     HPI:  She is being discharged to ALF with home health for pt/ot/rn/st.  She will not need any dme;her medications will be provided by the facility and will follow up with her medical provider. . She had been hospitalized for syncopal episode in which she suffered: posterior 9th rib fracture & T9 & T12 compression fractures of indeterminate age. She was admitted to this facility for short term rehab. Therapy: Therapy: ambulate 120 feet with rolling walker and contact guard; upper body contact guard lower body min assist; brp with supervision; steps mod assist.  Past Medical History:  Diagnosis Date   Atrial fibrillation (HCC)    Diabetes mellitus without complication (HCC)    Type 2   Hyperlipidemia    Hypertension    Pacemaker - Medtronic Azure single chamber pacemaker 07/12/2016. 05/23/2020   Second degree Mobitz II AV block 05/23/2020   Thyroid  disease    Hypothyroid    Past Surgical History:  Procedure Laterality Date   ABDOMINAL HYSTERECTOMY     PACEMAKER INSERTION  10/11/2016   Medtronic Azure MRI single-chamber pacemaker in [facility]   TOTAL HIP ARTHROPLASTY Right 04/24/2021   Procedure: PARTIAL vs. TOTAL HIP ARTHROPLASTY ANTERIOR APPROACH;  Surgeon: Vernetta Lonni GRADE, MD;  Location: [facility] OR;  Service: Orthopedics;  Laterality: Right;    Social History   Socioeconomic History   Marital status: Widowed    Spouse name: Not on file    Number of children: 2   Years of education: Not on file   Highest education level: Not on file  Occupational History   Occupation: retired  Tobacco Use   Smoking status: Never   Smokeless tobacco: Never  Vaping Use   Vaping status: Never Used  Substance and Sexual Activity   Alcohol use: No   Drug use: Never   Sexual activity: Not Currently  Other Topics Concern   Not on file  Social History Narrative   Not on file   Social Drivers of Health   Financial Resource Strain: Not on file  Food Insecurity: No Food Insecurity (12/09/2023)   Hunger Vital Sign    Worried About Running Out of Food in the Last Year: Never true    Ran Out of Food in the Last Year: Never true  Transportation Needs: No Transportation Needs (12/09/2023)   PRAPARE - Administrator, Civil Service (Medical): No    Lack of Transportation (Non-Medical): No  Physical Activity: Not on file  Stress: Not on file  Social Connections: Moderately Integrated (12/09/2023)   Social Connection and Isolation Panel    Frequency of Communication with Friends and Family: Twice a week    Frequency of Social Gatherings with Friends and Family: Twice a week    Attends Religious Services: More than 4 times per year    Active Member of [employer] or Organizations: No    Attends [employer]  or Organization Meetings: 1 to 4 times per year    Marital Status: Widowed  Intimate Partner Violence: Not At Risk (12/09/2023)   Humiliation, Afraid, Rape, and Kick questionnaire    Fear of Current or Ex-Partner: No    Emotionally Abused: No    Physically Abused: No    Sexually Abused: No   Family History  Problem Relation Age of Onset   Stroke Mother    Stroke Father       VITAL SIGNS BP 124/68   Pulse 82   Temp (!) 97.5 F (36.4 C)   Ht 5' 6 (1.676 m)   Wt 145 lb 3.2 oz (65.9 kg)   SpO2 98%   BMI 23.44 kg/m   Outpatient Encounter Medications as of 12/31/2023  Medication Sig   acetaminophen  (TYLENOL ) 500 MG tablet Take 1,000  mg by mouth 2 (two) times daily.   atenolol  (TENORMIN ) 50 MG tablet Take 1 tablet (50 mg total) by mouth 2 (two) times daily.   diltiazem  (CARDIZEM  CD) 120 MG 24 hr capsule Take 1 capsule (120 mg total) by mouth daily.   levothyroxine  (SYNTHROID ) 100 MCG tablet Take 100 mcg by mouth daily before breakfast.    Multiple Vitamin (MULTIVITAMIN WITH MINERALS) TABS tablet Take 1 tablet by mouth daily.   NAMENDA  10 MG tablet Take 10 mg by mouth 2 (two) times daily.   traZODone  (DESYREL ) 50 MG tablet Take 0.5 tablets (25 mg total) by mouth at bedtime as needed for sleep.   traMADol  (ULTRAM ) 50 MG tablet Take 1 tablet (50 mg total) by mouth every 8 (eight) hours as needed for severe pain (pain score 7-10). (Patient not taking: Reported on 12/31/2023)   No facility-administered encounter medications on file as of 12/31/2023.     SIGNIFICANT DIAGNOSTIC EXAMS  Review of Systems  Constitutional:  Negative for malaise/fatigue.  Respiratory:  Negative for cough and shortness of breath.   Cardiovascular:  Negative for chest pain, palpitations and leg swelling.  Gastrointestinal:  Negative for abdominal pain, constipation and heartburn.  Musculoskeletal:  Negative for back pain, joint pain and myalgias.  Skin: Negative.   Neurological:  Negative for dizziness.  Psychiatric/Behavioral:  The patient is not nervous/anxious.     Physical Exam Constitutional:      General: She is not in acute distress.    Appearance: She is well-developed. She is not diaphoretic.  Neck:     Thyroid : No thyromegaly.  Cardiovascular:     Rate and Rhythm: Normal rate. Rhythm irregular.     Heart sounds: Normal heart sounds.  Pulmonary:     Effort: Pulmonary effort is normal. No respiratory distress.     Breath sounds: Normal breath sounds.  Abdominal:     General: Bowel sounds are normal. There is no distension.     Palpations: Abdomen is soft.     Tenderness: There is no abdominal tenderness.  Musculoskeletal:         General: Normal range of motion.     Cervical back: Neck supple.     Right lower leg: No edema.     Left lower leg: No edema.  Lymphadenopathy:     Cervical: No cervical adenopathy.  Skin:    General: Skin is warm and dry.  Neurological:     Mental Status: She is alert. Mental status is at baseline.  Psychiatric:        Mood and Affect: Mood normal.       ASSESSMENT/ PLAN:   Patient is  being discharged with the following home health services:  pt/ot/st/rn: to evaluate and treat as indicated for gait balance strength adl training; medication management and cognition.   Patient is being discharged with the following durable medical equipment:  none needed   Patient has been advised to f/u with their PCP in 1-2 weeks to for a transitions of care visit.  Social services at their facility was responsible for arranging this appointment.  Pt was provided with adequate prescriptions of noncontrolled medications to reach the scheduled appointment .  For controlled substances, a limited supply was provided as appropriate for the individual patient.  If the pt normally receives these medications from a pain clinic or has a contract with another physician, these medications should be received from that clinic or physician only).    The facility will provide her medications.    Barnie Seip NP [facility]  call [phone]

## 2024-01-08 ENCOUNTER — Ambulatory Visit (INDEPENDENT_AMBULATORY_CARE_PROVIDER_SITE_OTHER): Payer: PRIVATE HEALTH INSURANCE

## 2024-01-08 DIAGNOSIS — I441 Atrioventricular block, second degree: Secondary | ICD-10-CM | POA: Diagnosis not present

## 2024-01-09 LAB — CUP PACEART REMOTE DEVICE CHECK
Battery Remaining Longevity: 104 mo
Battery Voltage: 3 V
Brady Statistic RV Percent Paced: 27.98 %
Date Time Interrogation Session: 20250917002442
Implantable Lead Connection Status: 753985
Implantable Lead Implant Date: 20180322
Implantable Lead Location: 753862
Implantable Lead Model: 5076
Implantable Pulse Generator Implant Date: 20180322
Lead Channel Impedance Value: 380 Ohm
Lead Channel Impedance Value: 437 Ohm
Lead Channel Pacing Threshold Amplitude: 0.5 V
Lead Channel Pacing Threshold Pulse Width: 0.4 ms
Lead Channel Sensing Intrinsic Amplitude: 14.125 mV
Lead Channel Sensing Intrinsic Amplitude: 14.125 mV
Lead Channel Setting Pacing Amplitude: 1.5 V
Lead Channel Setting Pacing Pulse Width: 0.4 ms
Lead Channel Setting Sensing Sensitivity: 4 mV
Zone Setting Status: 755011

## 2024-01-12 ENCOUNTER — Ambulatory Visit: Payer: Self-pay | Admitting: Cardiology

## 2024-01-14 NOTE — Progress Notes (Signed)
 Remote PPM Transmission

## 2024-04-08 ENCOUNTER — Ambulatory Visit: Payer: PRIVATE HEALTH INSURANCE

## 2024-04-08 DIAGNOSIS — I441 Atrioventricular block, second degree: Secondary | ICD-10-CM

## 2024-04-09 ENCOUNTER — Ambulatory Visit: Payer: Self-pay | Admitting: Cardiology

## 2024-04-09 LAB — CUP PACEART REMOTE DEVICE CHECK
Battery Remaining Longevity: 107 mo
Battery Voltage: 3 V
Brady Statistic RV Percent Paced: 25.93 %
Date Time Interrogation Session: 20251217052201
Implantable Lead Connection Status: 753985
Implantable Lead Implant Date: 20180322
Implantable Lead Location: 753862
Implantable Lead Model: 5076
Implantable Pulse Generator Implant Date: 20180322
Lead Channel Impedance Value: 399 Ohm
Lead Channel Impedance Value: 456 Ohm
Lead Channel Pacing Threshold Amplitude: 0.5 V
Lead Channel Pacing Threshold Pulse Width: 0.4 ms
Lead Channel Sensing Intrinsic Amplitude: 9.875 mV
Lead Channel Sensing Intrinsic Amplitude: 9.875 mV
Lead Channel Setting Pacing Amplitude: 1.5 V
Lead Channel Setting Pacing Pulse Width: 0.4 ms
Lead Channel Setting Sensing Sensitivity: 4 mV
Zone Setting Status: 755011

## 2024-04-09 NOTE — Progress Notes (Signed)
 Remote PPM Transmission

## 2024-05-06 NOTE — Discharge Summary (Signed)
 [facility]  Discharge Summary  PCP: No primary care provider on file.  Discharge Details   Admit date:        05/02/2024 Discharge date and time:    05/07/2024 Hospital LOS:    6 days  Active Hospital Problems   Diagnosis Date Noted POA   *Syncope and collapse 12/08/2023 Yes   Achalasia, esophageal 05/03/2024 Yes   Hypothyroidism 12/08/2023 Yes   Dementia (*) 02/19/2023 Yes   Paroxysmal atrial fibrillation (*) 02/19/2023 Yes   Essential hypertension 04/21/2021 Yes   Second degree Mobitz II AV block 05/23/2020 Yes    Resolved Hospital Problems  No resolved problems to display.       Current Discharge Medication List     START taking these medications      Details  fluconazole  200 mg tablet Commonly known as: DIFLUCAN   Take one tablet (200 mg dose) by mouth daily for 12 days. Quantity: 12 tablet       CONTINUE these medications which have NOT CHANGED      Details  acetaminophen  500 mg tablet Commonly known as: TYLENOL   Take two tablets (1,000 mg dose) by mouth 3 (three) times a day.   atenolol  50 mg tablet Commonly known as: TENORMIN   Take one tablet (50 mg dose) by mouth 2 (two) times daily.   diltiazem  120 MG 24 hr capsule Commonly known as: DILT-XR,DILACOR XR   Take one capsule (120 mg dose) by mouth daily.   levothyroxine  sodium 100 mcg tablet Commonly known as: SYNTHROID ,LEVOTHROID,LEVOXYL   Take one tablet (100 mcg dose) by mouth.   memantine  HCl 10 mg tablet Commonly known as: NAMENDA   Take one tablet (10 mg dose) by mouth 2 (two) times daily.   PROTONIX 40 mg tablet Generic drug: pantoprazole sodium  Take one tablet (40 mg dose) by mouth daily.      * You might also be taking other medications not listed above. If you have questions about any of your other medications, talk to the person who prescribed them or your Primary Care Provider.            Wound Orders  (From admission, onward)     Start       05/04/24 2000  WOUND CARE     Comments: Gluteal crease and bilateral buttocks  Clean with moisturizing cleanser  Apply Moisture Barrier Ointment  QShift and prn turns and peri-care  Turn Q2Hours  Ehob cushion to chair if OOB  Limit sitting to 1-2 hours at a time  Order Comments: Gluteal crease and bilateral buttocks  Clean with moisturizing cleanser  Apply Moisture Barrier Ointment  QShift and prn turns and peri-care  Turn Q2Hours  Ehob cushion to chair if OOB  Limit sitting to 1-2 hours at a time        05/04/24 2000  WOUND CARE     Comments: Bilateral heels   Apply Moisture Barrier Ointment   QShift and prn turns   Keep elevated with heels floated  Order Comments: Bilateral heels  Apply Moisture Barrier Ointment  QShift and prn turns  Keep elevated with heels floated        05/04/24 1615  WOUND CARE       Comments: Please re-consult wound care if there are changes in appearance to skin/wounds compared to the most recent wound/ostomy assessment.  Order Comments: Please re-consult wound care if there are changes in appearance to skin/wounds compared to the most recent  wound/ostomy assessment.                  Diet and Nourishment Orders (From admission, onward)     Start       05/06/24 1355  Diet Mechanically Altered/Soft & Bite Sized 6 Thin 0  (Speech therapy swallow protocol diets)  Diet effective now       Question:  Fluid consistency:  Answer:  Thin 0               DME Orders (From admission, onward)    None      Home Health Agency             Ambulatory referral to Home Health       Question Answer Comment  Home Health Face to Face Attestation Order Questions: Order questions are part of the face to face attestation   Home Health Disciplines: Physical Therapy   Home Health Disciplines: Occupational Therapy   This certifies this patient requires services in the home due to: requires physical assistance  to leave home   Reason for referral: Candidal esophagitis, difficulty with medications and ambulation                 Hospital Course  Chief Complaint: Dysphagia  Hospital Course:  Kerry Carlson is a 89 y.o. female with medical history of A/fib, HTN, hypothyroidism, dementia, hard of hearing, and SSS s/p PPM who presented for multiple syncopal episodes and massively dilated esophagus  Remainder of hospital course given in problem based format:   Candidal esophagitis Dysphagia - Esophagram 1/12 showed dilated esophagus and distal esophageal stricture - EGD showed evidence of candidal esophagitis, suspected dysmotility component, and 20 mm balloon dilation performed in distal esophagus - She was started on fluconazole , evaluated by SLP and will discharge on mechanically altered, soft and bite sized diet, with thin liquids   Syncope Elevated troponin Mobitz type II s/p PPM: - Syncopal episode appears vasovagal/orthostatic (rising up quickly in setting of poor po intake) - TTE shows LVEF 55-60% with mild valvular regurgitation, ECG showed known atrial fibrillation - Pacemaker interrogation ordered (not yet done), but patient with appropriate HR and pacemaker dysfunction seems unlikely   HTN, primary PAF: - cont atenolol / cardizem  - Appears she was previously discontinued from DOAC/anticoagulation due to fall risks, given initial presentation with syncopal episode will defer on resuming DOAC at this time   Hypothyroidism, acquired: - cont synthroid    Dementia w/o behavioral issues: - cont Namenda    Pulmonary nodules (4mm) Negative smoking history (never, low risk) - no follow up needed per Fleischner guidelines.  Physical exam: 05/07/2024 BP 142/82 (BP Location: Left Upper Arm, Patient Position: Lying)   Pulse 67   Temp 97.2 F (36.2 C) (Axillary)   Resp 14   Ht 1.665 m (5' 5.55)   Wt 54.4 kg (120 lb)   LMP  (LMP Unknown)   SpO2 94%   BMI 19.63 kg/m       Consults:  IP CONSULT TO GASTROENTEROLOGY IP CONSULT TO NUTRITION SERVICES INPATIENT CONSULT TO WOUND OSTOMY IP CONSULT TO IV TEAM IP CONSULT TO CASE MANAGEMENT, RN/SW  Labs on Discharge:  Recent Labs    Units 05/06/24 0422 05/03/24 0446  WBC thou/mcL 5.0 7.3  HGB gm/dL 87.2 87.9  HCT % 60.7 62.7  PLT thou/mcL 224 219   Recent Labs    Units 05/07/24 0228 05/06/24 0422 05/05/24 0344 05/04/24 0008 05/03/24 0445  NA mmol/L 139 135* 140 142 141  K mmol/L 4.6 4.4 3.2* 3.5* 3.2*  CL mmol/L 105 102 104 103 105  CO2 mmol/L 27 26 31 30 28   BUN mg/dL 7* 6* 5* 7* 9*  CREATININE mg/dL 9.39 9.36 9.39 9.39 9.49*  CALCIUM mg/dL 8.3* 8.5* 8.1* 8.4* 8.5*   No results for input(s): TSH, HBA1C in the last 168 hours. No results for input(s): BILITOT, AST, ALT, ALKPHOS, PROT, ALBUMIN, LDH, URICACID in the last 168 hours. No results for input(s): LABPROT, INR, PTT in the last 168 hours. No results for input(s): CHOL, LDL, HDL, TRIG in the last 168 hours.  Bedside Procedures   No orders found     Diagnostics:  EGD  Final Result  Physician: Lennart JONELLE Sanders, MD    Procedure: EGD    Indication:   89 year old female complaining of dysphagia.  Abnormal esophagram.    Postprocedure Diagnosis:   Candida esophagitis  20 mm balloon dilation performed in the distal esophagus.  Suspect dysmotility component          Recommendations:  Advance diet as tolerated.  Consider speech evaluation if not yet performed.  Will initiate fluconazole  treatment for mild Candida esophagitis which may   be contributing to symptoms.    Findings:  Scattered small adherent white mucosal plaques noted in the upper   esophagus, consistent with Candida esophagitis    No food or fluid present within the esophagus.    The GE junction appeared widely patent without evidence of significant   stricture or findings to suggest achalasia.    Mild gastritis noted.     Duodenum appeared grossly normal.    Attention again brought to the distal esophagus.  Serial balloon dilation   performed up to 15 mm, 16.5 mm, 18 mm without resistance.  Further   dilation to 19 mm and 20 mm, noted mild resistance.  Mild mucosal renting   noted.  No immediate complication noted.              Procedure Information     Procedure Details:   The patient underwent general anesthesia, which was administered by an   anesthesia professional. The patient's blood pressure, ECG, level of   consciousness, oxygen saturation, ETCO2, heart rate and respirations were   monitored throughout the procedure. The scope was introduced into the   mouth through a bite block and advanced to the third part of the duodenum.   Retroflexion was performed in the cardia, fundus and incisura. The   patient's estimated blood loss was minimal. The procedure was not   difficult. The patient tolerated the procedure well. There were no   apparent adverse events.       Medication Totals:  No administrations occurring from 0821 to 0847 on 05/05/24      Specimens: * No specimens in log *    Instrument/Scope:   * No instrument listed *        .                    Echocardiogram Complete W Enhancing Agent  Final Result  Left Ventricle: Left ventricle size is normal. Systolic function is   normal. EF: 55-60%.    Right Ventricle: Right ventricle size is normal. Systolic function is   low normal.    Mitral Valve: There is mild to moderate regurgitation.    Tricuspid Valve: There is mild to moderate regurgitation.    Pericardium: There is a trivial pericardial effusion noted.  FL Esophagram Single Contrast  Final Result  IMPRESSION: Dilated esophagus with distal esophageal stricture.    Multiple tertiary contractions.      Electronically Signed by: Grayce Linear, MD on 05/04/2024 9:29 AM        Post Hospital Care   Discharge Procedure Orders  Ambulatory  referral to Home Health  Referral Priority: Routine Referral Type: Home Health Care  Referral Reason: Evaluate and Return  Requested Specialty: Home Health Services  Number of Visits Requested: 1 Expiration Date: 11/01/24   AMB REFERRAL TO PCP  Standing Status: Future  Referral Priority: Urgent Referral Type: Consultation  Referral Reason: Evaluate and Return  Number of Visits Requested: 1 Expiration Date: 11/01/24   Diet Mechanical Soft/Easy to Chew 7  Order Comments: Mechanically Altered/6 Soft and Bite Sized Diet;Thin Liquids/0    [facility] [address] [place]  [address] [phone]    AMB REFERRAL TO PCP   Hospital follow-up     Follow-up Appointments: No future appointments.  PCP/Hospital Follow-up Action Items Assess for improvement in swallow function Assess for improved oral intake and consider orthostatic vitals in office Significant Medication Changes (See above for full changes) Fluconazole  prescribed for candidal esophagitis Protonix daily Imaging Follow-up No imaging results during this hospitalization that requires further outpatient monitoring/follow-up   Potential for Rehab: Fair Code Status: No CPR  Time spent in discharge process: 35 minutes  Electronically signed: Seena ONEIDA Beckers, MD 05/07/2024 8:57 AM  *Some images could not be shown.

## 2024-05-12 ENCOUNTER — Inpatient Hospital Stay (HOSPITAL_COMMUNITY)
Admission: EM | Admit: 2024-05-12 | Discharge: 2024-05-15 | DRG: 640 | Disposition: A | Discharge status: Skilled Nursing Facility | Source: Skilled Nursing Facility | Attending: Internal Medicine | Admitting: Internal Medicine

## 2024-05-12 ENCOUNTER — Emergency Department (HOSPITAL_COMMUNITY)

## 2024-05-12 ENCOUNTER — Other Ambulatory Visit: Payer: Self-pay

## 2024-05-12 DIAGNOSIS — I10 Essential (primary) hypertension: Secondary | ICD-10-CM | POA: Diagnosis present

## 2024-05-12 DIAGNOSIS — F0390 Unspecified dementia without behavioral disturbance: Secondary | ICD-10-CM | POA: Diagnosis not present

## 2024-05-12 DIAGNOSIS — E876 Hypokalemia: Secondary | ICD-10-CM | POA: Diagnosis present

## 2024-05-12 DIAGNOSIS — E785 Hyperlipidemia, unspecified: Secondary | ICD-10-CM | POA: Diagnosis present

## 2024-05-12 DIAGNOSIS — Z66 Do not resuscitate: Secondary | ICD-10-CM | POA: Diagnosis present

## 2024-05-12 DIAGNOSIS — Z823 Family history of stroke: Secondary | ICD-10-CM

## 2024-05-12 DIAGNOSIS — Z96641 Presence of right artificial hip joint: Secondary | ICD-10-CM | POA: Diagnosis present

## 2024-05-12 DIAGNOSIS — E119 Type 2 diabetes mellitus without complications: Secondary | ICD-10-CM | POA: Diagnosis present

## 2024-05-12 DIAGNOSIS — R55 Syncope and collapse: Secondary | ICD-10-CM | POA: Diagnosis not present

## 2024-05-12 DIAGNOSIS — E861 Hypovolemia: Principal | ICD-10-CM | POA: Diagnosis present

## 2024-05-12 DIAGNOSIS — Z681 Body mass index (BMI) 19 or less, adult: Secondary | ICD-10-CM

## 2024-05-12 DIAGNOSIS — I4821 Permanent atrial fibrillation: Secondary | ICD-10-CM | POA: Diagnosis present

## 2024-05-12 DIAGNOSIS — I959 Hypotension, unspecified: Secondary | ICD-10-CM | POA: Diagnosis present

## 2024-05-12 DIAGNOSIS — Z882 Allergy status to sulfonamides status: Secondary | ICD-10-CM

## 2024-05-12 DIAGNOSIS — Z79890 Hormone replacement therapy (postmenopausal): Secondary | ICD-10-CM

## 2024-05-12 DIAGNOSIS — Z79899 Other long term (current) drug therapy: Secondary | ICD-10-CM

## 2024-05-12 DIAGNOSIS — Z888 Allergy status to other drugs, medicaments and biological substances status: Secondary | ICD-10-CM

## 2024-05-12 DIAGNOSIS — B3781 Candidal esophagitis: Secondary | ICD-10-CM | POA: Diagnosis present

## 2024-05-12 DIAGNOSIS — Z880 Allergy status to penicillin: Secondary | ICD-10-CM

## 2024-05-12 DIAGNOSIS — I951 Orthostatic hypotension: Secondary | ICD-10-CM

## 2024-05-12 DIAGNOSIS — E43 Unspecified severe protein-calorie malnutrition: Secondary | ICD-10-CM | POA: Diagnosis present

## 2024-05-12 DIAGNOSIS — E039 Hypothyroidism, unspecified: Secondary | ICD-10-CM | POA: Diagnosis present

## 2024-05-12 DIAGNOSIS — Z950 Presence of cardiac pacemaker: Secondary | ICD-10-CM

## 2024-05-12 DIAGNOSIS — Z886 Allergy status to analgesic agent status: Secondary | ICD-10-CM

## 2024-05-12 DIAGNOSIS — E872 Acidosis, unspecified: Secondary | ICD-10-CM | POA: Diagnosis present

## 2024-05-12 LAB — URINALYSIS, ROUTINE W REFLEX MICROSCOPIC
Bilirubin Urine: NEGATIVE
Glucose, UA: NEGATIVE mg/dL
Hgb urine dipstick: NEGATIVE
Ketones, ur: 20 mg/dL — AB
Nitrite: NEGATIVE
Protein, ur: NEGATIVE mg/dL
Specific Gravity, Urine: 1.01 (ref 1.005–1.030)
pH: 5 (ref 5.0–8.0)

## 2024-05-12 LAB — COMPREHENSIVE METABOLIC PANEL WITH GFR
ALT: 21 U/L (ref 0–44)
AST: 19 U/L (ref 15–41)
Albumin: 3.7 g/dL (ref 3.5–5.0)
Alkaline Phosphatase: 64 U/L (ref 38–126)
Anion gap: 20 — ABNORMAL HIGH (ref 5–15)
BUN: 16 mg/dL (ref 8–23)
CO2: 21 mmol/L — ABNORMAL LOW (ref 22–32)
Calcium: 9.4 mg/dL (ref 8.9–10.3)
Chloride: 97 mmol/L — ABNORMAL LOW (ref 98–111)
Creatinine, Ser: 1.03 mg/dL — ABNORMAL HIGH (ref 0.44–1.00)
GFR, Estimated: 50 mL/min — ABNORMAL LOW
Glucose, Bld: 101 mg/dL — ABNORMAL HIGH (ref 70–99)
Potassium: 4.4 mmol/L (ref 3.5–5.1)
Sodium: 137 mmol/L (ref 135–145)
Total Bilirubin: 0.8 mg/dL (ref 0.0–1.2)
Total Protein: 6.4 g/dL — ABNORMAL LOW (ref 6.5–8.1)

## 2024-05-12 LAB — TROPONIN T, HIGH SENSITIVITY
Troponin T High Sensitivity: 39 ng/L — ABNORMAL HIGH (ref 0–19)
Troponin T High Sensitivity: 29 ng/L — ABNORMAL HIGH (ref 0–19)

## 2024-05-12 LAB — CBC
HCT: 43.9 % (ref 36.0–46.0)
Hemoglobin: 14.2 g/dL (ref 12.0–15.0)
MCH: 30.9 pg (ref 26.0–34.0)
MCHC: 32.3 g/dL (ref 30.0–36.0)
MCV: 95.4 fL (ref 80.0–100.0)
Platelets: 246 K/uL (ref 150–400)
RBC: 4.6 MIL/uL (ref 3.87–5.11)
RDW: 15.1 % (ref 11.5–15.5)
WBC: 10.7 K/uL — ABNORMAL HIGH (ref 4.0–10.5)
nRBC: 0 % (ref 0.0–0.2)

## 2024-05-12 LAB — PROTIME-INR
INR: 0.9 (ref 0.8–1.2)
Prothrombin Time: 12.9 s (ref 11.4–15.2)

## 2024-05-12 LAB — MAGNESIUM: Magnesium: 2.1 mg/dL (ref 1.7–2.4)

## 2024-05-12 LAB — CBG MONITORING, ED: Glucose-Capillary: 98 mg/dL (ref 70–99)

## 2024-05-12 MED ORDER — ONDANSETRON HCL 4 MG/2ML IJ SOLN: 4.0000 mg | Freq: Four times a day (QID) | INTRAMUSCULAR | Status: DC | PRN

## 2024-05-12 MED ORDER — ENOXAPARIN SODIUM 30 MG/0.3ML IJ SOSY
30.0000 mg | PREFILLED_SYRINGE | INTRAMUSCULAR | Status: DC
  Administered 2024-05-12 – 2024-05-14 (×3): 30 mg via SUBCUTANEOUS
  Filled 2024-05-12 (×3): qty 0.3

## 2024-05-12 MED ORDER — SODIUM CHLORIDE 0.9 % IV BOLUS
500.0000 mL | Freq: Once | INTRAVENOUS | Status: AC
  Administered 2024-05-12: 500 mL via INTRAVENOUS

## 2024-05-12 MED ORDER — MEMANTINE HCL 10 MG PO TABS
10.0000 mg | ORAL_TABLET | Freq: Two times a day (BID) | ORAL | Status: DC
  Administered 2024-05-12 – 2024-05-15 (×6): 10 mg via ORAL
  Filled 2024-05-12 (×6): qty 1

## 2024-05-12 MED ORDER — FLUCONAZOLE 100 MG PO TABS
200.0000 mg | ORAL_TABLET | Freq: Every day | ORAL | Status: DC
  Administered 2024-05-13 – 2024-05-15 (×3): 200 mg via ORAL
  Filled 2024-05-12 (×3): qty 2

## 2024-05-12 MED ORDER — POLYETHYLENE GLYCOL 3350 17 G PO PACK
17.0000 g | PACK | Freq: Every day | ORAL | Status: DC | PRN
  Administered 2024-05-14: 17 g via ORAL
  Filled 2024-05-12: qty 1

## 2024-05-12 MED ORDER — ONDANSETRON HCL 4 MG PO TABS: 4.0000 mg | ORAL_TABLET | Freq: Four times a day (QID) | ORAL | Status: DC | PRN

## 2024-05-12 MED ORDER — LEVOTHYROXINE SODIUM 100 MCG PO TABS
100.0000 ug | ORAL_TABLET | Freq: Every day | ORAL | Status: DC
  Administered 2024-05-13 – 2024-05-15 (×3): 100 ug via ORAL
  Filled 2024-05-12 (×3): qty 1

## 2024-05-12 MED ORDER — SODIUM CHLORIDE 0.9 % IV SOLN: INTRAVENOUS | Status: DC

## 2024-05-12 MED ORDER — ACETAMINOPHEN 650 MG RE SUPP: 650.0000 mg | Freq: Four times a day (QID) | RECTAL | Status: DC | PRN

## 2024-05-12 MED ORDER — ACETAMINOPHEN 325 MG PO TABS: 650.0000 mg | ORAL_TABLET | Freq: Four times a day (QID) | ORAL | Status: DC | PRN

## 2024-05-12 MED ORDER — TRAZODONE HCL 50 MG PO TABS: 25.0000 mg | ORAL_TABLET | Freq: Every evening | ORAL | Status: DC | PRN

## 2024-05-12 NOTE — H&P (Signed)
 " History and Physical    Kerry Carlson FMW:[id number] DOB: September 26, 1925 DOA: 05/12/2024  PCP: Vernadine Charlie ORN, MD   Patient coming from: [facility] nursing home  I have personally briefly reviewed patient's old medical records in [facility]  Chief Complaint: Passing out  HPI: Kerry Carlson is a 89 y.o. female with medical history significant for atrial fibrillation, dementia, diabetes mellitus, hypertension, hypothyroidism. Patient was brought to the ED from nursing home with reports of passing out.  Patient was sitting down in a wheelchair and slumped over.  She did not hit her head.  EMS reported blood pressure of 71/51.  My evaluation, patient tells me she does not remember passing out.  She reports dizziness over the past several days to weeks.  She denies vomiting, no diarrhea.  She denies urinary symptoms. Recently presented at an outside ED for syncope also, and was transferred to [facility] where she was diagnosed with candidal esophagitis.  ED Course: Temperature 97.5.  Heart rate 60s.  Respirate rate 15-20.  Blood pressure systolic 116-130.  Normal O2 sats. Troponin 39 > 29. EKG shows atrial fibrillation. 1 L bolus given. Pacemaker interrogated in the ED, no events. Patient unable to ambulate in the ED, was able to stand but not able to walk-felt weak and like she was going to faint.  Review of Systems: As per HPI all other systems reviewed and negative.  Past Medical History:  Diagnosis Date   Atrial fibrillation (HCC)    Diabetes mellitus without complication (HCC)    Type 2   Hyperlipidemia    Hypertension    Pacemaker - Medtronic Azure single chamber pacemaker 07/12/2016. 05/23/2020   Second degree Mobitz II AV block 05/23/2020   Thyroid  disease    Hypothyroid    Past Surgical History:  Procedure Laterality Date   ABDOMINAL HYSTERECTOMY     PACEMAKER INSERTION  10/11/2016   Medtronic Azure MRI single-chamber pacemaker in [facility]   TOTAL HIP ARTHROPLASTY Right  04/24/2021   Procedure: PARTIAL vs. TOTAL HIP ARTHROPLASTY ANTERIOR APPROACH;  Surgeon: Vernetta Lonni GRADE, MD;  Location: [facility] OR;  Service: Orthopedics;  Laterality: Right;     reports that she has never smoked. She has never used smokeless tobacco. She reports that she does not drink alcohol and does not use drugs.  Allergies[1]  Family History  Problem Relation Age of Onset   Stroke Mother    Stroke Father     Prior to Admission medications  Medication Sig Start Date End Date Taking? Authorizing Provider  acetaminophen  (TYLENOL ) 500 MG tablet Take 1,000 mg by mouth 2 (two) times daily. 04/26/21   [provider]  atenolol  (TENORMIN ) 50 MG tablet Take 1 tablet (50 mg total) by mouth 2 (two) times daily. 12/12/23 01/11/24  Willette Adriana LABOR, MD  diltiazem  (CARDIZEM  CD) 120 MG 24 hr capsule Take 1 capsule (120 mg total) by mouth daily. 12/12/23 01/11/24  Willette Adriana LABOR, MD  levothyroxine  (SYNTHROID ) 100 MCG tablet Take 100 mcg by mouth daily before breakfast.     [provider]  Multiple Vitamin (MULTIVITAMIN WITH MINERALS) TABS tablet Take 1 tablet by mouth daily. 04/26/21   Gonfa, Taye T, MD  NAMENDA  10 MG tablet Take 10 mg by mouth 2 (two) times daily.    [provider]  traMADol  (ULTRAM ) 50 MG tablet Take 1 tablet (50 mg total) by mouth every 8 (eight) hours as needed for severe pain (pain score 7-10). Patient not taking: Reported on 12/31/2023  12/12/23   Willette Adriana LABOR, MD  traZODone  (DESYREL ) 50 MG tablet Take 0.5 tablets (25 mg total) by mouth at bedtime as needed for sleep. 12/12/23 01/11/24  Willette Adriana LABOR, MD    Physical Exam: Vitals:   05/12/24 1324 05/12/24 1330 05/12/24 1345 05/12/24 1415  BP:  (!) 120/54 (!) 129/55 130/64  Pulse:  64 65 62  Resp:  20 15 15   Temp: (!) 97.5 F (36.4 C)     TempSrc: Axillary     SpO2:  96% 98% 97%  Weight:        Constitutional: NAD, calm, comfortable Vitals:   05/12/24 1324 05/12/24 1330 05/12/24  1345 05/12/24 1415  BP:  (!) 120/54 (!) 129/55 130/64  Pulse:  64 65 62  Resp:  20 15 15   Temp: (!) 97.5 F (36.4 C)     TempSrc: Axillary     SpO2:  96% 98% 97%  Weight:       Eyes: PERRL, lids and conjunctivae normal ENMT: Mucous membranes are moist.  Neck: normal, supple, no masses, no thyromegaly Respiratory: clear to auscultation bilaterally, no wheezing, no crackles. Normal respiratory effort. No accessory muscle use.  Cardiovascular: Regular rate and rhythm, no murmurs / rubs / gallops. No extremity edema.  Extremities warm Abdomen: no tenderness, no masses palpated. No hepatosplenomegaly.   Musculoskeletal: no clubbing / cyanosis. No joint deformity upper and lower extremities.  Skin: no rashes, lesions, ulcers. No induration Neurologic: No facial asymmetry, moves extremity spontaneously, speech fluent Psychiatric: Normal judgment and insight. Alert and oriented x 3. Normal mood.   Labs on Admission: I have personally reviewed following labs and imaging studies  CBC: Recent Labs  Lab 05/12/24 1318  WBC 10.7*  HGB 14.2  HCT 43.9  MCV 95.4  PLT 246   Basic Metabolic Panel: Recent Labs  Lab 05/12/24 1318  NA 137  K 4.4  CL 97*  CO2 21*  GLUCOSE 101*  BUN 16  CREATININE 1.03*  CALCIUM 9.4  MG 2.1   GFR: Estimated Creatinine Clearance: 27.4 mL/min (A) (by C-G formula based on SCr of 1.03 mg/dL (H)). Liver Function Tests: Recent Labs  Lab 05/12/24 1318  AST 19  ALT 21  ALKPHOS 64  BILITOT 0.8  PROT 6.4*  ALBUMIN 3.7   Coagulation Profile: Recent Labs  Lab 05/12/24 1318  INR 0.9   CBG: Recent Labs  Lab 05/12/24 1306  GLUCAP 98   Urine analysis:    Component Value Date/Time   COLORURINE YELLOW 05/12/2024 1658   APPEARANCEUR CLEAR 05/12/2024 1658   LABSPEC 1.010 05/12/2024 1658   PHURINE 5.0 05/12/2024 1658   GLUCOSEU NEGATIVE 05/12/2024 1658   HGBUR NEGATIVE 05/12/2024 1658   BILIRUBINUR NEGATIVE 05/12/2024 1658   KETONESUR 20 (A)  05/12/2024 1658   PROTEINUR NEGATIVE 05/12/2024 1658   NITRITE NEGATIVE 05/12/2024 1658   LEUKOCYTESUR SMALL (A) 05/12/2024 1658    Radiological Exams on Admission: DG Chest Portable 1 View Result Date: 05/12/2024 EXAM: 1 VIEW(S) XRAY OF THE CHEST 05/12/2024 01:37:00 PM COMPARISON: 12/08/2023 CLINICAL HISTORY: syncope, pacemaker FINDINGS: LUNGS AND PLEURA: Mildly lower lung volumes. No focal pulmonary opacity. No pleural effusion. No pneumothorax. HEART AND MEDIASTINUM: Stable left chest single lead cardiac pacemaker. Calcified aortic atherosclerosis. BONES AND SOFT TISSUES: Dextroscoliosis and degenerative changes in spine. IMPRESSION: 1. No acute cardiopulmonary abnormality. 2. Stable left chest single lead cardiac pacemaker. Electronically signed by: Norman Gatlin MD 05/12/2024 01:53 PM EST RP Workstation: [id number]    EKG: Independently reviewed.  Atrial fibrillation, rate 69, QTc 500.  Assessment/Plan Principal Problem:   Syncope Active Problems:   Pacemaker - Medtronic Azure single chamber pacemaker 07/12/2016.   Permanent atrial fibrillation (HCC)   Diabetes mellitus without complication (HCC)   Hypertension   DNR (do not resuscitate)   Dementia (HCC)   Assessment and Plan:  Syncope- EMS reports blood pressure 71/51 at scene.  Blood pressure improved here.  Recent echo 12/10/2023 -EF of 50 to 55%.  Hospitalized at [facility] 1/10-1/15 also for syncopal episode, not orthostatic secondary to poor oral intake 2/2 dysphagia, EGD revealed Candida esophagitis.  Started on fluconazole  on discharge. - 1 L bolus given, continue N/s 75 cc/h for 15 hours - Reassess in a.m. - Hold atenolol , Cardizem   Esophageal candidiasis-per above.  She was to complete 12 days of fluconazole . - Resume fluconazole   Diabetes mellitus -diet controlled.  A1c 5.7.  Atrial fibrillation-rate controlled.  Anticoagulation discontinued due to fall risk. - Atenolol  and Cardizem  held temporarily due to initial  hypotension and syncope  Dementia-  Stable.  Nursing home resident. - Resume Namenda   Hypertension-stable. -Holding atenolol  and Cardizem  for per above    DVT prophylaxis: Lovenox  Code Status:  DNR-the bedside DNR form at bedside. Family Communication: None at bedside Disposition Plan: ~ 1- 2 days Consults called: None Admission status:  Obs Tele   Author: Tully FORBES Carwin, MD 05/12/2024 8:09 PM  For on call review [email or web address].      [1]  Allergies Allergen Reactions   Aspirin Nausea And Vomiting   Nitrofurantoin Other (See Comments)    Confusion    Sulfamethoxazole-Trimethoprim Other (See Comments)    Increased confusion   Naproxen Hives, Dermatitis and Rash   Penicillins Hives, Rash and Other (See Comments)    Had a really long time ago, caused hives   "

## 2024-05-12 NOTE — ED Notes (Signed)
 Attempted to ambulate pt. Pt was able to stand but not able to take steps without feeling weak or feeling as if she was going to faint. EDP aware.

## 2024-05-12 NOTE — ED Triage Notes (Signed)
 Pt BIB [facility] for syncope at [facility]. Pts BP with EMS 71/51 sitting. Pt endorses feeling weak and tired. Pt did not hit her head when she passed out.

## 2024-05-12 NOTE — ED Provider Notes (Signed)
 " [place] EMERGENCY DEPARTMENT AT [facility] Provider Note   CSN: [id number] Arrival date & time: 05/12/24  1236     Patient presents with: Loss of Consciousness   Kerry Carlson is a 89 y.o. female.   HPI 89 year old female presents with syncope.  She was reportedly in her wheelchair and slumped over/passed out.  No injuries.  Patient states she has been feeling lightheaded for the past few weeks as well as having generalized fatigue/weakness.  No focal weakness or headaches.  No chest pain, shortness of breath.  She recently presented to an outside ED for similar syncope and ended up being transferred to [facility], where she had an esophageal evaluation and was diagnosed with candidal esophagitis.  She reports poor p.o. intake and appetite.  She apparently had blood pressure of 71/51 with EMS while seated.  Prior to Admission medications  Medication Sig Start Date End Date Taking? Authorizing Provider  acetaminophen  (TYLENOL ) 500 MG tablet Take 1,000 mg by mouth 2 (two) times daily. 04/26/21   [provider]  atenolol  (TENORMIN ) 50 MG tablet Take 1 tablet (50 mg total) by mouth 2 (two) times daily. 12/12/23 01/11/24  Willette Adriana LABOR, MD  diltiazem  (CARDIZEM  CD) 120 MG 24 hr capsule Take 1 capsule (120 mg total) by mouth daily. 12/12/23 01/11/24  Willette Adriana LABOR, MD  levothyroxine  (SYNTHROID ) 100 MCG tablet Take 100 mcg by mouth daily before breakfast.     [provider]  Multiple Vitamin (MULTIVITAMIN WITH MINERALS) TABS tablet Take 1 tablet by mouth daily. 04/26/21   Gonfa, Taye T, MD  NAMENDA  10 MG tablet Take 10 mg by mouth 2 (two) times daily.    [provider]  traMADol  (ULTRAM ) 50 MG tablet Take 1 tablet (50 mg total) by mouth every 8 (eight) hours as needed for severe pain (pain score 7-10). Patient not taking: Reported on 12/31/2023 12/12/23   Willette Adriana LABOR, MD  traZODone  (DESYREL ) 50 MG tablet Take 0.5 tablets (25 mg total) by mouth at  bedtime as needed for sleep. 12/12/23 01/11/24  Willette Adriana LABOR, MD    Allergies: Aspirin, Nitrofurantoin, Sulfamethoxazole-trimethoprim, Naproxen, and Penicillins    Review of Systems  Constitutional:  Positive for appetite change and fatigue.  Respiratory:  Negative for shortness of breath.   Cardiovascular:  Negative for chest pain.  Neurological:  Positive for syncope and light-headedness. Negative for headaches.    Updated Vital Signs BP 130/64   Pulse 62   Temp (!) 97.5 F (36.4 C) (Axillary)   Resp 15   Wt 54.4 kg   SpO2 97%   BMI 19.37 kg/m   Physical Exam Vitals and nursing note reviewed.  Constitutional:      General: She is not in acute distress.    Appearance: She is well-developed. She is not ill-appearing or diaphoretic.  HENT:     Head: Normocephalic and atraumatic.     Mouth/Throat:     Mouth: Mucous membranes are dry.  Eyes:     Extraocular Movements: Extraocular movements intact.     Pupils: Pupils are equal, round, and reactive to light.  Cardiovascular:     Rate and Rhythm: Regular rhythm. Bradycardia present.     Heart sounds: Normal heart sounds.  Pulmonary:     Effort: Pulmonary effort is normal.     Breath sounds: Normal breath sounds.  Abdominal:     Palpations: Abdomen is soft.     Tenderness: There is no abdominal tenderness.  Skin:  General: Skin is warm and dry.  Neurological:     Mental Status: She is alert.     Comments: Awake, alert, oriented to person, place, time, situation. No facial droop/slurred speech. 5/5 strength in BUE, 4/5 strength in BLE.     (all labs ordered are listed, but only abnormal results are displayed) Labs Reviewed  COMPREHENSIVE METABOLIC PANEL WITH GFR - Abnormal; Notable for the following components:      Result Value   Chloride 97 (*)    CO2 21 (*)    Glucose, Bld 101 (*)    Creatinine, Ser 1.03 (*)    Total Protein 6.4 (*)    GFR, Estimated 50 (*)    Anion gap 20 (*)    All other components  within normal limits  CBC - Abnormal; Notable for the following components:   WBC 10.7 (*)    All other components within normal limits  TROPONIN T, HIGH SENSITIVITY - Abnormal; Notable for the following components:   Troponin T High Sensitivity 39 (*)    All other components within normal limits  PROTIME-INR  MAGNESIUM   URINALYSIS, ROUTINE W REFLEX MICROSCOPIC  CBG MONITORING, ED  TROPONIN T, HIGH SENSITIVITY    EKG: EKG Interpretation Date/Time:  Tuesday May 12 2024 13:01:55 EST Ventricular Rate:  69 PR Interval:    QRS Duration:  139 QT Interval:  492 QTC Calculation: 500 R Axis:   -81  Text Interpretation: Atrial fibrillation IVCD, consider atypical RBBB LVH with secondary repolarization abnormality Probable inferior infarct, recent Anterior infarct, old Confirmed by Freddi Hamilton [phone]) on 05/12/2024 1:26:45 PM  Radiology: DG Chest Portable 1 View Result Date: 05/12/2024 EXAM: 1 VIEW(S) XRAY OF THE CHEST 05/12/2024 01:37:00 PM COMPARISON: 12/08/2023 CLINICAL HISTORY: syncope, pacemaker FINDINGS: LUNGS AND PLEURA: Mildly lower lung volumes. No focal pulmonary opacity. No pleural effusion. No pneumothorax. HEART AND MEDIASTINUM: Stable left chest single lead cardiac pacemaker. Calcified aortic atherosclerosis. BONES AND SOFT TISSUES: Dextroscoliosis and degenerative changes in spine. IMPRESSION: 1. No acute cardiopulmonary abnormality. 2. Stable left chest single lead cardiac pacemaker. Electronically signed by: Norman Gatlin MD 05/12/2024 01:53 PM EST RP Workstation: [id number]     Procedures   Medications Ordered in the ED  sodium chloride  0.9 % bolus 500 mL (0 mLs Intravenous Stopped 05/12/24 1417)                                    Medical Decision Making Amount and/or Complexity of Data Reviewed Labs: ordered.    Details: Mildly elevated troponin Radiology: ordered and independent interpretation performed.    Details: Intact pacemaker ECG/medicine tests:  ordered and independent interpretation performed.    Details: No ischemia   Patient with syncope at her nursing facility.  Has been having this recently and feeling overall weak.  Does have poor p.o. intake.  Had some hypotension at the facility but none here.  Some mild bump in her creatinine was given some fluids.  Troponin is mildly elevated and will need to repeat though I suspect this is probably not ACS.  No chest symptoms otherwise such as chest pain or shortness of breath and no hypoxia so I think something like PE is pretty unlikely.  Interrogation of her device did not show any obvious events.  Will get second troponin.  Care transferred to Dr. Dean.     Final diagnoses:  None    ED Discharge Orders  None          Freddi Hamilton, MD 05/12/24 1541  "

## 2024-05-12 NOTE — ED Provider Notes (Signed)
 Pt signed out by Dr. Freddi pending 2nd trop. This was 29 down from 39 at 1318.  Pt's nurse attempted to get her up and she felt very unstable and weak.  Per his report, the pacemaker did not show an arrhythmia during the syncope.  I did speak with Dr. CHARLENA Carwin ([facility]) who will admit.   Dean Clarity, MD 05/12/24 [phone]

## 2024-05-13 ENCOUNTER — Inpatient Hospital Stay (HOSPITAL_COMMUNITY)
Admit: 2024-05-13 | Discharge: 2024-05-13 | Disposition: A | Discharge status: Home / Self Care | Attending: Internal Medicine | Admitting: Internal Medicine

## 2024-05-13 DIAGNOSIS — R569 Unspecified convulsions: Secondary | ICD-10-CM

## 2024-05-13 DIAGNOSIS — B3781 Candidal esophagitis: Secondary | ICD-10-CM | POA: Diagnosis present

## 2024-05-13 DIAGNOSIS — Z880 Allergy status to penicillin: Secondary | ICD-10-CM | POA: Diagnosis not present

## 2024-05-13 DIAGNOSIS — E785 Hyperlipidemia, unspecified: Secondary | ICD-10-CM | POA: Diagnosis present

## 2024-05-13 DIAGNOSIS — Z823 Family history of stroke: Secondary | ICD-10-CM | POA: Diagnosis not present

## 2024-05-13 DIAGNOSIS — E876 Hypokalemia: Secondary | ICD-10-CM | POA: Diagnosis present

## 2024-05-13 DIAGNOSIS — E43 Unspecified severe protein-calorie malnutrition: Secondary | ICD-10-CM | POA: Diagnosis present

## 2024-05-13 DIAGNOSIS — R55 Syncope and collapse: Secondary | ICD-10-CM | POA: Diagnosis present

## 2024-05-13 DIAGNOSIS — E872 Acidosis, unspecified: Secondary | ICD-10-CM | POA: Diagnosis present

## 2024-05-13 DIAGNOSIS — E119 Type 2 diabetes mellitus without complications: Secondary | ICD-10-CM | POA: Diagnosis present

## 2024-05-13 DIAGNOSIS — Z79899 Other long term (current) drug therapy: Secondary | ICD-10-CM | POA: Diagnosis not present

## 2024-05-13 DIAGNOSIS — Z681 Body mass index (BMI) 19 or less, adult: Secondary | ICD-10-CM | POA: Diagnosis not present

## 2024-05-13 DIAGNOSIS — Z96641 Presence of right artificial hip joint: Secondary | ICD-10-CM | POA: Diagnosis present

## 2024-05-13 DIAGNOSIS — E861 Hypovolemia: Secondary | ICD-10-CM | POA: Diagnosis present

## 2024-05-13 DIAGNOSIS — Z888 Allergy status to other drugs, medicaments and biological substances status: Secondary | ICD-10-CM | POA: Diagnosis not present

## 2024-05-13 DIAGNOSIS — F0390 Unspecified dementia without behavioral disturbance: Secondary | ICD-10-CM | POA: Diagnosis present

## 2024-05-13 DIAGNOSIS — I959 Hypotension, unspecified: Secondary | ICD-10-CM | POA: Diagnosis present

## 2024-05-13 DIAGNOSIS — Z66 Do not resuscitate: Secondary | ICD-10-CM | POA: Diagnosis present

## 2024-05-13 DIAGNOSIS — I10 Essential (primary) hypertension: Secondary | ICD-10-CM | POA: Diagnosis present

## 2024-05-13 DIAGNOSIS — I951 Orthostatic hypotension: Secondary | ICD-10-CM

## 2024-05-13 DIAGNOSIS — Z886 Allergy status to analgesic agent status: Secondary | ICD-10-CM | POA: Diagnosis not present

## 2024-05-13 DIAGNOSIS — Z882 Allergy status to sulfonamides status: Secondary | ICD-10-CM | POA: Diagnosis not present

## 2024-05-13 DIAGNOSIS — I4821 Permanent atrial fibrillation: Secondary | ICD-10-CM | POA: Diagnosis present

## 2024-05-13 DIAGNOSIS — Z950 Presence of cardiac pacemaker: Secondary | ICD-10-CM | POA: Diagnosis not present

## 2024-05-13 DIAGNOSIS — Z79890 Hormone replacement therapy (postmenopausal): Secondary | ICD-10-CM | POA: Diagnosis not present

## 2024-05-13 DIAGNOSIS — E039 Hypothyroidism, unspecified: Secondary | ICD-10-CM | POA: Diagnosis present

## 2024-05-13 LAB — BASIC METABOLIC PANEL WITH GFR
Anion gap: 16 — ABNORMAL HIGH (ref 5–15)
BUN: 10 mg/dL (ref 8–23)
CO2: 20 mmol/L — ABNORMAL LOW (ref 22–32)
Calcium: 8.3 mg/dL — ABNORMAL LOW (ref 8.9–10.3)
Chloride: 101 mmol/L (ref 98–111)
Creatinine, Ser: 0.81 mg/dL (ref 0.44–1.00)
GFR, Estimated: >60 mL/min
Glucose, Bld: 68 mg/dL — ABNORMAL LOW (ref 70–99)
Potassium: 3.7 mmol/L (ref 3.5–5.1)
Sodium: 137 mmol/L (ref 135–145)

## 2024-05-13 MED ORDER — LACTATED RINGERS IV SOLN: INTRAVENOUS | Status: AC

## 2024-05-13 MED ORDER — ENSURE PLUS HIGH PROTEIN PO LIQD
237.0000 mL | Freq: Two times a day (BID) | ORAL | Status: DC
  Administered 2024-05-13 – 2024-05-15 (×5): 237 mL via ORAL

## 2024-05-13 NOTE — Progress Notes (Signed)
 Patient discussed with primary team. 89 yo female history of afib, dementia admitted with syncope. Hypotensive in the field initially, improved with fluids. Initially prerenal which is also improving with fluids. Severely orthostatic this morning SBP 150 to 60 with standing. Pacemaker check was normal. Overall presentation consistent with hypovolemia,. Continue aggressive IV fluids, can restart diltiazem  once fluid resuscitated, can stay off atenolol .  I don't see anything additional for cardiology to add Can call us  back if needed.   Dorn Ross MD

## 2024-05-13 NOTE — Procedures (Signed)
 Patient Name: Kerry Carlson  MRN: [id number]  Epilepsy Attending: Arlin MALVA Krebs  Referring Physician/Provider: [name], MD  Date: 05/13/2024 Duration: 30.10 mins  Patient history: 89yo F with syncope. EEG to evaluate for seizure  Level of alertness: Awake  AEDs during EEG study: None  Technical aspects: This EEG study was done with scalp electrodes positioned according to the 10-20 International system of electrode placement. Electrical activity was reviewed with band pass filter of 1-70Hz , sensitivity of 7 uV/mm, display speed of 46mm/sec with a 60Hz  notched filter applied as appropriate. EEG data were recorded continuously and digitally stored.  Video monitoring was available and reviewed as appropriate.  Description: The posterior dominant rhythm consists of 8-9 Hz activity of moderate voltage (25-35 uV) seen predominantly in posterior head regions, symmetric and reactive to eye opening and eye closing. Hyperventilation and photic stimulation were not performed.     IMPRESSION: This study is within normal limits. No seizures or epileptiform discharges were seen throughout the recording.  A normal interictal EEG does not exclude the diagnosis of epilepsy.   Vickey Boak O Milus Fritze

## 2024-05-13 NOTE — Plan of Care (Signed)

## 2024-05-13 NOTE — Progress Notes (Signed)
 EEG complete - results pending

## 2024-05-13 NOTE — Plan of Care (Signed)
  Problem: Education: Goal: Knowledge of General Education information will improve Description: Including pain rating scale, medication(s)/side effects and non-pharmacologic comfort measures Outcome: Progressing   Problem: Clinical Measurements: Goal: Ability to maintain clinical measurements within normal limits will improve Outcome: Progressing Goal: Respiratory complications will improve Outcome: Progressing Goal: Cardiovascular complication will be avoided Outcome: Progressing   Problem: Activity: Goal: Risk for activity intolerance will decrease Outcome: Progressing   Problem: Nutrition: Goal: Adequate nutrition will be maintained Outcome: Progressing   Problem: Safety: Goal: Ability to remain free from injury will improve Outcome: Progressing   Problem: Skin Integrity: Goal: Risk for impaired skin integrity will decrease Outcome: Progressing   

## 2024-05-13 NOTE — Progress Notes (Signed)
 "          PROGRESS NOTE  Kerry Carlson FMW:[id number] DOB: 26-Oct-1925 DOA: 05/12/2024 PCP: Vernadine Charlie ORN, MD  Brief History:  89 y.o. female with medical history significant for atrial fibrillation, dementia, diabetes mellitus, hypertension, hypothyroidism.  Patient was brought to the emergency department from [facility] with reports of loss of consciousness. Patient was sitting down in a wheelchair and slumped over. She did not hit her head. EMS reported blood pressure of 71/51.  Patient does not recall the episode.  She denies any nausea, vomiting, diarrhea, abdominal pain, dysuria.  Notably, the patient had recent hospital admission at [facility] from 05/07/2014 to 05/12/2024 during which time she was admitted for dysphagia and a dilated esophagus.  She underwent EGD which showed Candida esophagitis and suspected dysmotility component.  She had a distal esophageal stricture that was dilated.  She was started on fluconazole , evaluated by SLP and will discharge on mechanically altered, soft and bite sized diet, with thin liquids  She had a syncopal episode at that time which was felt to be vasovagal and orthostatic.  05/04/2024 echo showed EF 55 to 60%, mild to moderate MR/TR, trivial pericardial effusion. ED Course: Temperature 97.5.  Heart rate 60s.  Respirate rate 15-20.  Blood pressure systolic 116-130.  Normal O2 sats. Troponin 39 > 29. EKG shows atrial fibrillation. 1 L bolus given. Pacemaker interrogated in the ED, no events. Patient unable to ambulate in the ED, was able to stand but not able to walk-felt weak and like she was going to faint.    Assessment/Plan:  Syncope- - EMS reports blood pressure 71/51 at scene.  -echo 12/10/2023 -EF of 50 to 55%.   -Hospitalized at [facility] 1/10-1/15 also for syncopal episode -due to orthostasis/vasovagal - SBP dropped from 152/65 to 63/41 from lying to standing here -05/04/24 EGD revealed Candida esophagitis.  Started on fluconazole  on  discharge. - 1 L bolus given in ED - continue IVF - Hold atenolol , Cardizem    Esophageal candidiasis -per above.   -She was to complete 12 days of fluconazole . - Resume fluconazole    Diabetes mellitus -diet controlled.  A1c 5.7.   Paroxysmal atrial fibrillation -rate controlled.   -Anticoagulation discontinued due to fall risk. - Atenolol  and Cardizem  held temporarily due to initial hypotension and syncope   Major neurocognitive disorder - Resume Namenda    Hypertension- -Holding atenolol  and Cardizem  secondary to hypotension       Family Communication:   no Family at bedside  Consultants:  none  Code Status:   DNR  DVT Prophylaxis:   [place] Lovenox    Procedures: As Listed in Progress Note Above  Antibiotics: None     Subjective: Patient denies fevers, chills, headache, chest pain, dyspnea, nausea, vomiting, diarrhea, abdominal pain, dysuria, hematuria, hematochezia, and melena.   Objective: Vitals:   05/12/24 2100 05/12/24 2253 05/13/24 0418 05/13/24 0715  BP:  128/64 129/65 (!) 149/61  Pulse:  61 68 66  Resp:  18 17   Temp:  (!) 97.5 F (36.4 C) 97.7 F (36.5 C)   TempSrc:  Oral Oral   SpO2:  96% 96% 97%  Weight: 50.9 kg     Height: 5' 6 (1.676 m)       Intake/Output Summary (Last 24 hours) at 05/13/2024 1021 Last data filed at 05/12/2024 2101 Gross per 24 hour  Intake 1200 ml  Output --  Net 1200 ml   Weight change:  Exam:  General:  Pt is alert, follows commands  appropriately, not in acute distress HEENT: No icterus, No thrush, No neck mass, [place]/AT Cardiovascular: RRR, S1/S2, no rubs, no gallops Respiratory: CTA bilaterally, no wheezing, no crackles, no rhonchi Abdomen: Soft/+BS, non tender, non distended, no guarding Extremities: No edema, No lymphangitis, No petechiae, No rashes, no synovitis   Data Reviewed: I have personally reviewed following labs and imaging studies Basic Metabolic Panel: Recent Labs  Lab 05/12/24 1318  05/13/24 0710  NA 137 137  K 4.4 3.7  CL 97* 101  CO2 21* 20*  GLUCOSE 101* 68*  BUN 16 10  CREATININE 1.03* 0.81  CALCIUM 9.4 8.3*  MG 2.1  --    Liver Function Tests: Recent Labs  Lab 05/12/24 1318  AST 19  ALT 21  ALKPHOS 64  BILITOT 0.8  PROT 6.4*  ALBUMIN 3.7   No results for input(s): LIPASE, AMYLASE in the last 168 hours. No results for input(s): AMMONIA in the last 168 hours. Coagulation Profile: Recent Labs  Lab 05/12/24 1318  INR 0.9   CBC: Recent Labs  Lab 05/12/24 1318  WBC 10.7*  HGB 14.2  HCT 43.9  MCV 95.4  PLT 246   Cardiac Enzymes: No results for input(s): CKTOTAL, CKMB, CKMBINDEX, TROPONINI in the last 168 hours. BNP: Invalid input(s): POCBNP CBG: Recent Labs  Lab 05/12/24 1306  GLUCAP 98   HbA1C: No results for input(s): HGBA1C in the last 72 hours. Urine analysis:    Component Value Date/Time   COLORURINE YELLOW 05/12/2024 1658   APPEARANCEUR CLEAR 05/12/2024 1658   LABSPEC 1.010 05/12/2024 1658   PHURINE 5.0 05/12/2024 1658   GLUCOSEU NEGATIVE 05/12/2024 1658   HGBUR NEGATIVE 05/12/2024 1658   BILIRUBINUR NEGATIVE 05/12/2024 1658   KETONESUR 20 (A) 05/12/2024 1658   PROTEINUR NEGATIVE 05/12/2024 1658   NITRITE NEGATIVE 05/12/2024 1658   LEUKOCYTESUR SMALL (A) 05/12/2024 1658   Sepsis Labs: @LABRCNTIP (procalcitonin:4,lacticidven:4) )No results found for this or any previous visit (from the past 240 hours).   Scheduled Meds:  enoxaparin  (LOVENOX ) injection  30 mg Subcutaneous Q24H   feeding supplement  237 mL Oral BID BM   fluconazole   200 mg Oral Daily   levothyroxine   100 mcg Oral QAC breakfast   memantine   10 mg Oral BID   Continuous Infusions:  sodium chloride  75 mL/hr at 05/13/24 0930    Procedures/Studies: DG Chest Portable 1 View Result Date: 05/12/2024 EXAM: 1 VIEW(S) XRAY OF THE CHEST 05/12/2024 01:37:00 PM COMPARISON: 12/08/2023 CLINICAL HISTORY: syncope, pacemaker FINDINGS: LUNGS AND  PLEURA: Mildly lower lung volumes. No focal pulmonary opacity. No pleural effusion. No pneumothorax. HEART AND MEDIASTINUM: Stable left chest single lead cardiac pacemaker. Calcified aortic atherosclerosis. BONES AND SOFT TISSUES: Dextroscoliosis and degenerative changes in spine. IMPRESSION: 1. No acute cardiopulmonary abnormality. 2. Stable left chest single lead cardiac pacemaker. Electronically signed by: Norman Gatlin MD 05/12/2024 01:53 PM EST RP Workstation: [id number]    Alm Schneider, DO  [facility]  If 7PM-7AM, please contact night-coverage [email or web address] Password [email or web address] 05/13/2024, 10:21 AM   LOS: 0 days   "

## 2024-05-13 NOTE — TOC Initial Note (Signed)
 Transition of Care [facility]) - Initial/Assessment Note    Patient Details  Name: Kerry Carlson MRN: [id number] Date of Birth: January 01, 1926  [facility] CM/SW Contact:    Sharlyne Stabs, RN Phone Number: 05/13/2024, 3:14 PM  Clinical Narrative:       Patient admitted with syncope. From [facility]. CM called her son, [place]. Mother walks with a walker at baseline, he realizes she weak. He states she has been requesting a wheelchair, but they do not want her sitting in a wheelchair all day. She is active with [facility] home health for PT/OT. Plans to return to [facility] when stable.    Expected Discharge Plan: Assisted Living Barriers to Discharge: Continued Medical Work up   Patient Goals and CMS Choice Patient states their goals for this hospitalization and ongoing recovery are:: return to [facility] CMS Medicare.gov Compare Post Acute Care list provided to:: Patient Represenative (must comment) Choice offered to / list presented to : Adult Children [place] ownership interest in [facility].provided to:: Adult Children    Expected Discharge Plan and Services       Living arrangements for the past 2 months: Assisted Living Facility          Prior Living Arrangements/Services Living arrangements for the past 2 months: Assisted Living Facility Lives with:: Facility Resident Patient language and need for interpreter reviewed:: Yes Do you feel safe going back to the place where you live?: Yes      Need for Family Participation in Patient Care: Yes (Comment) Care giver support system in place?: Yes (comment) Current home services: DME Criminal Activity/Legal Involvement Pertinent to Current Situation/Hospitalization: No - Comment as needed  Activities of Daily Living   ADL Screening (condition at time of admission) Independently performs ADLs?: No Does the patient have a NEW difficulty with bathing/dressing/toileting/self-feeding that is expected  to last >3 days?: No (not new for the pt, due to age) Does the patient have a NEW difficulty with getting in/out of bed, walking, or climbing stairs that is expected to last >3 days?: No (not new for the pt, due to age) Does the patient have a NEW difficulty with communication that is expected to last >3 days?: No Is the patient deaf or have difficulty hearing?: Yes Does the patient have difficulty seeing, even when wearing glasses/contacts?: No (wears glasses at [facility]) Does the patient have difficulty concentrating, remembering, or making decisions?: No  Permission Sought/Granted          Permission granted to share info w Relationship: SON     Emotional Assessment       Alcohol / Substance Use: Not Applicable Psych Involvement: No (comment)  Admission diagnosis:  Syncope [R55] Syncope, unspecified syncope type [R55] Patient Active Problem List   Diagnosis Date Noted   Orthostatic hypotension 05/13/2024   Syncope 05/12/2024   Syncope and collapse 12/08/2023   Chronic atrial fibrillation with RVR (HCC) 12/08/2023   Hyponatremia 12/08/2023   Essential hypertension 12/08/2023   Dementia with behavioral disturbance (HCC) 12/08/2023   Dyslipidemia 12/08/2023   Hypothyroidism 12/08/2023   GERD (gastroesophageal reflux disease) 02/19/2023   Dementia (HCC) 02/19/2023   Paroxysmal atrial fibrillation (HCC) 02/19/2023   Altered mental status 02/18/2023   AKI (acute kidney injury) 04/22/2021   Closed right hip fracture, initial encounter (HCC) 04/21/2021   Diabetes mellitus without complication (HCC) 04/21/2021   Hyperlipidemia 04/21/2021   Hypertension 04/21/2021   Thyroid  disease 04/21/2021   DNR (do not resuscitate) 04/21/2021  Acute lower UTI 04/21/2021   Encounter for care of pacemaker 05/23/2020   Pacemaker - Medtronic Azure single chamber pacemaker 07/12/2016. 05/23/2020   Second degree Mobitz II AV block 05/23/2020   Permanent atrial fibrillation (HCC) 05/23/2020    PCP:  Tisovec, Richard W, MD Pharmacy:   [facility] [phone] - [place], [place] - [address] [place] [address] Phone: [phone] Fax: [phone]  [facility] [place], [place] - [address] [facility] [place]. [address] [place] [address] Phone: [phone] Fax: [phone]    Social Drivers of Health (SDOH) Social History: SDOH Screenings   Food Insecurity: No Food Insecurity (05/12/2024)  Housing: Low Risk (05/12/2024)  Transportation Needs: No Transportation Needs (05/12/2024)  Utilities: Not At Risk (05/12/2024)  Social Connections: Unknown (05/12/2024)  Tobacco Use: Low Risk (05/02/2024)   Received from [facility]   SDOH Interventions:    Readmission Risk Interventions    05/13/2024    3:10 PM 12/10/2023    3:48 PM  Readmission Risk Prevention Plan  Post Dischage Appt Not Complete Complete  Medication Screening Complete Complete  Transportation Screening Complete Complete

## 2024-05-13 NOTE — Hospital Course (Signed)
 89 y.o. female with medical history significant for atrial fibrillation, dementia, diabetes mellitus, hypertension, hypothyroidism.  Patient was brought to the emergency department from [facility] with reports of loss of consciousness. Patient was sitting down in a wheelchair and slumped over. She did not hit her head. EMS reported blood pressure of 71/51.  Patient does not recall the episode.  She denies any nausea, vomiting, diarrhea, abdominal pain, dysuria.  Notably, the patient had recent hospital admission at [facility] from 05/07/2014 to 05/12/2024 during which time she was admitted for dysphagia and a dilated esophagus.  She underwent EGD which showed Candida esophagitis and suspected dysmotility component.  She had a distal esophageal stricture that was dilated.  She was started on fluconazole , evaluated by SLP and will discharge on mechanically altered, soft and bite sized diet, with thin liquids  She had a syncopal episode at that time which was felt to be vasovagal and orthostatic.  05/04/2024 echo showed EF 55 to 60%, mild to moderate MR/TR, trivial pericardial effusion. ED Course: Temperature 97.5.  Heart rate 60s.  Respirate rate 15-20.  Blood pressure systolic 116-130.  Normal O2 sats. Troponin 39 > 29. EKG shows atrial fibrillation. 1 L bolus given. Pacemaker interrogated in the ED, no events. Patient unable to ambulate in the ED, was able to stand but not able to walk-felt weak and like she was going to faint.

## 2024-05-14 ENCOUNTER — Encounter (HOSPITAL_COMMUNITY): Payer: Self-pay | Admitting: Internal Medicine

## 2024-05-14 DIAGNOSIS — Z950 Presence of cardiac pacemaker: Secondary | ICD-10-CM

## 2024-05-14 DIAGNOSIS — R55 Syncope and collapse: Secondary | ICD-10-CM | POA: Diagnosis not present

## 2024-05-14 DIAGNOSIS — E43 Unspecified severe protein-calorie malnutrition: Secondary | ICD-10-CM | POA: Diagnosis not present

## 2024-05-14 LAB — CBC
HCT: 37.2 % (ref 36.0–46.0)
Hemoglobin: 12.1 g/dL (ref 12.0–15.0)
MCH: 30.9 pg (ref 26.0–34.0)
MCHC: 32.5 g/dL (ref 30.0–36.0)
MCV: 95.1 fL (ref 80.0–100.0)
Platelets: 182 K/uL (ref 150–400)
RBC: 3.91 MIL/uL (ref 3.87–5.11)
RDW: 15.3 % (ref 11.5–15.5)
WBC: 6.5 K/uL (ref 4.0–10.5)
nRBC: 0 % (ref 0.0–0.2)

## 2024-05-14 LAB — BASIC METABOLIC PANEL WITH GFR
Anion gap: 11 (ref 5–15)
BUN: 6 mg/dL — ABNORMAL LOW (ref 8–23)
CO2: 24 mmol/L (ref 22–32)
Calcium: 8.4 mg/dL — ABNORMAL LOW (ref 8.9–10.3)
Chloride: 105 mmol/L (ref 98–111)
Creatinine, Ser: 0.72 mg/dL (ref 0.44–1.00)
GFR, Estimated: >60 mL/min
Glucose, Bld: 113 mg/dL — ABNORMAL HIGH (ref 70–99)
Potassium: 3.2 mmol/L — ABNORMAL LOW (ref 3.5–5.1)
Sodium: 141 mmol/L (ref 135–145)

## 2024-05-14 LAB — MAGNESIUM: Magnesium: 1.8 mg/dL (ref 1.7–2.4)

## 2024-05-14 MED ORDER — LACTATED RINGERS IV BOLUS
1000.0000 mL | Freq: Once | INTRAVENOUS | Status: AC
  Administered 2024-05-14: 1000 mL via INTRAVENOUS

## 2024-05-14 MED ORDER — POTASSIUM CHLORIDE CRYS ER 20 MEQ PO TBCR
40.0000 meq | EXTENDED_RELEASE_TABLET | Freq: Once | ORAL | Status: AC
  Administered 2024-05-14: 40 meq via ORAL

## 2024-05-14 MED ORDER — LACTATED RINGERS IV SOLN: INTRAVENOUS | Status: DC

## 2024-05-14 MED ORDER — ADULT MULTIVITAMIN W/MINERALS CH
1.0000 | ORAL_TABLET | Freq: Every day | ORAL | Status: DC
  Administered 2024-05-15: 1 via ORAL
  Filled 2024-05-14: qty 1

## 2024-05-14 MED ORDER — POTASSIUM CHLORIDE CRYS ER 20 MEQ PO TBCR: 40.0000 meq | EXTENDED_RELEASE_TABLET | Freq: Once | ORAL | Status: DC

## 2024-05-14 NOTE — Progress Notes (Addendum)
 Initial Nutrition Assessment  DOCUMENTATION CODES:  Severe malnutrition in context of social or environmental circumstances, Underweight  INTERVENTION:  Liberalize diet to regular Ensure Plus High Protein po BID, each supplement provides 350 kcal and 20 grams of protein Magic cup TID with meals, each supplement provides 290 kcal and 9 grams of protein MVI with minerals daily  NUTRITION DIAGNOSIS:  Severe Malnutrition related to social / environmental circumstances as evidenced by severe muscle depletion, percent weight loss.  GOAL:  Patient will meet greater than or equal to 90% of their needs  MONITOR:  PO intake, Supplement acceptance  REASON FOR ASSESSMENT:  Malnutrition Screening Tool   ASSESSMENT:  89 yo female admitted with syncopal episode. PMH includes HTN, hypothyroidism, HLD, DM-2, pacemaker, A fib.  Patient very HOH; she was unable to provide much nutrition hx. She states that she has lost a lot of weight because she doesn't eat right. Intake of meals since admission has been poor. She states that she doesn't like some of the food she's been receiving.    Patient meets criteria for severe malnutrition, given severe depletion of muscle mass with 23% weight loss within 6 months.   Usual weight: 65.9 kg (12/31/23) Current weight: 50.9 kg (05/13/23) 23% weight loss within the past 4.5 months is severe  Average Meal Intake: 1/21-1/22: 35% intake x 2 recorded meals  Nutritionally Relevant Medications: Scheduled Meds:  enoxaparin  (LOVENOX ) injection  30 mg Subcutaneous Q24H   feeding supplement  237 mL Oral BID BM   fluconazole   200 mg Oral Daily   levothyroxine   100 mcg Oral QAC breakfast   memantine   10 mg Oral BID   Continuous Infusions: PRN Meds:.acetaminophen  **OR** acetaminophen , ondansetron  **OR** ondansetron  (ZOFRAN ) IV, polyethylene glycol, traZODone   Labs Reviewed: K 3.2 CBG 99 (05/12/24)  NUTRITION - FOCUSED PHYSICAL EXAM: Flowsheet Row Most Recent  Value  Orbital Region Mild depletion  Upper Arm Region Moderate depletion  Thoracic and Lumbar Region Mild depletion  Buccal Region Mild depletion  Temple Region Moderate depletion  Clavicle Bone Region Severe depletion  Clavicle and Acromion Bone Region Severe depletion  Scapular Bone Region Severe depletion  Dorsal Hand Severe depletion  Patellar Region Unable to assess  Anterior Thigh Region Unable to assess  Posterior Calf Region Severe depletion  Edema (RD Assessment) None  Hair Reviewed  Eyes Reviewed  Mouth Reviewed  Skin Reviewed  Nails Reviewed   Diet Order:   Diet Order             Diet Heart Room service appropriate? Yes; Fluid consistency: Thin  Diet effective now                 EDUCATION NEEDS:  No education needs have been identified at this time  Skin:  Skin Assessment: Reviewed RN Assessment  Last BM:  no BM documented  Height:  Ht Readings from Last 1 Encounters:  05/12/24 5' 6 (1.676 m)   Weight:  Wt Readings from Last 1 Encounters:  05/12/24 50.9 kg   Ideal Body Weight:  59.1 kg  BMI:  Body mass index is 18.11 kg/m.  Estimated Nutritional Needs:  Kcal:  1400-1600 Protein:  70-80 gm Fluid:  1.5 L   Suzen HUNT RD, LDN, CNSC Contact via secure chat. If unavailable, use group chat RD Inpatient.

## 2024-05-14 NOTE — Progress Notes (Signed)
 "          PROGRESS NOTE  Kerry Carlson FMW:[id number] DOB: 1926-04-13 DOA: 05/12/2024 PCP: No primary care provider on file.  Brief History:  89 y.o. female with medical history significant for atrial fibrillation, dementia, diabetes mellitus, hypertension, hypothyroidism.  Patient was brought to the emergency department from [facility] with reports of loss of consciousness. Patient was sitting down in a wheelchair and slumped over. She did not hit her head. EMS reported blood pressure of 71/51.  Patient does not recall the episode.  She denies any nausea, vomiting, diarrhea, abdominal pain, dysuria.  Notably, the patient had recent hospital admission at [facility] from 05/07/2014 to 05/12/2024 during which time she was admitted for dysphagia and a dilated esophagus.  She underwent EGD which showed Candida esophagitis and suspected dysmotility component.  She had a distal esophageal stricture that was dilated.  She was started on fluconazole , evaluated by SLP and will discharge on mechanically altered, soft and bite sized diet, with thin liquids  She had a syncopal episode at that time which was felt to be vasovagal and orthostatic.  05/04/2024 echo showed EF 55 to 60%, mild to moderate MR/TR, trivial pericardial effusion. ED Course: Temperature 97.5.  Heart rate 60s.  Respirate rate 15-20.  Blood pressure systolic 116-130.  Normal O2 sats. Troponin 39 > 29. EKG shows atrial fibrillation. 1 L bolus given. Pacemaker interrogated in the ED, no events. Patient unable to ambulate in the ED, was able to stand but not able to walk-felt weak and like she was going to faint.    Assessment/Plan:  Syncope- - EMS reports blood pressure 71/51 at scene.  -echo 12/10/2023 -EF of 50 to 55%.   -Hospitalized at [facility] 1/10-1/15 also for syncopal episode -due to orthostasis/vasovagal - EEG negative for seizure - PPM interrogated--no events - SBP dropped from 152/65 to 63/41 from lying to standing  here -05/04/24 EGD revealed Candida esophagitis.  Started on fluconazole  on discharge. - 1 L bolus given in ED and continued on maintenance IV fluids - 05/14/24--repeat orthostatics BP dropped from 136/84 to 107/73 - Hold atenolol , Cardizem  - repeat LR bolus  and restart IVF 1/22   Esophageal candidiasis -per above.   -She was to complete 12 days of fluconazole . - Resume fluconazole    Diabetes mellitus -diet controlled.  A1c 5.7. -liberalize diet at this point   Paroxysmal atrial fibrillation -rate controlled.   -Anticoagulation discontinued due to fall risk. - Atenolol  and Cardizem  held temporarily due to initial hypotension and syncope   Major neurocognitive disorder - Resume Namenda    Hypertension- -Holding atenolol  and Cardizem  secondary to hypotension  Severe Malnutrition -liberalize diet -continue Ensure    Hypokalemia -replete -check mag--1.8       Family Communication:   son updated 1/22   Consultants:  none   Code Status:   DNR   DVT Prophylaxis:   Comern­o Lovenox               Subjective: Patient denies fevers, chills, headache, chest pain, dyspnea, nausea, vomiting, diarrhea, abdominal pain   Objective: Vitals:   05/14/24 0938 05/14/24 0941 05/14/24 0943 05/14/24 1338  BP: 136/84 107/73 (!) 135/116 111/62  Pulse: 88 80 (!) 54 63  Resp: 16 16 18 16   Temp: (!) 97.5 F (36.4 C) (!) 97.5 F (36.4 C) (!) 97.5 F (36.4 C) 97.6 F (36.4 C)  TempSrc: Oral Oral Oral Oral  SpO2: (!) 78% 90% (!) 84% 91%  Weight:  Height:        Intake/Output Summary (Last 24 hours) at 05/14/2024 1640 Last data filed at 05/14/2024 0954 Gross per 24 hour  Intake 300 ml  Output 1800 ml  Net -1500 ml   Weight change:  Exam:  General:  Pt is alert, follows commands appropriately, not in acute distress HEENT: No icterus, No thrush, No neck mass, [place]/AT Cardiovascular: IRRR, S1/S2, no rubs, no gallops Respiratory: bibasilar rales. No wheeze Abdomen: Soft/+BS,  non tender, non distended, no guarding Extremities: No edema, No lymphangitis, No petechiae, No rashes, no synovitis   Data Reviewed: I have personally reviewed following labs and imaging studies Basic Metabolic Panel: Recent Labs  Lab 05/12/24 1318 05/13/24 0710 05/14/24 0403  NA 137 137 141  K 4.4 3.7 3.2*  CL 97* 101 105  CO2 21* 20* 24  GLUCOSE 101* 68* 113*  BUN 16 10 6*  CREATININE 1.03* 0.81 0.72  CALCIUM 9.4 8.3* 8.4*  MG 2.1  --  1.8   Liver Function Tests: Recent Labs  Lab 05/12/24 1318  AST 19  ALT 21  ALKPHOS 64  BILITOT 0.8  PROT 6.4*  ALBUMIN 3.7   No results for input(s): LIPASE, AMYLASE in the last 168 hours. No results for input(s): AMMONIA in the last 168 hours. Coagulation Profile: Recent Labs  Lab 05/12/24 1318  INR 0.9   CBC: Recent Labs  Lab 05/12/24 1318 05/14/24 0403  WBC 10.7* 6.5  HGB 14.2 12.1  HCT 43.9 37.2  MCV 95.4 95.1  PLT 246 182   Cardiac Enzymes: No results for input(s): CKTOTAL, CKMB, CKMBINDEX, TROPONINI in the last 168 hours. BNP: Invalid input(s): POCBNP CBG: Recent Labs  Lab 05/12/24 1306  GLUCAP 98   HbA1C: No results for input(s): HGBA1C in the last 72 hours. Urine analysis:    Component Value Date/Time   COLORURINE YELLOW 05/12/2024 1658   APPEARANCEUR CLEAR 05/12/2024 1658   LABSPEC 1.010 05/12/2024 1658   PHURINE 5.0 05/12/2024 1658   GLUCOSEU NEGATIVE 05/12/2024 1658   HGBUR NEGATIVE 05/12/2024 1658   BILIRUBINUR NEGATIVE 05/12/2024 1658   KETONESUR 20 (A) 05/12/2024 1658   PROTEINUR NEGATIVE 05/12/2024 1658   NITRITE NEGATIVE 05/12/2024 1658   LEUKOCYTESUR SMALL (A) 05/12/2024 1658   Sepsis Labs: @LABRCNTIP (procalcitonin:4,lacticidven:4) )No results found for this or any previous visit (from the past 240 hours).   Scheduled Meds:  enoxaparin  (LOVENOX ) injection  30 mg Subcutaneous Q24H   feeding supplement  237 mL Oral BID BM   fluconazole   200 mg Oral Daily    levothyroxine   100 mcg Oral QAC breakfast   memantine   10 mg Oral BID   multivitamin with minerals  1 tablet Oral Daily   Continuous Infusions:  lactated ringers      lactated ringers       Procedures/Studies: EEG adult Result Date: 05/13/2024 [name], MD     05/13/2024  5:22 PM Patient Name: DILARA NAVARRETE MRN: [id number] Epilepsy Attending: Arlin KIDD Shelton Referring Physician/Provider: [name], MD Date: 05/13/2024 Duration: 30.10 mins Patient history: 89yo F with syncope. EEG to evaluate for seizure Level of alertness: Awake AEDs during EEG study: None Technical aspects: This EEG study was done with scalp electrodes positioned according to the 10-20 International system of electrode placement. Electrical activity was reviewed with band pass filter of 1-70Hz , sensitivity of 7 uV/mm, display speed of 64mm/sec with a 60Hz  notched filter applied as appropriate. EEG data were recorded continuously and digitally stored.  Video monitoring was available  and reviewed as appropriate. Description: The posterior dominant rhythm consists of 8-9 Hz activity of moderate voltage (25-35 uV) seen predominantly in posterior head regions, symmetric and reactive to eye opening and eye closing. Hyperventilation and photic stimulation were not performed.   IMPRESSION: This study is within normal limits. No seizures or epileptiform discharges were seen throughout the recording. A normal interictal EEG does not exclude the diagnosis of epilepsy. Arlin MALVA Krebs   DG Chest Portable 1 View Result Date: 05/12/2024 EXAM: 1 VIEW(S) XRAY OF THE CHEST 05/12/2024 01:37:00 PM COMPARISON: 12/08/2023 CLINICAL HISTORY: syncope, pacemaker FINDINGS: LUNGS AND PLEURA: Mildly lower lung volumes. No focal pulmonary opacity. No pleural effusion. No pneumothorax. HEART AND MEDIASTINUM: Stable left chest single lead cardiac pacemaker. Calcified aortic atherosclerosis. BONES AND SOFT TISSUES: Dextroscoliosis and degenerative changes in  spine. IMPRESSION: 1. No acute cardiopulmonary abnormality. 2. Stable left chest single lead cardiac pacemaker. Electronically signed by: Norman Gatlin MD 05/12/2024 01:53 PM EST RP Workstation: [id number]    Alm Schneider, DO  [facility]  If 7PM-7AM, please contact night-coverage [email or web address] Password [email or web address] 05/14/2024, 4:40 PM   LOS: 1 day   "

## 2024-05-14 NOTE — Plan of Care (Signed)
  Problem: Education: Goal: Knowledge of General Education information will improve Description: Including pain rating scale, medication(s)/side effects and non-pharmacologic comfort measures Outcome: Progressing   Problem: Clinical Measurements: Goal: Ability to maintain clinical measurements within normal limits will improve Outcome: Progressing   Problem: Activity: Goal: Risk for activity intolerance will decrease Outcome: Progressing   Problem: Pain Managment: Goal: General experience of comfort will improve and/or be controlled Outcome: Progressing   Problem: Safety: Goal: Ability to remain free from injury will improve Outcome: Progressing   Problem: Skin Integrity: Goal: Risk for impaired skin integrity will decrease Outcome: Progressing

## 2024-05-14 NOTE — Plan of Care (Signed)
  Problem: Education: Goal: Knowledge of General Education information will improve Description: Including pain rating scale, medication(s)/side effects and non-pharmacologic comfort measures Outcome: Progressing   Problem: Clinical Measurements: Goal: Ability to maintain clinical measurements within normal limits will improve Outcome: Progressing Goal: Will remain free from infection Outcome: Progressing Goal: Diagnostic test results will improve Outcome: Progressing Goal: Respiratory complications will improve Outcome: Progressing Goal: Cardiovascular complication will be avoided Outcome: Progressing   Problem: Activity: Goal: Risk for activity intolerance will decrease Outcome: Progressing   Problem: Nutrition: Goal: Adequate nutrition will be maintained Outcome: Progressing   Problem: Pain Managment: Goal: General experience of comfort will improve and/or be controlled Outcome: Progressing   Problem: Safety: Goal: Ability to remain free from injury will improve Outcome: Progressing

## 2024-05-15 LAB — BASIC METABOLIC PANEL WITH GFR
Anion gap: 10 (ref 5–15)
BUN: 7 mg/dL — ABNORMAL LOW (ref 8–23)
CO2: 25 mmol/L (ref 22–32)
Calcium: 8.6 mg/dL — ABNORMAL LOW (ref 8.9–10.3)
Chloride: 105 mmol/L (ref 98–111)
Creatinine, Ser: 0.59 mg/dL (ref 0.44–1.00)
GFR, Estimated: >60 mL/min
Glucose, Bld: 112 mg/dL — ABNORMAL HIGH (ref 70–99)
Potassium: 4.1 mmol/L (ref 3.5–5.1)
Sodium: 140 mmol/L (ref 135–145)

## 2024-05-15 LAB — MAGNESIUM: Magnesium: 1.6 mg/dL — ABNORMAL LOW (ref 1.7–2.4)

## 2024-05-15 MED ORDER — ENSURE PLUS HIGH PROTEIN PO LIQD: 237.0000 mL | Freq: Two times a day (BID) | ORAL | Status: AC

## 2024-05-15 MED ORDER — MAGNESIUM SULFATE 2 GM/50ML IV SOLN
2.0000 g | Freq: Once | INTRAVENOUS | Status: AC
  Administered 2024-05-15: 2 g via INTRAVENOUS
  Filled 2024-05-15: qty 50

## 2024-05-15 MED ORDER — FLUCONAZOLE 200 MG PO TABS: 200.0000 mg | ORAL_TABLET | Freq: Every day | ORAL | Status: AC

## 2024-05-15 MED ORDER — ATENOLOL 25 MG PO TABS
12.5000 mg | ORAL_TABLET | Freq: Two times a day (BID) | ORAL | Status: DC
  Administered 2024-05-15: 12.5 mg via ORAL
  Filled 2024-05-15: qty 1

## 2024-05-15 MED ORDER — LACTATED RINGERS IV BOLUS: 1000.0000 mL | Freq: Once | INTRAVENOUS | Status: DC

## 2024-05-15 MED ORDER — ATENOLOL 25 MG PO TABS: 12.5000 mg | ORAL_TABLET | Freq: Two times a day (BID) | ORAL | 1 refills | Status: AC

## 2024-05-15 NOTE — Discharge Summary (Signed)
 " Physician Discharge Summary   Patient: Kerry Carlson MRN: [id number] DOB: Jul 31, 1925  Admit date:     05/12/2024  Discharge date: 05/15/24  Discharge Physician: Alm Zykera Abella   PCP: No primary care provider on file.   Recommendations at discharge:   Please follow up with primary care provider within 1-2 weeks  Please repeat BMP and CBC in one week   Hospital Course: 89 y.o. female with medical history significant for atrial fibrillation, dementia, diabetes mellitus, hypertension, hypothyroidism.  Patient was brought to the emergency department from [facility] with reports of loss of consciousness. Patient was sitting down in a wheelchair and slumped over. She did not hit her head. EMS reported blood pressure of 71/51.  Patient does not recall the episode.  She denies any nausea, vomiting, diarrhea, abdominal pain, dysuria.  Notably, the patient had recent hospital admission at [facility] from 05/07/2014 to 05/12/2024 during which time she was admitted for dysphagia and a dilated esophagus.  She underwent EGD which showed Candida esophagitis and suspected dysmotility component.  She had a distal esophageal stricture that was dilated.  She was started on fluconazole , evaluated by SLP and will discharge on mechanically altered, soft and bite sized diet, with thin liquids  She had a syncopal episode at that time which was felt to be vasovagal and orthostatic.  05/04/2024 echo showed EF 55 to 60%, mild to moderate MR/TR, trivial pericardial effusion. ED Course: Temperature 97.5.  Heart rate 60s.  Respirate rate 15-20.  Blood pressure systolic 116-130.  Normal O2 sats. Troponin 39 > 29. EKG shows atrial fibrillation. 1 L bolus given. Pacemaker interrogated in the ED, no events. Patient unable to ambulate in the ED, was able to stand but not able to walk-felt weak and like she was going to faint.   Assessment and Plan: Syncope- - EMS reports blood pressure 71/51 at scene.  -echo 12/10/2023 -EF of 50  to 55%.   -Hospitalized at [facility] 1/10-1/15 also for syncopal episode -due to orthostasis/vasovagal - EEG negative for seizure - PPM interrogated--no events - SBP dropped from 152/65 to 63/41 from lying to standing here -05/04/24 EGD revealed Candida esophagitis.  Started on fluconazole  on discharge. - 1 L bolus given in ED and continued on maintenance IV fluids - 05/14/24--repeat orthostatics BP dropped from 136/84 to 107/73 - Hold atenolol , Cardizem  initially - repeated LR bolus  and restart IVF 1/22   Esophageal candidiasis -per above.   -She was to complete 12 days of fluconazole . - Resume fluconazole    Diabetes mellitus -diet controlled.  A1c 5.7. -liberalize diet at this point   Paroxysmal atrial fibrillation -rate controlled.   -Anticoagulation discontinued due to fall risk. - Atenolol  and Cardizem  held temporarily due to initial hypotension and syncope - restart atenolol  lower dose, will not restart cardizem    Major neurocognitive disorder - Resume Namenda    Hypertension- -Holding atenolol  and Cardizem  secondary to hypotension - restart atenolol  lower dose at time of d/c - will not restart cardizeml    Severe Malnutrition -liberalize diet -continue Ensure    Hypokalemia/Hypomagnesemia -replete -check mag--1.8      Consultants: none Procedures performed: none  Disposition: [place] Diet recommendation:  Regular diet DISCHARGE MEDICATION: Allergies as of 05/15/2024       Reactions   Aspirin Nausea And Vomiting   Nitrofurantoin Other (See Comments)   Confusion    Sulfamethoxazole-trimethoprim Other (See Comments)   Increased confusion   Naproxen Hives, Dermatitis, Rash   Penicillins Hives, Rash, Other (See Comments)  Had a really long time ago, caused hives        Medication List     STOP taking these medications    diltiazem  120 MG 24 hr capsule Commonly known as: CARDIZEM  CD       TAKE these medications    acetaminophen  500 MG  tablet Commonly known as: TYLENOL  Take 1,000 mg by mouth 2 (two) times daily.   atenolol  25 MG tablet Commonly known as: TENORMIN  Take 0.5 tablets (12.5 mg total) by mouth 2 (two) times daily. What changed:  medication strength how much to take   feeding supplement Liqd Take 237 mLs by mouth 2 (two) times daily between meals.   fluconazole  200 MG tablet Commonly known as: DIFLUCAN  Take 1 tablet (200 mg total) by mouth daily for 12 days. For 8 days Start taking on: May 16, 2024 What changed: additional instructions   levothyroxine  100 MCG tablet Commonly known as: SYNTHROID  Take 100 mcg by mouth daily before breakfast.   multivitamin with minerals Tabs tablet Take 1 tablet by mouth daily.   Namenda  10 MG tablet Generic drug: memantine  Take 10 mg by mouth 2 (two) times daily.   pantoprazole 40 MG tablet Commonly known as: PROTONIX Take 40 mg by mouth daily.        Discharge Exam: Filed Weights   05/12/24 1257 05/12/24 2100  Weight: 54.4 kg 50.9 kg   HEENT:  Kerry Carlson, No thrush, no icterus CV:  IRRR, no rub, no S3, no S4 Lung:  CTA, no wheeze, no rhonchi Abd:  soft/+BS, NT Ext:  No edema, no lymphangitis, no synovitis, no rash   Condition at discharge: stable  The results of significant diagnostics from this hospitalization (including imaging, microbiology, ancillary and laboratory) are listed below for reference.   Imaging Studies: EEG adult Result Date: 05/13/2024 Kerry Arlin KIDD, MD     05/13/2024  5:22 PM Patient Name: Kerry Carlson MRN: [id number] Epilepsy Attending: Arlin Carlson Kerry Referring Physician/Provider: [name], MD Date: 05/13/2024 Duration: 30.10 mins Patient history: 89yo F with syncope. EEG to evaluate for seizure Level of alertness: Awake AEDs during EEG study: None Technical aspects: This EEG study was done with scalp electrodes positioned according to the 10-20 International system of electrode placement. Electrical activity was reviewed  with band pass filter of 1-70Hz , sensitivity of 7 uV/mm, display speed of 64mm/sec with a 60Hz  notched filter applied as appropriate. EEG data were recorded continuously and digitally stored.  Video monitoring was available and reviewed as appropriate. Description: The posterior dominant rhythm consists of 8-9 Hz activity of moderate voltage (25-35 uV) seen predominantly in posterior head regions, symmetric and reactive to eye opening and eye closing. Hyperventilation and photic stimulation were not performed.   IMPRESSION: This study is within normal limits. No seizures or epileptiform discharges were seen throughout the recording. A normal interictal EEG does not exclude the diagnosis of epilepsy. Arlin Carlson Kerry   DG Chest Portable 1 View Result Date: 05/12/2024 EXAM: 1 VIEW(S) XRAY OF THE CHEST 05/12/2024 01:37:00 PM COMPARISON: 12/08/2023 CLINICAL HISTORY: syncope, pacemaker FINDINGS: LUNGS AND PLEURA: Mildly lower lung volumes. No focal pulmonary opacity. No pleural effusion. No pneumothorax. HEART AND MEDIASTINUM: Stable left chest single lead cardiac pacemaker. Calcified aortic atherosclerosis. BONES AND SOFT TISSUES: Dextroscoliosis and degenerative changes in spine. IMPRESSION: 1. No acute cardiopulmonary abnormality. 2. Stable left chest single lead cardiac pacemaker. Electronically signed by: Norman Gatlin MD 05/12/2024 01:53 PM EST RP Workstation: [id number]    Microbiology: Results for  orders placed or performed during the hospital encounter of 12/08/23  Urine Culture     Status: Abnormal   Collection Time: 12/08/23 10:34 PM   Specimen: Urine, Random  Result Value Ref Range Status   Specimen Description   Final    URINE, RANDOM Performed at [facility], [address] [place], [place] [address]    Special Requests   Final    NONE Performed at [facility], [address], [place], [place] [address]    Culture MULTIPLE SPECIES PRESENT, SUGGEST RECOLLECTION (A)  Final   Report  Status 12/10/2023 FINAL  Final    Labs: CBC: Recent Labs  Lab 05/12/24 1318 05/14/24 0403  WBC 10.7* 6.5  HGB 14.2 12.1  HCT 43.9 37.2  MCV 95.4 95.1  PLT 246 182   Basic Metabolic Panel: Recent Labs  Lab 05/12/24 1318 05/13/24 0710 05/14/24 0403 05/15/24 0419  NA 137 137 141 140  K 4.4 3.7 3.2* 4.1  CL 97* 101 105 105  CO2 21* 20* 24 25  GLUCOSE 101* 68* 113* 112*  BUN 16 10 6* 7*  CREATININE 1.03* 0.81 0.72 0.59  CALCIUM 9.4 8.3* 8.4* 8.6*  MG 2.1  --  1.8 1.6*   Liver Function Tests: Recent Labs  Lab 05/12/24 1318  AST 19  ALT 21  ALKPHOS 64  BILITOT 0.8  PROT 6.4*  ALBUMIN 3.7   CBG: Recent Labs  Lab 05/12/24 1306  GLUCAP 98    Discharge time spent: greater than 30 minutes.  Signed: Alm Schneider, MD [facility] 05/15/2024 "

## 2024-05-15 NOTE — Plan of Care (Signed)
  Problem: Education: Goal: Knowledge of General Education information will improve Description: Including pain rating scale, medication(s)/side effects and non-pharmacologic comfort measures Outcome: Progressing   Problem: Clinical Measurements: Goal: Respiratory complications will improve Outcome: Progressing   Problem: Activity: Goal: Risk for activity intolerance will decrease Outcome: Progressing   Problem: Nutrition: Goal: Adequate nutrition will be maintained Outcome: Progressing   Problem: Safety: Goal: Ability to remain free from injury will improve Outcome: Progressing   Problem: Skin Integrity: Goal: Risk for impaired skin integrity will decrease Outcome: Progressing   

## 2024-05-15 NOTE — Progress Notes (Signed)
 Report called to [facility], IV removed, site WNL. EMS to transport.

## 2024-05-15 NOTE — Care Management Important Message (Signed)
 Important Message  Patient Details  Name: Kerry Carlson MRN: [id number] Date of Birth: 1925-11-05   Important Message Given:  N/A - LOS <3 / Initial given by admissions     Kerry Carlson 05/15/2024, 12:10 PM

## 2024-05-15 NOTE — H&P (Signed)
 "   History and Physical [facility]   05/15/24    Patient name: Kerry Carlson DOB 1925/07/19 MRN#: [id number] PCP: Freddrick, Information Unavailable Time: 11:27 PM Primary Care Provider:  Freddrick, Information Unavailable Inpatient primary attending provider: Darin Shelvy Lonni Verdie, MD  _________________________________________________________________________  Admission HPI   Patient admitted on: 05/15/2024  6:34 PM  Patient admitted by: Darin Shelvy Lonni Verdie, MD   CHIEF COMPLAINT:  Confusion  Day of admission HPI:  Kerry Carlson  is a 89 y.o. female with a PMH significant for thyroid  disorder, HLD, HTN, dementia, chronic A-fib, who presented  to the ED from the local nursing facility with concerns for syncopal episode.  The patient does have a history of dementia but it is noted in the report that she is notably less responsive than at baseline and seemed a lot more lethargic than usual.  History is somewhat limited by the patient's mental status.  She was reportedly significantly more alert upon reaching the ED and was able to answer orientation questions appropriately.  Workup in the ED significant for elevated troponin: 51 => 38 and a proBNP of 4611 and hypokalemia with a K: 3.2.  Chest x-ray shows no acute cardiopulmonary abnormality.   Patient admitted on Home O2? - no Patient on home anticoagulant? -  no Patient admitted with Chronic home foley catheter? - no Foley catheter placed or replaced by another service prior to admission? - no Central Line Status: NONE  Mental Status on Admission: The patient is not Alert and oriented to PERSON The patient is not Alert And oriented to TIME The patient is not Alert and oriented to LOCATION  Physical Exam  Constitutional: She appears acutely ill.  HENT: Tympanic membranes normal. Mouth/Throat: Oropharynx is clear.  Eyes: Pupils are equal, round, and reactive to light. Conjunctivae are  normal.  Pulmonary/Chest: Effort normal.  Neurological: She exhibits a cognitive deficit.  Skin: Skin is warm.     Problem List, Assessment & Plan    ASSESSMENT & PLAN (In order of descending acuity)  Acute encephalopathy Etiology unclear, no acute on, this could be progression of her baseline dementia, will broaden workup to include thyroid  studies, lactic acid is unremarkable will check thyroid  studies, ammonia, procalcitonin, constipation, polypharmacy  Elevated proBNP proBNP: 4611, will check an echo if available.  Elevated troponin Troponin: 51 => 38, suspect this may be secondary to demand ischemia, no report of chest pain, confusion, nausea, vomiting or diarrhea.  HTN Resume home antihypertensive regimen.  Dementia The patient is on pharmacotherapy at the facility, will resume as soon as able.   ADDITIONAL NON-ACUTE FINDINGS, OBSERVATIONS, FAMILY DISCUSSIONS, ETC. (When present):  No    Incidental Findings for ourpatient Follow-Up: No significant incidental findings present   DVT Prophylaxis Ordered: SQ Heparin  __________________________________________________________________________  Temp:  [36.7 C (98.1 F)] 36.7 C (98.1 F) Pulse:  [73-86] 73 SpO2 Pulse:  [73-85] 75 Resp:  [12-14] 12 BP: (108-142)/(58-85) 108/58 SpO2:  [96 %-100 %] 96 % There is no height or weight on file to calculate BMI. Intake/Output last 3 shifts: No intake/output data recorded.  Consults Requested  None     In hospital Nutrition: No diet orders on file    An advanced care planning discussion was not had with patient and/or patient's decisions maker (documented separately).  CODE STATUS :                    No Order  Given this patient's known comorbid illnesses and condition Present on Admission, plan of care includes acute interventions of evaluation of acute encephalopathy as noted in the Problem List, Assessment & Plan noted above.  I fully expect, with this information  in hand, that this patient will require at least two medically necessary midnights of hospital care prior to a safe discharge.   The patient's hospital stay is complicated by the above clinically significant conditions, as documented in Assessment and Plan, which are present on admission and requiring additional evaluation and treatment or having a significant effect of this patient's care.    __________________________________________________________  Allergies  Allergen Reactions   Aspirin Nausea And Vomiting, Other (See Comments) and Rash   Naproxen Dermatitis, Hives, Other (See Comments) and Rash   Nitrofurantoin Other (See Comments)    Confusion   Penicillins Hives, Other (See Comments) and Rash    Had a really long time ago, caused hives, Has patient had a PCN reaction causing immediate rash, facial/tongue/throat swelling, SOB or lightheadedness with hypotension: unknown, Has patient had a PCN reaction causing severe rash involving mucus membranes or skin necrosis: unknown, Has patient had a PCN reaction that required hospitalization unknown, Has patient had a PCN reaction occurring within the last 10 years:no, If all of the above answers are NO, then may proceed with Cephalosporin use.  Had a really long time ago, caused hives  Has patient had a PCN reaction causing immediate rash, facial/tongue/throat swelling, SOB or lightheadedness with hypotension: unknown  Has patient had a PCN reaction causing severe rash involving mucus membranes or skin necrosis: unknown  Has patient had a PCN reaction that required hospitalization unknown  Has patient had a PCN reaction occurring within the last 10 years:no  If all of the above answers are NO, then may proceed with Cephalosporin use.  Had a really long time ago, caused hives   Sulfamethoxazole-Trimethoprim Other (See Comments)    Increased confusion     Past Medical History[1]  Past Surgical History[2]   Family History[3]        Current Medications[4]  REFER TO EPIC FOR FULL LIST OF CURRENT MEDICATIONS ORDERED ON ADMISSION. THESE ORDERS APPEAR ONLY WHEN RELEASED, WHICH MAY HAPPEN AFTER ADMISSION ONCE PATIENT IS TRANSFERRED FROM THE ED.  Allergies  Allergies[5]  Imaging  XR Chest Portable Result Date: 05/15/2024 Exam:  Portable Chest  History: Cough.  Technique:  Single AP portable view  Comparison: 05/01/2024 chest x-ray and chest CT studies.  Findings: Unchanged cardiomediastinal contour. Unchanged marked gaseous dilatation of the proximal esophagus. No pneumothorax, detectable pleural effusion, focal lung consolidation, or pulmonary edema. Aortic arch mild calcification. Left chest single lead pacemaker. Bones are unremarkable.     No acute cardiopulmonary abnormality.      Signed (Electronic Signature): 05/15/2024 8:35 PM Signed By: Sendhil Cheran, MD  CT Head Wo Contrast Result Date: 05/15/2024 Exam:  CT Head without Contrast  History: Altered mental status. Weakness.  Technique: Routine brain CT without IV contrast. AEC (automated exposure control) and/or manual techniques such as size-specific kV and mAs are employed where appropriate to reduce radiation exposure for all CT exams.  Comparison: 05/01/2024  Findings:   BRAIN:  No CT evidence of acute infarction, hemorrhage, edema, mass or mass effect. Ventricles and basal cisterns are unremarkable. Mild-moderate cerebral atrophy.  Bilateral carotid siphon and intracranial distal vertebral artery mild atherosclerotic calcification. Mild hypoattenuation of the periventricular white matter.  SOFT TISSUES:  Negative. CALVARIUM:  Negative. No fracture. SINUSES AND MASTOIDS:  No mucosal thickening or fluid.     1. No acute intracranial abnormality.  2. Intracranial atherosclerosis. Chronic microvascular ischemic change.    Signed (Electronic Signature): 05/15/2024 7:15 PM Signed By: Genia Rosebush, MD   Lab Results   Recent Labs    05/15/24 1900  WBC 6.7  HGB 11.7   HCT 35.9  PLT 174   Recent Labs    05/15/24 1900  NA 144  K 4.2  CL 107  CO2 31.2  BUN 9  CREATININE 0.66  GLU 125  CALCIUM 8.5  ALBUMIN 2.4*  PROT 5.5*  BILITOT 0.5  AST 16  ALT 23  ALKPHOS 60  MG 2.3  TSH 2.570  LACTATE 1.6   Recent Labs    05/15/24 2045  TROPONINI 38*   No results for input(s): WBCUA, NITRITE, LEUKOCYTESUR, BACTERIA, RBCUA, BLOODU, GLUCOSEU, PROTEINUA, KETONESU, KETUR in the last 72 hours. No results for input(s): OPIAU, BENZU, TRICYCLIC, PCPU, AMPHU, COCAU, CANNAU, BARBU, ETOH, ACETAMIN, SALICYLATE in the last 72 hours. No results for input(s): PREGTESTUR, PREGPOC in the last 72 hours. No results for input(s): OCCULTBLD, RAPSCRN, CDIFRPCR, CDIFFNAP1, A1C, CHOL, LDL, HDL, TRIG in the last 72 hours. No results for input(s): O2SOUR, FIO2ART, PHART, PCO2ART, PO2ART, HCO3ART, O2SATART, BEART in the last 72 hours.   Home Medications   Prior to Admission medications  Medication Dose, Route, Frequency  acetaminophen  (TYLENOL ) 500 MG tablet Take by mouth.  atenolol  (TENORMIN ) 50 MG tablet 50 mg, Daily (standard)  dilTIAZem  (CARDIZEM  CD) 120 MG 24 hr capsule 120 mg, Daily (standard)  fluconazole  (DIFLUCAN ) 200 MG tablet 200 mg, Daily (standard)  levothyroxine  (SYNTHROID ) 100 MCG tablet 100 mcg, Daily (standard)  memantine  (NAMENDA ) 10 MG tablet 10 mg, 2 times a day (standard)  multivitamin (TAB-A-VITE/THERAGRAN) per tablet 1 tablet, Daily (standard)  pantoprazole (PROTONIX) 40 MG tablet 40 mg, Daily before breakfast   Darin GORMAN Schooling, MD Hospitalist, [facility] 05/15/24, 11:27 PM   I was outside the hospital.  I spent 8 minutes on the real-time audio and video with the patient for a a consultation. Total time was 45 minutes and over 50% of the service was counseling and/or coordination of care within the patient unit. Physical Exam performed with the assistance of bedside nursing  staff.       [1] Past Medical History: Diagnosis Date   A-fib (CMS-HCC)    Dementia (CMS-HCC)    Disease of thyroid  gland    Heat syncope, subsequent encounter    Hyperlipidemia    Hypertension   [2] No past surgical history on file. [3] History reviewed. No pertinent family history. [4] No current facility-administered medications for this encounter.  Current Outpatient Medications:    acetaminophen  (TYLENOL ) 500 MG tablet, Take by mouth., Disp: , Rfl:    atenolol  (TENORMIN ) 50 MG tablet, Take 1 tablet (50 mg total) by mouth daily., Disp: , Rfl:    dilTIAZem  (CARDIZEM  CD) 120 MG 24 hr capsule, Take 1 capsule (120 mg total) by mouth daily., Disp: , Rfl:    fluconazole  (DIFLUCAN ) 200 MG tablet, Take 1 tablet (200 mg total) by mouth daily., Disp: , Rfl:    levothyroxine  (SYNTHROID ) 100 MCG tablet, Take 1 tablet (100 mcg total) by mouth daily., Disp: , Rfl:    memantine  (NAMENDA ) 10 MG tablet, Take 1 tablet (10 mg total) by mouth two (2) times a day., Disp: , Rfl:    multivitamin (TAB-A-VITE/THERAGRAN) per tablet, Take 1 tablet by mouth daily., Disp: , Rfl:  pantoprazole (PROTONIX) 40 MG tablet, Take 1 tablet (40 mg total) by mouth daily before breakfast., Disp: , Rfl:  [5] Allergies Allergen Reactions   Aspirin Nausea And Vomiting, Other (See Comments) and Rash   Naproxen Dermatitis, Hives, Other (See Comments) and Rash   Nitrofurantoin Other (See Comments)    Confusion   Penicillins Hives, Other (See Comments) and Rash    Had a really long time ago, caused hives, Has patient had a PCN reaction causing immediate rash, facial/tongue/throat swelling, SOB or lightheadedness with hypotension: unknown, Has patient had a PCN reaction causing severe rash involving mucus membranes or skin necrosis: unknown, Has patient had a PCN reaction that required hospitalization unknown, Has patient had a PCN reaction occurring within the last 10 years:no, If all of the above  answers are NO, then may proceed with Cephalosporin use.  Had a really long time ago, caused hives  Has patient had a PCN reaction causing immediate rash, facial/tongue/throat swelling, SOB or lightheadedness with hypotension: unknown  Has patient had a PCN reaction causing severe rash involving mucus membranes or skin necrosis: unknown  Has patient had a PCN reaction that required hospitalization unknown  Has patient had a PCN reaction occurring within the last 10 years:no  If all of the above answers are NO, then may proceed with Cephalosporin use.  Had a really long time ago, caused hives   Sulfamethoxazole-Trimethoprim Other (See Comments)    Increased confusion  "

## 2024-05-15 NOTE — TOC Transition Note (Addendum)
 Transition of Care [facility]) - Discharge Note   Patient Details  Name: Kerry Carlson MRN: [id number] Date of Birth: May 19, 1925  [facility] CM/SW Contact:  Ronnald MARLA Sil, RN Phone Number: 05/15/2024, 11:50 AM   Clinical Narrative:    Patient discussed during Progression rounds this morning with Dr Tat emphasizing patient's medical readiness to transition out of hospital and to next level of Care.  CM conducted chart review to confirm discharge plan to return to [facility] and completed FL2 with updated Discharge Med list.  CM contacted ALF admit Dir - Lauraine and she confirmed patient's eligibility to return to ALF, provided room #37 and requested Nurse call report to [phone] and request to speak to Med Tech.  Lauraine also requested CM arrange for Wheelchair for patient, stating patient was utilizing a borrowed wheelchair before she was admitted and will need her own going forward.  However, chart review of CM - Rebecca's note from yesterday indicated Family opposed to patient having a WC, CM initially contacted Son GLENWOOD Barefoot to clarify, and he deferred decision to his Wife - [name] who has cared for the patient the past 6-years.    CM contacted Mitzie and engaged in a lengthy conversation as she considered the long-term outcomes of providing patient with a WC and potential for further functional decline and deterring patient from progressing to ambulation.  However, Mitzie did endorse concern for patient's safety and the potential for Falls, eventually acquiesced on her conviction of No Wheelchair and agreed to [facility] ordering WC with chair pad.  Per [facility] Admit Dir - Sarah, the facility works closely with [employer] and requested DME order be sent to them, CM complied, forwarded referral & DME order to [employer] for Wheelchair, and notifed agency liaison - Zach of DME order and patient's D/C via text.    Patient suffers from severe deconditioning which impairs  their ability to perform daily activities like ambulating in the home. A walker will not resolve issue with performing activities of daily living. A wheelchair will allow patient to safely perform daily activities. Patient can safely propel the wheelchair in the home or has a caregiver who can provide assistance  CM also noted patient was established with [facility] & OT, CM confirmed HH order entered, forwarded referral with HH order to agency via HUB and notified liaison - Sarah of patient's DC.    Due to patient's severe deconditioning, CM completed Med Necessity form and arranged for ambulance transport back to ALF thru [facility].  Primary Nurse - Lorenda and Dr Tat updated via SecureChat.  No additional transition needs identified, however CM team will continue to follow along and assist as appropriate.    Final next level of care: Assisted Living Barriers to Discharge: No Barriers Identified   Patient Goals and CMS Choice Patient states their goals for this hospitalization and ongoing recovery are:: Return to [facility] ALF CMS Medicare.gov Compare Post Acute Care list provided to:: Patient Represenative (must comment) Choice offered to / list presented to : Adult Children [address] ownership interest in [facility].provided to:: Adult Children    Discharge Placement Patient to be transferred to facility by: [facility] Name of family member notified: Son - Barefoot & DIL - Mitzie Patient and family notified of of transfer: 05/15/24  Discharge Plan and Services Additional resources added to the After Visit Summary for   Post Acute Care Choice: Durable Medical Equipment, Home Health  DME Arranged: Wheelchair manual DME Agency: [employer] Date DME Agency Contacted: 05/15/24 Time DME Agency Contacted: 1148 Representative spoke with at DME Agency: Darlyn via Text HH Arranged: PT, OT HH Agency: Other - See comment Damita) Date HH Agency Contacted:  05/15/24 Time HH Agency Contacted: 1150 Representative spoke with at [facility] Agency: Sarah via text  Social Drivers of Health (SDOH) Interventions SDOH Screenings   Food Insecurity: No Food Insecurity (05/12/2024)  Housing: Low Risk (05/12/2024)  Transportation Needs: No Transportation Needs (05/12/2024)  Utilities: Not At Risk (05/12/2024)  Social Connections: Unknown (05/12/2024)  Tobacco Use: Low Risk (05/14/2024)   Readmission Risk Interventions    05/15/2024   11:42 AM 05/13/2024    3:10 PM 12/10/2023    3:48 PM  Readmission Risk Prevention Plan  Post Dischage Appt  Not Complete Complete  Medication Screening Complete Complete Complete  Transportation Screening Complete Complete Complete

## 2024-05-15 NOTE — NC FL2 (Signed)
 " [place]  MEDICAID FL2 LEVEL OF CARE FORM     IDENTIFICATION  Patient Name: Kerry Carlson Birthdate: [birth year] Sex: female Admission Date (Current Location): 05/12/2024  [facility] and [place] Number:  [employer] and Address:  [facility],  [address], [place] [address]      Provider Number: [id number]  Attending Physician Name and Address:  [name], MD  Relative Name and Phone Number:  Janifer Medford Blades,  [phone]    Current Level of Care: Hospital Recommended Level of Care: Assisted Living Facility Prior Approval Number:    Date Approved/Denied:   PASRR Number:    Discharge Plan: Domiciliary (Rest home) (ALF)    Current Diagnoses: Patient Active Problem List   Diagnosis Date Noted   Protein-calorie malnutrition, severe 05/14/2024   Orthostatic hypotension 05/13/2024   Syncope 05/12/2024   Syncope and collapse 12/08/2023   Chronic atrial fibrillation with RVR (HCC) 12/08/2023   Hyponatremia 12/08/2023   Essential hypertension 12/08/2023   Dementia with behavioral disturbance (HCC) 12/08/2023   Dyslipidemia 12/08/2023   Hypothyroidism 12/08/2023   GERD (gastroesophageal reflux disease) 02/19/2023   Dementia (HCC) 02/19/2023   Paroxysmal atrial fibrillation (HCC) 02/19/2023   Altered mental status 02/18/2023   AKI (acute kidney injury) 04/22/2021   Closed right hip fracture, initial encounter (HCC) 04/21/2021   Diabetes mellitus without complication (HCC) 04/21/2021   Hyperlipidemia 04/21/2021   Hypertension 04/21/2021   Thyroid  disease 04/21/2021   DNR (do not resuscitate) 04/21/2021   Acute lower UTI 04/21/2021   Encounter for care of pacemaker 05/23/2020   Pacemaker - Medtronic Azure single chamber pacemaker 07/12/2016. 05/23/2020   Second degree Mobitz II AV block 05/23/2020   Permanent atrial fibrillation (HCC) 05/23/2020    Orientation RESPIRATION BLADDER Height & Weight     Self, Time, Situation, Place  Normal  Incontinent Weight: 50.9 kg Height:  5' 6 (167.6 cm)  BEHAVIORAL SYMPTOMS/MOOD NEUROLOGICAL BOWEL NUTRITION STATUS      Continent Diet (See DC summary)  AMBULATORY STATUS COMMUNICATION OF NEEDS Skin   Limited Assist Verbally Bruising                       Personal Care Assistance Level of Assistance  Bathing, Feeding, Dressing Bathing Assistance: Limited assistance Feeding assistance: Independent Dressing Assistance: Limited assistance     Functional Limitations Info  Sight, Hearing, Speech Sight Info: Impaired Hearing Info: Impaired Speech Info: Adequate    SPECIAL CARE FACTORS FREQUENCY  PT (By licensed PT), OT (By licensed OT)     PT Frequency: 3 times a week OT Frequency: 3 times a week            Contractures Contractures Info: Not present    Additional Factors Info  Code Status, Allergies Code Status Info: DNR- limited Allergies Info: Apirin, nitrofurantoin, sulfamethozazole-timethoprim, naproxen, penicillins           Current Medications (05/15/2024):  This is the current hospital active medication list Current Facility-Administered Medications  Medication Dose Route Frequency Provider Last Rate Last Admin   acetaminophen  (TYLENOL ) tablet 650 mg  650 mg Oral Q6H PRN Emokpae, Ejiroghene E, MD       Or   acetaminophen  (TYLENOL ) suppository 650 mg  650 mg Rectal Q6H PRN Emokpae, Ejiroghene E, MD       atenolol  (TENORMIN ) tablet 12.5 mg  12.5 mg Oral BID Tat, David, MD       enoxaparin  (LOVENOX ) injection 30 mg  30 mg  Subcutaneous Q24H Emokpae, Ejiroghene E, MD   30 mg at 05/14/24 2116   feeding supplement (ENSURE PLUS HIGH PROTEIN) liquid 237 mL  237 mL Oral BID BM Tat, Alm, MD   237 mL at 05/15/24 0845   fluconazole  (DIFLUCAN ) tablet 200 mg  200 mg Oral Daily Emokpae, Ejiroghene E, MD   200 mg at 05/15/24 9153   lactated ringers  infusion   Intravenous Continuous Tat, Alm, MD 75 mL/hr at 05/14/24 1753 New Bag at 05/14/24 1753   levothyroxine   (SYNTHROID ) tablet 100 mcg  100 mcg Oral QAC breakfast Emokpae, Ejiroghene E, MD   100 mcg at 05/15/24 9466   magnesium  sulfate IVPB 2 g 50 mL  2 g Intravenous Once Tat, Alm, MD       memantine  (NAMENDA ) tablet 10 mg  10 mg Oral BID Emokpae, Ejiroghene E, MD   10 mg at 05/15/24 0846   multivitamin with minerals tablet 1 tablet  1 tablet Oral Daily Tat, David, MD   1 tablet at 05/15/24 0845   ondansetron  (ZOFRAN ) tablet 4 mg  4 mg Oral Q6H PRN Emokpae, Ejiroghene E, MD       Or   ondansetron  (ZOFRAN ) injection 4 mg  4 mg Intravenous Q6H PRN Emokpae, Ejiroghene E, MD       polyethylene glycol (MIRALAX  / GLYCOLAX ) packet 17 g  17 g Oral Daily PRN Emokpae, Ejiroghene E, MD   17 g at 05/14/24 2129   traZODone  (DESYREL ) tablet 25 mg  25 mg Oral QHS PRN Emokpae, Ejiroghene E, MD         Discharge Medications: DISCHARGE MEDICATION: Allergies as of 05/15/2024         Reactions    Aspirin Nausea And Vomiting    Nitrofurantoin Other (See Comments)    Confusion     Sulfamethoxazole-trimethoprim Other (See Comments)    Increased confusion    Naproxen Hives, Dermatitis, Rash    Penicillins Hives, Rash, Other (See Comments)    Had a really long time ago, caused hives       Medication List       STOP taking these medications     diltiazem  120 MG 24 hr capsule Commonly known as: CARDIZEM  CD       TAKE these medications     acetaminophen  500 MG tablet Commonly known as: TYLENOL  Take 1,000 mg by mouth 2 (two) times daily.    atenolol  25 MG tablet Commonly known as: TENORMIN  Take 0.5 tablets (12.5 mg total) by mouth 2 (two) times daily. What changed:  medication strength how much to take    feeding supplement Liqd Take 237 mLs by mouth 2 (two) times daily between meals.    fluconazole  200 MG tablet Commonly known as: DIFLUCAN  Take 1 tablet (200 mg total) by mouth daily for 12 days. For 8 days Start taking on: May 16, 2024 What changed: additional instructions     levothyroxine  100 MCG tablet Commonly known as: SYNTHROID  Take 100 mcg by mouth daily before breakfast.    multivitamin with minerals Tabs tablet Take 1 tablet by mouth daily.    Namenda  10 MG tablet Generic drug: memantine  Take 10 mg by mouth 2 (two) times daily.    pantoprazole 40 MG tablet Commonly known as: PROTONIX Take 40 mg by mouth daily.    Relevant Imaging Results:  Relevant Lab Results:   Additional Information SS# [id number]  Ronnald MARLA Sil, RN     "

## 2024-05-17 NOTE — Progress Notes (Addendum)
 " PROGRESS NOTE [facility] 05/17/24    Patient name: Kerry Carlson DOB 1925/10/02 MRN#: [id number] PCP: Vernadine Charlie ORN, MD Time: 6:35 AM Primary Care Provider:  Vernadine Charlie ORN, MD Inpatient primary attending provider: Joana Marice Hurst, [facility] Course: No notes on file  _____________________________________  Admission HPI    Patient admitted on: 05/15/2024  6:34 PM  Patient admitted by: Darin Shelvy Lonni Verdie, MD    CHIEF COMPLAINT:  Confusion   Day of admission HPI:  Kerry Carlson  is a 89 y.o. female with a PMH significant for thyroid  disorder, HLD, HTN, dementia, chronic A-fib, who presented  to the ED from the local nursing facility with concerns for syncopal episode.  The patient does have a history of dementia but it is noted in the report that she is notably less responsive than at baseline and seemed a lot more lethargic than usual.  History is somewhat limited by the patient's mental status.  She was reportedly significantly more alert upon reaching the ED and was able to answer orientation questions appropriately.   Workup in the ED significant for elevated troponin: 51 => 38 and a proBNP of 4611 and hypokalemia with a K: 3.2.  Chest x-ray shows no acute cardiopulmonary abnormality.     Patient admitted on Home O2? - no Patient on home anticoagulant? -  no Patient admitted with Chronic home foley catheter? - no Foley catheter placed or replaced by another service prior to admission? - no Central Line Status: NONE   Mental Status on Admission: The patient is not Alert and oriented to PERSON The patient is not Alert And oriented to TIME The patient is not Alert and oriented to LOCATION   Physical Exam  Constitutional: She appears acutely ill.  HENT: Tympanic membranes normal. Mouth/Throat: Oropharynx is clear.  Eyes: Pupils are equal, round, and reactive to light. Conjunctivae are normal.  Pulmonary/Chest: Effort normal.   Neurological: She exhibits a cognitive deficit.  Skin: Skin is warm.        Problem List, Assessment & Plan     ASSESSMENT & PLAN (In order of descending acuity)   Acute encephalopathy Etiology unclear, no acute on, this could be progression of her baseline dementia, will broaden workup to include thyroid  studies, lactic acid is unremarkable will check thyroid  studies, ammonia, procalcitonin, constipation, polypharmacy   Elevated proBNP proBNP: 4611, will check an echo if available.   Elevated troponin Troponin: 51 => 38 => 48, suspect this may be secondary to demand ischemia, no report of chest pain, confusion, nausea, vomiting or diarrhea.   HTN Resume home antihypertensive regimen.   Dementia The patient is on pharmacotherapy at the facility, will resume as soon as able.   ADDITIONAL NON-ACUTE FINDINGS, OBSERVATIONS, FAMILY DISCUSSIONS, ETC. (When present):   No          Incidental Findings for ourpatient Follow-Up: No significant incidental findings present    DVT Prophylaxis Ordered: SQ Heparin  _____  May 17, 2024 => Patient sleeping comfortably in bed. Breakfast is yet to be delivered No overnight events.  Continue current treatment of levofloxacin for acute cystitis Patient's mentation has returned to baseline the day after admission and continues to be at baseline. For her A-fib, she is continuing her home medications of atenolol  and Cardizem . For DVT prophylaxis, patient is on enoxaparin  30 mg every 24 hours subcu  _____________________________________  Temp:  [35.9 C (96.6 F)-36.6 C (97.9 F)] 36.4 C (97.5 F) Pulse:  [  69-98] 78 Resp:  [17-18] 17 BP: (110-136)/(54-85) 124/54 SpO2:  [95 %-98 %] 98 % Body mass index is 18.93 kg/m. Intake/Output last 3 shifts: I/O last 3 completed shifts: In: 370 [P.O.:360; IV Piggyback:10] Out: 1325 [Urine:1325]  Consults Requested  None     In hospital Nutrition: Nutrition Therapy Heart Healthy    An  advanced care planning discussion was had with patient and/or patient's decisions maker (documented separately).  CODE STATUS :                    DNR and DNI   Discharge estimated within 1 days. Anticipated disposition:  To Assisted Living Facility => back to [facility] ______________________________________________________________  Current Medications[1] ________________________________________________________________  Allergies  Allergen Reactions   Aspirin Nausea And Vomiting, Other (See Comments) and Rash   Naproxen Dermatitis, Hives, Other (See Comments) and Rash   Nitrofurantoin Other (See Comments)    Confusion   Penicillins Hives, Other (See Comments) and Rash    Had a really long time ago, caused hives, Has patient had a PCN reaction causing immediate rash, facial/tongue/throat swelling, SOB or lightheadedness with hypotension: unknown, Has patient had a PCN reaction causing severe rash involving mucus membranes or skin necrosis: unknown, Has patient had a PCN reaction that required hospitalization unknown, Has patient had a PCN reaction occurring within the last 10 years:no, If all of the above answers are NO, then may proceed with Cephalosporin use.  Had a really long time ago, caused hives  Has patient had a PCN reaction causing immediate rash, facial/tongue/throat swelling, SOB or lightheadedness with hypotension: unknown  Has patient had a PCN reaction causing severe rash involving mucus membranes or skin necrosis: unknown  Has patient had a PCN reaction that required hospitalization unknown  Has patient had a PCN reaction occurring within the last 10 years:no  If all of the above answers are NO, then may proceed with Cephalosporin use.  Had a really long time ago, caused hives   Sulfamethoxazole-Trimethoprim Other (See Comments)    Increased confusion     Past Medical History[2]  Past Surgical History[3]   Family History[4]        Imaging  XR Abdomen  1 View Result Date: 05/16/2024 Exam:  Abdomen 1 view  History: Constipation  Technique:  Abdomen, 1 view (supine)  Comparison: CXR 05/15/2024  Findings: Bowel gas pattern is nonobstructed. Air is seen within nondilated small and large bowel loops.  Moderate to large amount of stool throughout the colon. There is some retained oral contrast in the distal colon, presumably from a prior study performed elsewhere.  Degenerative change in the spine. Right hip hemiarthroplasty.    Nonobstructive bowel gas pattern. Moderate to large amount of stool throughout the colon.      Signed (Electronic Signature): 05/16/2024 7:32 AM Signed By: Odella Cahill, MD  XR Chest Portable Result Date: 05/15/2024 Exam:  Portable Chest  History: Cough.  Technique:  Single AP portable view  Comparison: 05/01/2024 chest x-ray and chest CT studies.  Findings: Unchanged cardiomediastinal contour. Unchanged marked gaseous dilatation of the proximal esophagus. No pneumothorax, detectable pleural effusion, focal lung consolidation, or pulmonary edema. Aortic arch mild calcification. Left chest single lead pacemaker. Bones are unremarkable.     No acute cardiopulmonary abnormality.      Signed (Electronic Signature): 05/15/2024 8:35 PM Signed By: Sendhil Cheran, MD  CT Head Wo Contrast Result Date: 05/15/2024 Exam:  CT Head without Contrast  History: Altered mental status. Weakness.  Technique: Routine  brain CT without IV contrast. AEC (automated exposure control) and/or manual techniques such as size-specific kV and mAs are employed where appropriate to reduce radiation exposure for all CT exams.  Comparison: 05/01/2024  Findings:   BRAIN:  No CT evidence of acute infarction, hemorrhage, edema, mass or mass effect. Ventricles and basal cisterns are unremarkable. Mild-moderate cerebral atrophy.  Bilateral carotid siphon and intracranial distal vertebral artery mild atherosclerotic calcification. Mild hypoattenuation of the periventricular white  matter.  SOFT TISSUES:  Negative. CALVARIUM:  Negative. No fracture. SINUSES AND MASTOIDS:  No mucosal thickening or fluid.     1. No acute intracranial abnormality.  2. Intracranial atherosclerosis. Chronic microvascular ischemic change.    Signed (Electronic Signature): 05/15/2024 7:15 PM Signed By: Genia Rosebush, MD   Lab Results   Recent Labs    05/16/24 0751  WBC 6.6  HGB 11.9  HCT 34.9  PLT 153   Recent Labs    05/15/24 1900 05/15/24 2045 05/16/24 0751  NA 144  --  142  K 4.2  --  3.7  CL 107  --  105  CO2 31.2  --  31.5  BUN 9  --  11  CREATININE 0.66  --  0.84  GLU 125  --  121  CALCIUM 8.5  --  8.4*  ALBUMIN 2.4*  --   --   PROT 5.5*  --   --   BILITOT 0.5  --   --   AST 16  --   --   ALT 23  --   --   ALKPHOS 60  --   --   MG 2.3  --  2.1  TSH 2.570 2.301  --   LACTATE 1.6  --   --    Recent Labs    05/16/24 0751  TROPONINI 48*   Recent Labs    05/16/24 0838  WBCUA 22*  NITRITE Positive*  LEUKOCYTESUR Small*  BACTERIA Occasional*  RBCUA 4*  BLOODU Negative  GLUCOSEU Negative  PROTEINUA Negative  KETONESU Negative   No results for input(s): OPIAU, BENZU, TRICYCLIC, PCPU, AMPHU, COCAU, CANNAU, BARBU, ETOH, ACETAMIN, SALICYLATE in the last 72 hours. No results for input(s): PREGTESTUR, PREGPOC in the last 72 hours. No results for input(s): OCCULTBLD, RAPSCRN, CDIFRPCR, CDIFFNAP1, A1C, CHOL, LDL, HDL, TRIG in the last 72 hours. No results for input(s): O2SOUR, FIO2ART, PHART, PCO2ART, PO2ART, HCO3ART, O2SATART, BEART in the last 72 hours. Pending Labs     Order Current Status   Procalcitonin In process   Syphilis Screen In process   Urine Culture In process       Joana JONETTA Hurst, PA Hospitalist, [facility] 05/17/24, 6:35 AM     Attending Addendum  Patient is a 89 y.o. female with PMH of atrial fibrillation, dementia, diabetes mellitus, hypertension, hypothyroidism.  She was  noted to have syncope during hospitalization at [facility] earlier this month and she was admitted for the same to [facility] 1/28 - 1/23.  She was noted to have lethargy at her assisted living and sent here, resolved in the ED. BP reportedly 80s by EMS, 142/85 here.  She was found to have UTI, being treated with levofloxacin due to penicillin allergy. Troponin was elevated similar to last month, suspect chronic leak, no chest pain. Remains in afib with back-up V-pacing. Echo 1/12 with EF 55-60%, mild/mod MR, mild/mod TR, trivial pericardial effusion. CXR no acute pathology, no edema. TSH normal.  She has returned to her mental status baseline.   On  1/25, she had a rapid response.  Nursing had her up to the bedside commode to have a bowel movement when she became less responsive and slumped over, did not fall.  The nurse thinks that she did lose consciousness for a few seconds.  They got her back in bed and noted vitals: BP 58/38, HR 71, satting 97% on room air, afebrile.  Blood glucose 189.  At the time of my arrival to the bedside, her blood pressure was improving to 104/61.  She was alert, at her mental status baseline.  Telemetry strip reviewed, shows A-fib.  EKG A-fib rate 71 stable from prior.  # Syncope - transfer to ICU, I will assume care - bolus 1L NS, pressors if needed - Med rec from [facility] updated today. She did receive a dose of diltiazem  this morning and a higher dose of atenolol  prior to clarifying meds. Diltiazem  has been discontinued and atenolol  decreased to 12.5 mg BID. - Continue on telemetry, plan to interrogate pacemaker - Obtain limited echo for update function - Obtain carotid ultrasound - Cardiology consult tomorrow - draw blood cultures      [1]  Current Facility-Administered Medications:    acetaminophen  (TYLENOL ) suppository 650 mg, 650 mg, Rectal, Q6H PRN, Panwala, Naitik Dhansukh, PA   acetaminophen  (TYLENOL ) tablet 650 mg, 650 mg, Oral, Q6H PRN, Panwala, Naitik  Dhansukh, PA   albuterol 2.5 mg /3 mL (0.083 %) nebulizer solution 2.5 mg, 2.5 mg, Nebulization, Q4H PRN, Verdie Darin Shelvy Lonni, MD   aluminum-magnesium  hydroxide-simethicone (MAALOX MAX) 80-80-8 mg/mL oral suspension, 30 mL, Oral, Q4H PRN, Verdie Darin Shelvy Lonni, MD   atenolol  (TENORMIN ) tablet 50 mg, 50 mg, Oral, Daily, Verdie Darin Shelvy Lonni, MD, 50 mg at 05/16/24 1304   dilTIAZem  (CARDIZEM  CD) 24 hr capsule 120 mg, 120 mg, Oral, Daily, Verdie Darin Shelvy Lonni, MD   fluconazole  (DIFLUCAN ) tablet 200 mg, 200 mg, Oral, Daily, Verdie Darin Shelvy Lonni, MD, 200 mg at 05/16/24 0831   guaiFENesin (ROBITUSSIN) oral syrup, 200 mg, Oral, Q4H PRN, Verdie Darin Shelvy Lonni, MD   [START ON 05/18/2024] levoFLOXacin (LEVAQUIN) tablet 750 mg, 750 mg, Oral, Every other day (0600), Panwala, Naitik Dhansukh, PA   levothyroxine  (SYNTHROID ) tablet 100 mcg, 100 mcg, Oral, daily, Verdie Darin Shelvy Lonni, MD, 100 mcg at 05/17/24 0540   melatonin tablet 3 mg, 3 mg, Oral, Nightly PRN, Verdie Darin Shelvy Lonni, MD   morphine  4 mg/mL injection 2 mg, 2 mg, Intravenous, Q4H PRN, Verdie Darin Shelvy Lonni, MD   naloxone (NARCAN) injection 0.1 mg, 0.1 mg, Intravenous, Q5 Min PRN, Verdie Darin Shelvy Lonni, MD   ondansetron  (ZOFRAN ) injection 4 mg, 4 mg, Intravenous, Q8H PRN **OR** ondansetron  (ZOFRAN ) injection 8 mg, 8 mg, Intravenous, Q8H PRN, Verdie Darin Shelvy Lonni, MD   pantoprazole (Protonix) EC tablet 40 mg, 40 mg, Oral, Daily before breakfast, Verdie Darin Shelvy Lonni, MD, 40 mg at 05/16/24 0831   polyethylene glycol (MIRALAX ) packet 17 g, 17 g, Oral, Daily PRN, Verdie Darin Shelvy Lonni, MD, 17 g at 05/16/24 2051   traMADol  (ULTRAM ) tablet 50 mg, 50 mg, Oral, Q4H PRN, Verdie Darin Shelvy Lonni, MD [2] Past Medical History: Diagnosis Date   A-fib (CMS-HCC)    Dementia (CMS-HCC)    Disease of thyroid  gland    Heat syncope, subsequent encounter     Hyperlipidemia    Hypertension   [3] No past surgical history on file. [4] History reviewed. No pertinent family history. "
